# Patient Record
Sex: Female | Born: 1990
Health system: Southern US, Community
[De-identification: ages and names within clinical notes are randomized; demographics above are authoritative.]

## PROBLEM LIST (undated history)

## (undated) ENCOUNTER — Inpatient Hospital Stay (HOSPITAL_COMMUNITY): Payer: Self-pay

## (undated) DIAGNOSIS — E669 Obesity, unspecified: Secondary | ICD-10-CM

## (undated) DIAGNOSIS — K8020 Calculus of gallbladder without cholecystitis without obstruction: Secondary | ICD-10-CM

## (undated) DIAGNOSIS — A599 Trichomoniasis, unspecified: Secondary | ICD-10-CM

## (undated) HISTORY — PX: NO PAST SURGERIES: SHX2092

---
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
## 2011-04-13 ENCOUNTER — Emergency Department (INDEPENDENT_AMBULATORY_CARE_PROVIDER_SITE_OTHER)
Admission: EM | Admit: 2011-04-13 | Discharge: 2011-04-13 | Disposition: A | Discharge status: Home / Self Care | Payer: Self-pay | Source: Home / Self Care | Attending: Family Medicine | Admitting: Family Medicine

## 2011-04-13 ENCOUNTER — Encounter (HOSPITAL_COMMUNITY): Payer: Self-pay | Admitting: *Deleted

## 2011-04-13 DIAGNOSIS — A499 Bacterial infection, unspecified: Secondary | ICD-10-CM

## 2011-04-13 DIAGNOSIS — N760 Acute vaginitis: Secondary | ICD-10-CM

## 2011-04-13 DIAGNOSIS — B9689 Other specified bacterial agents as the cause of diseases classified elsewhere: Secondary | ICD-10-CM

## 2011-04-13 LAB — POCT URINALYSIS DIP (DEVICE)
Glucose, UA: NEGATIVE mg/dL
Nitrite: NEGATIVE
Urobilinogen, UA: 0.2 mg/dL (ref 0.0–1.0)

## 2011-04-13 LAB — POCT PREGNANCY, URINE: Preg Test, Ur: NEGATIVE

## 2011-04-13 LAB — WET PREP, GENITAL

## 2011-04-13 MED ORDER — FLUCONAZOLE 150 MG PO TABS: 150.0000 mg | ORAL_TABLET | Freq: Once | ORAL | Status: AC

## 2011-04-13 MED ORDER — METRONIDAZOLE 500 MG PO TABS: 500.0000 mg | ORAL_TABLET | Freq: Two times a day (BID) | ORAL | Status: AC

## 2011-04-13 NOTE — ED Provider Notes (Signed)
History     CSN: [id number]  Arrival date & time 04/13/11  0828   First MD Initiated Contact with Patient 04/13/11 [phone]      Chief Complaint  Patient presents with  . SEXUALLY TRANSMITTED DISEASE    (Consider location/radiation/quality/duration/timing/severity/associated sxs/prior treatment) HPI Comments: Alyssa Goodman presents for evaluation for a pregnancy test and for vaginal discharge. She reports unprotected sexual intercourse with a known acquaintance on January 5. She denies any dysuria, abdominal pain. No fever. She does report minor vaginal spotting. Her last menstrual period was January 20.  Patient is a 21 y.o. female presenting with vaginal discharge. The history is provided by the patient.  Vaginal Discharge This is a new problem. The current episode started more than 1 week ago. The problem has not changed since onset.The symptoms are aggravated by nothing. The symptoms are relieved by nothing.    Past Medical History  Diagnosis Date  . Asthma     History reviewed. No pertinent past surgical history.  No family history on file.  History  Substance Use Topics  . Smoking status: Never Smoker   . Smokeless tobacco: Not on file  . Alcohol Use: No    OB History    Grav Para Term Preterm Abortions TAB SAB Ect Mult Living                  Review of Systems  Constitutional: Negative.   HENT: Negative.   Eyes: Negative.   Respiratory: Negative.   Cardiovascular: Negative.   Gastrointestinal: Negative.   Genitourinary: Positive for vaginal discharge. Negative for dysuria, urgency, frequency, hematuria and vaginal pain.  Musculoskeletal: Negative.   Skin: Negative.   Neurological: Negative.     Allergies  Review of patient's allergies indicates no known allergies.  Home Medications   Current Outpatient Rx  Name Route Sig Dispense Refill  . ALBUTEROL SULFATE HFA IN Inhalation Inhale into the lungs as needed.    Marland Kitchen FLUCONAZOLE 150 MG PO TABS Oral Take 1 tablet  (150 mg total) by mouth once. Take one pill once. May repeat if symptoms persist after 3rd day. 2 tablet 0  . METRONIDAZOLE 500 MG PO TABS Oral Take 1 tablet (500 mg total) by mouth 2 (two) times daily. 14 tablet 0    BP 128/81  Pulse 75  Temp(Src) 97.8 F (36.6 C) (Oral)  Resp 16  SpO2 98%  LMP 03/27/2011  Physical Exam  Nursing note and vitals reviewed. Constitutional: She is oriented to person, place, and time. She appears well-developed and well-nourished.  HENT:  Head: Normocephalic and atraumatic.  Eyes: EOM are normal.  Neck: Normal range of motion.  Pulmonary/Chest: Effort normal.  Genitourinary: Cervix exhibits no discharge. Vaginal discharge found.  Musculoskeletal: Normal range of motion.  Neurological: She is alert and oriented to person, place, and time.  Skin: Skin is warm and dry.  Psychiatric: Her behavior is normal.    ED Course  Procedures (including critical care time)  Labs Reviewed  POCT URINALYSIS DIP (DEVICE) - Abnormal; Notable for the following:    Hgb urine dipstick SMALL (*)    Protein, ur 30 (*)    Leukocytes, UA SMALL (*) Biochemical Testing Only. Please order routine urinalysis from main lab if confirmatory testing is needed.   All other components within normal limits  POCT PREGNANCY, URINE  GC/CHLAMYDIA PROBE AMP, GENITAL  WET PREP, GENITAL   No results found.   1. Bacterial vaginosis       MDM  Empirically  treated with metronidazole; labs and cultures pending        Richardo Priest, MD 04/13/11 [phone]

## 2011-04-13 NOTE — ED Notes (Signed)
Pt states she wants to be checked for pregnancy and STD.  States she has been intermittently spotting for past week and also has a vaginal discharge with a slight odor.  Sexually active without use of condoms/birth control.  Denies dysuria or low abd pain.

## 2011-04-14 LAB — GC/CHLAMYDIA PROBE AMP, GENITAL: GC Probe Amp, Genital: NEGATIVE

## 2011-04-14 NOTE — ED Notes (Signed)
GC/Chlamydia neg., Wet prep: Few clue cells, many WBC's.  Pt. adequately treated with Flagyl.  Vassie Moselle 04/14/2011

## 2011-07-27 ENCOUNTER — Emergency Department (HOSPITAL_BASED_OUTPATIENT_CLINIC_OR_DEPARTMENT_OTHER)
Admission: EM | Admit: 2011-07-27 | Discharge: 2011-07-27 | Disposition: A | Discharge status: Home / Self Care | Payer: Medicaid Other | Attending: Emergency Medicine | Admitting: Emergency Medicine

## 2011-07-27 ENCOUNTER — Encounter (HOSPITAL_BASED_OUTPATIENT_CLINIC_OR_DEPARTMENT_OTHER): Payer: Self-pay

## 2011-07-27 DIAGNOSIS — J45909 Unspecified asthma, uncomplicated: Secondary | ICD-10-CM | POA: Insufficient documentation

## 2011-07-27 DIAGNOSIS — A599 Trichomoniasis, unspecified: Secondary | ICD-10-CM | POA: Insufficient documentation

## 2011-07-27 DIAGNOSIS — N390 Urinary tract infection, site not specified: Secondary | ICD-10-CM

## 2011-07-27 LAB — URINE MICROSCOPIC-ADD ON

## 2011-07-27 LAB — URINALYSIS, ROUTINE W REFLEX MICROSCOPIC
Glucose, UA: NEGATIVE mg/dL
Specific Gravity, Urine: 1.028 (ref 1.005–1.030)
Urobilinogen, UA: 0.2 mg/dL (ref 0.0–1.0)
pH: 6 (ref 5.0–8.0)

## 2011-07-27 LAB — PREGNANCY, URINE: Preg Test, Ur: NEGATIVE

## 2011-07-27 LAB — WET PREP, GENITAL

## 2011-07-27 MED ORDER — METRONIDAZOLE 500 MG PO TABS
2000.0000 mg | ORAL_TABLET | Freq: Once | ORAL | Status: AC
  Administered 2011-07-27: 2000 mg via ORAL
  Filled 2011-07-27: qty 4

## 2011-07-27 MED ORDER — CEFTRIAXONE SODIUM 250 MG IJ SOLR
250.0000 mg | Freq: Once | INTRAMUSCULAR | Status: AC
  Administered 2011-07-27: 250 mg via INTRAMUSCULAR
  Filled 2011-07-27: qty 250

## 2011-07-27 MED ORDER — AZITHROMYCIN 250 MG PO TABS
1000.0000 mg | ORAL_TABLET | Freq: Once | ORAL | Status: AC
  Administered 2011-07-27: 1000 mg via ORAL
  Filled 2011-07-27: qty 4

## 2011-07-27 MED ORDER — SULFAMETHOXAZOLE-TRIMETHOPRIM 800-160 MG PO TABS: 1.0000 | ORAL_TABLET | Freq: Two times a day (BID) | ORAL | Status: AC

## 2011-07-27 NOTE — ED Provider Notes (Signed)
History     CSN: [id number]  Arrival date & time 07/27/11  1335   First MD Initiated Contact with Patient 07/27/11 1344      Chief Complaint  Patient presents with  . Dysuria    (Consider location/radiation/quality/duration/timing/severity/associated sxs/prior treatment) HPI Comments: Pt states that she has had a smell and a discharge for a while Pt states that she has not been sexually active in a couple of minutes  Patient is a 21 y.o. female presenting with dysuria. The history is provided by the patient. No language interpreter was used.  Dysuria  This is a new problem. The current episode started 2 days ago. The problem occurs intermittently. The problem has been resolved. The quality of the pain is described as burning. The patient is experiencing no pain. There has been no fever. She is sexually active. Associated symptoms include discharge.    Past Medical History  Diagnosis Date  . Asthma     History reviewed. No pertinent past surgical history.  No family history on file.  History  Substance Use Topics  . Smoking status: Never Smoker   . Smokeless tobacco: Not on file  . Alcohol Use: No    OB History    Grav Para Term Preterm Abortions TAB SAB Ect Mult Living                  Review of Systems  Respiratory: Negative.   Cardiovascular: Negative.   Genitourinary: Positive for dysuria.    Allergies  Review of patient's allergies indicates no known allergies.  Home Medications   Current Outpatient Rx  Name Route Sig Dispense Refill  . ALBUTEROL SULFATE HFA IN Inhalation Inhale into the lungs as needed.      BP 128/77  Pulse 74  Temp(Src) 98.3 F (36.8 C) (Oral)  Resp 16  SpO2 100%  LMP 06/27/2011  Physical Exam  Nursing note and vitals reviewed. Constitutional: She is oriented to person, place, and time. She appears well-developed and well-nourished.  HENT:  Head: Normocephalic and atraumatic.  Eyes: Conjunctivae and EOM are normal.    Neck: Neck supple.  Cardiovascular: Normal rate and regular rhythm.   Pulmonary/Chest: Effort normal and breath sounds normal.  Abdominal: Soft. Bowel sounds are normal. There is no tenderness.  Genitourinary:       Yellow vaginal discharge:-cmt  Musculoskeletal: Normal range of motion.  Neurological: She is alert and oriented to person, place, and time.    ED Course  Procedures (including critical care time)  Labs Reviewed  URINALYSIS, ROUTINE W REFLEX MICROSCOPIC - Abnormal; Notable for the following:    APPearance TURBID (*)    Hgb urine dipstick MODERATE (*)    Protein, ur 30 (*)    Leukocytes, UA LARGE (*)    All other components within normal limits  WET PREP, GENITAL - Abnormal; Notable for the following:    Trich, Wet Prep FEW (*)    Clue Cells Wet Prep HPF POC FEW (*)    WBC, Wet Prep HPF POC TOO NUMEROUS TO COUNT (*)    All other components within normal limits  URINE MICROSCOPIC-ADD ON - Abnormal; Notable for the following:    Squamous Epithelial / LPF MANY (*)    Bacteria, UA MANY (*)    All other components within normal limits  PREGNANCY, URINE  GC/CHLAMYDIA PROBE AMP, GENITAL   No results found.   1. Trichimoniasis   2. UTI (lower urinary tract infection)  MDM  Pt treated for std here based on exam:educated on std        Teressa Lower, NP 07/27/11 1456

## 2011-07-27 NOTE — ED Notes (Signed)
Dysuria 2 days ago-denies at present

## 2011-07-27 NOTE — Discharge Instructions (Signed)
Urinary Tract Infection Infections of the urinary tract can start in several places. A bladder infection (cystitis), a kidney infection (pyelonephritis), and a prostate infection (prostatitis) are different types of urinary tract infections (UTIs). They usually get better if treated with medicines (antibiotics) that kill germs. Take all the medicine until it is gone. You or your child may feel better in a few days, but TAKE ALL MEDICINE or the infection may not respond and may become more difficult to treat. HOME CARE INSTRUCTIONS   Drink enough water and fluids to keep the urine clear or pale yellow. Cranberry juice is especially recommended, in addition to large amounts of water.   Avoid caffeine, tea, and carbonated beverages. They tend to irritate the bladder.   Alcohol may irritate the prostate.   Only take over-the-counter or prescription medicines for pain, discomfort, or fever as directed by your caregiver.  To prevent further infections:  Empty the bladder often. Avoid holding urine for long periods of time.   After a bowel movement, women should cleanse from front to back. Use each tissue only once.   Empty the bladder before and after sexual intercourse.  FINDING OUT THE RESULTS OF YOUR TEST Not all test results are available during your visit. If your or your child's test results are not back during the visit, make an appointment with your caregiver to find out the results. Do not assume everything is normal if you have not heard from your caregiver or the medical facility. It is important for you to follow up on all test results. SEEK MEDICAL CARE IF:   There is back pain.   Your baby is older than 3 months with a rectal temperature of 100.5 F (38.1 C) or higher for more than 1 day.   Your or your child's problems (symptoms) are no better in 3 days. Return sooner if you or your child is getting worse.  SEEK IMMEDIATE MEDICAL CARE IF:   There is severe back pain or lower  abdominal pain.   You or your child develops chills.   You have a fever.   Your baby is older than 3 months with a rectal temperature of 102 F (38.9 C) or higher.   Your baby is 3 months old or younger with a rectal temperature of 100.4 F (38 C) or higher.   There is nausea or vomiting.   There is continued burning or discomfort with urination.  MAKE SURE YOU:   Understand these instructions.   Will watch your condition.   Will get help right away if you are not doing well or get worse.  Document Released: 12/01/2004 Document Revised: 02/10/2011 Document Reviewed: 07/06/2006 [employer] Patient Information 2012 [employer]  Trichomoniasis Trichomoniasis is an infection, caused by the Trichomonas organism, that affects both women and men. In women, the outer female genitalia and the vagina are affected. In men, the penis is mainly affected, but the prostate and other reproductive organs can also be involved. Trichomoniasis is a sexually transmitted disease (STD) and is most often passed to another person through sexual contact. The majority of people who get trichomoniasis do so from a sexual encounter and are also at risk for other STDs. CAUSES   Sexual intercourse with an infected partner.   It can be present in swimming pools or hot tubs.  SYMPTOMS   Abnormal gray-green frothy vaginal discharge in women.   Vaginal itching and irritation in women.   Itching and irritation of the area outside the vagina   in women.   Penile discharge with or without pain in males.   Inflammation of the urethra (urethritis), causing painful urination.   Bleeding after sexual intercourse.  RELATED COMPLICATIONS  Pelvic inflammatory disease.   Infection of the uterus (endometritis).   Infertility.   Tubal (ectopic) pregnancy.   It can be associated with other STDs, including gonorrhea and chlamydia, hepatitis B, and HIV.  COMPLICATIONS DURING PREGNANCY  Early (premature)  delivery.   Premature rupture of the membranes (PROM).   Low birth weight.  DIAGNOSIS   Visualization of Trichomonas under the microscope from the vagina discharge.   Ph of the vagina greater than 4.5, tested with a test tape.   Trich Rapid Test.   Culture of the organism, but this is not usually needed.   It may be found on a Pap test.   Having a "strawberry cervix,"which means the cervix looks very red like a strawberry.  TREATMENT   You may be given medication to fight the infection. Inform your caregiver if you could be or are pregnant. Some medications used to treat the infection should not be taken during pregnancy.   Over-the-counter medications or creams to decrease itching or irritation may be recommended.   Your sexual partner will need to be treated if infected.  HOME CARE INSTRUCTIONS   Take all medication prescribed by your caregiver.   Take over-the-counter medication for itching or irritation as directed by your caregiver.   Do not have sexual intercourse while you have the infection.   Do not douche or wear tampons.   Discuss your infection with your partner, as your partner may have acquired the infection from you. Or, your partner may have been the person who transmitted the infection to you.   Have your sex partner examined and treated if necessary.   Practice safe, informed, and protected sex.   See your caregiver for other STD testing.  SEEK MEDICAL CARE IF:   You still have symptoms after you finish the medication.   You have an oral temperature above 102 F (38.9 C).   You develop belly (abdominal) pain.   You have pain when you urinate.   You have bleeding after sexual intercourse.   You develop a rash.   The medication makes you sick or makes you throw up (vomit).  Document Released: 08/17/2000 Document Revised: 02/10/2011 Document Reviewed: 09/12/2008 [employer] Patient Information 2012 [employer]. 

## 2011-07-28 LAB — GC/CHLAMYDIA PROBE AMP, GENITAL: GC Probe Amp, Genital: NEGATIVE

## 2011-07-29 NOTE — ED Provider Notes (Signed)
Medical screening examination/treatment/procedure(s) were performed by non-physician practitioner and as supervising physician I was immediately available for consultation/collaboration.  Sheriece Jefcoat, MD 07/29/11 0537 

## 2012-05-19 ENCOUNTER — Emergency Department (HOSPITAL_COMMUNITY)
Admission: EM | Admit: 2012-05-19 | Discharge: 2012-05-19 | Disposition: A | Discharge status: Home / Self Care | Payer: Medicaid Other | Attending: Emergency Medicine | Admitting: Emergency Medicine

## 2012-05-19 ENCOUNTER — Encounter (HOSPITAL_COMMUNITY): Payer: Self-pay | Admitting: Emergency Medicine

## 2012-05-19 DIAGNOSIS — J069 Acute upper respiratory infection, unspecified: Secondary | ICD-10-CM | POA: Insufficient documentation

## 2012-05-19 DIAGNOSIS — J45909 Unspecified asthma, uncomplicated: Secondary | ICD-10-CM | POA: Insufficient documentation

## 2012-05-19 DIAGNOSIS — R51 Headache: Secondary | ICD-10-CM | POA: Insufficient documentation

## 2012-05-19 LAB — RAPID STREP SCREEN (MED CTR MEBANE ONLY): Streptococcus, Group A Screen (Direct): NEGATIVE

## 2012-05-19 MED ORDER — METOCLOPRAMIDE HCL 10 MG PO TABS: 10.0000 mg | ORAL_TABLET | Freq: Four times a day (QID) | ORAL | Status: DC | PRN

## 2012-05-19 MED ORDER — OXYCODONE-ACETAMINOPHEN 5-325 MG PO TABS: 2.0000 | ORAL_TABLET | ORAL | Status: DC | PRN

## 2012-05-19 MED ORDER — ONDANSETRON 8 MG PO TBDP: ORAL_TABLET | ORAL | Status: DC

## 2012-05-19 MED ORDER — OXYCODONE-ACETAMINOPHEN 5-325 MG PO TABS
2.0000 | ORAL_TABLET | Freq: Once | ORAL | Status: AC
  Administered 2012-05-19: 2 via ORAL
  Filled 2012-05-19: qty 2

## 2012-05-19 MED ORDER — PREDNISONE 20 MG PO TABS: ORAL_TABLET | ORAL | Status: DC

## 2012-05-19 NOTE — ED Notes (Signed)
Pt c/o sore throat and headache x's 3 days.  Nausea without vomiting

## 2012-05-19 NOTE — ED Provider Notes (Signed)
History     CSN: [id number]  Arrival date & time 05/19/12  1757   First MD Initiated Contact with Patient 05/19/12 2029      Chief Complaint  Patient presents with  . Sore Throat    (Consider location/radiation/quality/duration/timing/severity/associated sxs/prior treatment) HPI This 22 year old female has a few days of a sore throat nasal congestion cough generalized body aches generalized weakness and nausea without vomiting without abdominal pain without diarrhea , she does have a mild headache but no severe headache no stiff neck no confusion no change in speech or vision no focal weakness or numbness no rashes no shortness of breath and she is able to tolerate oral fluids. There is no treatment prior to arrival. Past Medical History  Diagnosis Date  . Asthma     History reviewed. No pertinent past surgical history.  No family history on file.  History  Substance Use Topics  . Smoking status: Never Smoker   . Smokeless tobacco: Not on file  . Alcohol Use: No    OB History   Grav Para Term Preterm Abortions TAB SAB Ect Mult Living                  Review of Systems 10 Systems reviewed and are negative for acute change except as noted in the HPI. Allergies  Review of patient's allergies indicates no known allergies.  Home Medications   Current Outpatient Rx  Name  Route  Sig  Dispense  Refill  . metoCLOPramide (REGLAN) 10 MG tablet   Oral   Take 1 tablet (10 mg total) by mouth every 6 (six) hours as needed (nausea/headache).   6 tablet   0   . ondansetron (ZOFRAN ODT) 8 MG disintegrating tablet      8mg  ODT q4 hours prn nausea   4 tablet   0   . oxyCODONE-acetaminophen (PERCOCET) 5-325 MG per tablet   Oral   Take 2 tablets by mouth every 4 (four) hours as needed for pain.   20 tablet   0   . predniSONE (DELTASONE) 20 MG tablet      3 tabs po daily x 2 days   6 tablet   0     BP 131/95  Pulse 89  Temp(Src) 98.3 F (36.8 C) (Oral)  Resp 16   SpO2 96%  LMP 05/03/2012  Physical Exam  Nursing note and vitals reviewed. Constitutional:  Awake, alert, nontoxic appearance.  HENT:  Head: Atraumatic.  Mouth/Throat: No oropharyngeal exudate.  Oropharynx shows some bilateral tonsillar enlargement with erythema but no obvious fluctuance or peritonsillar abscess, uvula is midline there is no stridor no drooling no trismus no airway compromise  Eyes: Right eye exhibits no discharge. Left eye exhibits no discharge.  Neck: Neck supple.  Cardiovascular: Normal rate and regular rhythm.   No murmur heard. Pulmonary/Chest: Effort normal and breath sounds normal. No stridor. No respiratory distress. She has no wheezes. She has no rales. She exhibits no tenderness.  Abdominal: Soft. There is no tenderness. There is no rebound.  Musculoskeletal: She exhibits no tenderness.  Baseline ROM, no obvious new focal weakness.  Lymphadenopathy:    She has no cervical adenopathy.  Neurological:  Mental status and motor strength appears baseline for patient and situation.  Skin: No rash noted.  Psychiatric: She has a normal mood and affect.    ED Course  Procedures (including critical care time)  Labs Reviewed  RAPID STREP SCREEN   No results found.   1.  URI (upper respiratory infection)       MDM  I doubt any other EMC precluding discharge at this time including, but not necessarily limited to the following:SBI, PTA.        Hurman Horn, MD 05/20/12 0110

## 2012-06-27 ENCOUNTER — Ambulatory Visit: Payer: Medicaid Other

## 2012-06-27 ENCOUNTER — Encounter: Payer: Self-pay | Admitting: Internal Medicine

## 2012-06-27 ENCOUNTER — Ambulatory Visit (INDEPENDENT_AMBULATORY_CARE_PROVIDER_SITE_OTHER): Payer: Medicaid Other | Admitting: Internal Medicine

## 2012-06-27 ENCOUNTER — Other Ambulatory Visit (HOSPITAL_COMMUNITY)
Admission: RE | Admit: 2012-06-27 | Discharge: 2012-06-27 | Disposition: A | Discharge status: Home / Self Care | Payer: Medicaid Other | Source: Ambulatory Visit | Attending: Internal Medicine | Admitting: Internal Medicine

## 2012-06-27 VITALS — BP 117/83 | HR 66 | Temp 97.4°F | Ht 61.0 in | Wt 274.7 lb

## 2012-06-27 DIAGNOSIS — Z113 Encounter for screening for infections with a predominantly sexual mode of transmission: Secondary | ICD-10-CM | POA: Insufficient documentation

## 2012-06-27 DIAGNOSIS — J45909 Unspecified asthma, uncomplicated: Secondary | ICD-10-CM

## 2012-06-27 DIAGNOSIS — N898 Other specified noninflammatory disorders of vagina: Secondary | ICD-10-CM | POA: Insufficient documentation

## 2012-06-27 DIAGNOSIS — R51 Headache: Secondary | ICD-10-CM

## 2012-06-27 DIAGNOSIS — Z3202 Encounter for pregnancy test, result negative: Secondary | ICD-10-CM

## 2012-06-27 DIAGNOSIS — N760 Acute vaginitis: Secondary | ICD-10-CM | POA: Insufficient documentation

## 2012-06-27 DIAGNOSIS — Z0000 Encounter for general adult medical examination without abnormal findings: Secondary | ICD-10-CM

## 2012-06-27 DIAGNOSIS — G44209 Tension-type headache, unspecified, not intractable: Secondary | ICD-10-CM | POA: Insufficient documentation

## 2012-06-27 MED ORDER — AZITHROMYCIN 500 MG PO TABS: 1000.0000 mg | ORAL_TABLET | Freq: Once | ORAL | Status: DC

## 2012-06-27 MED ORDER — AZITHROMYCIN 500 MG PO TABS: 1000.0000 mg | ORAL_TABLET | Freq: Once | ORAL | Status: AC

## 2012-06-27 NOTE — Patient Instructions (Addendum)
**I will have the results from your vaginal swab hopefully by tomorrow, and will give you a call once I get the results. In the mean time, take Azithromycin 1000mg  by mouth, once.   **Once you have your [facility], I will perform pap smear and full pelvic exam, do blood work, and check your pulmonary function.    Asthma Attack Prevention HOW CAN ASTHMA BE PREVENTED? Currently, there is no way to prevent asthma from starting. However, you can take steps to control the disease and prevent its symptoms after you have been diagnosed. Learn about your asthma and how to control it. Take an active role to control your asthma by working with your caregiver to create and follow an asthma action plan. An asthma action plan guides you in taking your medicines properly, avoiding factors that make your asthma worse, tracking your level of asthma control, responding to worsening asthma, and seeking emergency care when needed. To track your asthma, keep records of your symptoms, check your peak flow number using a peak flow meter (handheld device that shows how well air moves out of your lungs), and get regular asthma checkups.  Other ways to prevent asthma attacks include:  Use medicines as your caregiver directs.  Identify and avoid things that make your asthma worse (as much as you can).  Keep track of your asthma symptoms and level of control.  Get regular checkups for your asthma.  With your caregiver, write a detailed plan for taking medicines and managing an asthma attack. Then be sure to follow your action plan. Asthma is an ongoing condition that needs regular monitoring and treatment.  Identify and avoid asthma triggers. A number of outdoor allergens and irritants (pollen, mold, cold air, air pollution) can trigger asthma attacks. Find out what causes or makes your asthma worse, and take steps to avoid those triggers (see below).  Monitor your breathing. Learn to recognize warning signs of an  attack, such as slight coughing, wheezing or shortness of breath. However, your lung function may already decrease before you notice any signs or symptoms, so regularly measure and record your peak airflow with a home peak flow meter.  Identify and treat attacks early. If you act quickly, you're less likely to have a severe attack. You will also need less medicine to control your symptoms. When your peak flow measurements decrease and alert you to an upcoming attack, take your medicine as instructed, and immediately stop any activity that may have triggered the attack. If your symptoms do not improve, get medical help.  Pay attention to increasing quick-relief inhaler use. If you find yourself relying on your quick-relief inhaler (such as albuterol), your asthma is not under control. See your caregiver about adjusting your treatment. IDENTIFY AND CONTROL FACTORS THAT MAKE YOUR ASTHMA WORSE A number of common things can set off or make your asthma symptoms worse (asthma triggers). Keep track of your asthma symptoms for several weeks, detailing all the environmental and emotional factors that are linked with your asthma. When you have an asthma attack, go back to your asthma diary to see which factor, or combination of factors, might have contributed to it. Once you know what these factors are, you can take steps to control many of them.  Allergies: If you have allergies and asthma, it is important to take asthma prevention steps at home. Asthma attacks (worsening of asthma symptoms) can be triggered by allergies, which can cause temporary increased inflammation of your airways. Minimizing contact with the  substance to which you are allergic will help prevent an asthma attack. Animal Dander:   Some people are allergic to the flakes of skin or dried saliva from animals with fur or feathers. Keep these pets out of your home.  If you can't keep a pet outdoors, keep the pet out of your bedroom and other  sleeping areas at all times, and keep the door closed.  Remove carpets and furniture covered with cloth from your home. If that is not possible, keep the pet away from fabric-covered furniture and carpets. Dust Mites:  Many people with asthma are allergic to dust mites. Dust mites are tiny bugs that are found in every home, in mattresses, pillows, carpets, fabric-covered furniture, bedcovers, clothes, stuffed toys, fabric, and other fabric-covered items.  Cover your mattress in a special dust-proof cover.  Cover your pillow in a special dust-proof cover, or wash the pillow each week in hot water. Water must be hotter than 130 F to kill dust mites. Cold or warm water used with detergent and bleach can also be effective.  Wash the sheets and blankets on your bed each week in hot water.  Try not to sleep or lie on cloth-covered cushions.  Call ahead when traveling and ask for a smoke-free hotel room. Bring your own bedding and pillows, in case the hotel only supplies feather pillows and down comforters, which may contain dust mites and cause asthma symptoms.  Remove carpets from your bedroom and those laid on concrete, if you can.  Keep stuffed toys out of the bed, or wash the toys weekly in hot water or cooler water with detergent and bleach. Cockroaches:  Many people with asthma are allergic to the droppings and remains of cockroaches.  Keep food and garbage in closed containers. Never leave food out.  Use poison baits, traps, powders, gels, or paste (for example, boric acid).  If a spray is used to kill cockroaches, stay out of the room until the odor goes away. Indoor Mold:  Fix leaky faucets, pipes, or other sources of water that have mold around them.  Clean moldy surfaces with a cleaner that has bleach in it. Pollen and Outdoor Mold:  When pollen or mold spore counts are high, try to keep your windows closed.  Stay indoors with windows closed from late morning to afternoon,  if you can. Pollen and some mold spore counts are highest at that time.  Ask your caregiver whether you need to take or increase anti-inflammatory medicine before your allergy season starts. Irritants:   Tobacco smoke is an irritant. If you smoke, ask your caregiver how you can quit. Ask family members to quit smoking, too. Do not allow smoking in your home or car.  If possible, do not use a wood-burning stove, kerosene heater, or fireplace. Minimize exposure to all sources of smoke, including incense, candles, fires, and fireworks.  Try to stay away from strong odors and sprays, such as perfume, talcum powder, hair spray, and paints.  Decrease humidity in your home and use an indoor air cleaning device. Reduce indoor humidity to below 60 percent. Dehumidifiers or central air conditioners can do this.  Try to have someone else vacuum for you once or twice a week, if you can. Stay out of rooms while they are being vacuumed and for a short while afterward.  If you vacuum, use a dust mask from a hardware store, a double-layered or microfilter vacuum cleaner bag, or a vacuum cleaner with a HEPA filter.  Sulfites in foods and beverages can be irritants. Do not drink beer or wine, or eat dried fruit, processed potatoes, or shrimp if they cause asthma symptoms.  Cold air can trigger an asthma attack. Cover your nose and mouth with a scarf on cold or windy days.  Several health conditions can make asthma more difficult to manage, including runny nose, sinus infections, reflux disease, psychological stress, and sleep apnea. Your caregiver will treat these conditions, as well.  Avoid close contact with people who have a cold or the flu, since your asthma symptoms may get worse if you catch the infection from them. Wash your hands thoroughly after touching items that may have been handled by people with a respiratory infection.  Get a flu shot every year to protect against the flu virus, which often  makes asthma worse for days or weeks. Also get a pneumonia shot once every five to 10 years. Drugs:  Aspirin and other painkillers can cause asthma attacks. 10% to 20% of people with asthma have sensitivity to aspirin or a group of painkillers called non-steroidal anti-inflammatory drugs (NSAIDS), such as ibuprofen and naproxen. These drugs are used to treat pain and reduce fevers. Asthma attacks caused by any of these medicines can be severe and even fatal. These drugs must be avoided in people who have known aspirin sensitive asthma. Products with acetaminophen are considered safe for people who have asthma. It is important that people with aspirin sensitivity read labels of all over-the-counter drugs used to treat pain, colds, coughs, and fever.  Beta blockers and ACE inhibitors are other drugs which you should discuss with your caregiver, in relation to your asthma. ALLERGY SKIN TESTING  Ask your asthma caregiver about allergy skin testing or blood testing (RAST test) to identify the allergens to which you are sensitive. If you are found to have allergies, allergy shots (immunotherapy) for asthma may help prevent future allergies and asthma. With allergy shots, small doses of allergens (substances to which you are allergic) are injected under your skin on a regular schedule. Over a period of time, your body may become used to the allergen and less responsive with asthma symptoms. You can also take measures to minimize your exposure to those allergens. EXERCISE  If you have exercise-induced asthma, or are planning vigorous exercise, or exercise in cold, humid, or dry environments, prevent exercise-induced asthma by following your caregiver's advice regarding asthma treatment before exercising. Document Released: 02/09/2009 Document Revised: 05/16/2011 Document Reviewed: 02/09/2009 [facility] Patient Information 2013 [place], [place].

## 2012-06-28 ENCOUNTER — Encounter: Payer: Self-pay | Admitting: Internal Medicine

## 2012-06-28 DIAGNOSIS — Z0000 Encounter for general adult medical examination without abnormal findings: Secondary | ICD-10-CM | POA: Insufficient documentation

## 2012-06-28 MED ORDER — METRONIDAZOLE 500 MG PO TABS: 500.0000 mg | ORAL_TABLET | Freq: Two times a day (BID) | ORAL | Status: AC

## 2012-06-28 NOTE — Assessment & Plan Note (Addendum)
She is applying for the orange card, and once she gets that, will perform a Pap smear so she does not have to pay out of pocket. I will also check labs on her at that time, and she will likely need PFTs, as I'm not sure that she's had any in the past.

## 2012-06-28 NOTE — Progress Notes (Signed)
Patient ID: Alyssa Goodman, female   DOB: 1990/04/09, 22 y.o.   MRN: [id number]  Subjective:   Patient ID: Alyssa Goodman female   DOB: Sep 07, 1990 22 y.o.   MRN: [id number]  HPI: Ms.Alyssa Goodman is a 22 y.o. female with past history of asthma and a history of trichomonas and BV presents to [facility] as a new patient. Next  She's history of asthma and uses only Proventil inhaler as needed, using it about 2 times a week when she feels short of breath or his wheezing. She says her symptoms are worse in the summer when it's hot outside or with exercise. She states that it she cannot members she's ever had PFTs. Next  She does have a history of headaches that began which is a sixth-grade. They're relieved with over-the-counter medication and primarily occur at her left temple. Next  She complains today of vaginal itching and burning with a white cheesy discharge is been occurring for the past few months. She states that she's not currently sexually active her last sexual partner was in December 2013, when she did use a condom. She states that when she has sex she uses condoms. Her last Pap smear was over year ago. She has a history of trichomonas 5/22/13and BV 04/13/11; which were treated with metronidazole.   Past Medical History  Diagnosis Date  . Asthma    Current Outpatient Prescriptions  Medication Sig Dispense Refill  . azithromycin (ZITHROMAX) 500 MG tablet Take 2 tablets (1,000 mg total) by mouth once. Take 1 tablet daily for 3 days.  2 tablet  0  . metoCLOPramide (REGLAN) 10 MG tablet Take 1 tablet (10 mg total) by mouth every 6 (six) hours as needed (nausea/headache).  6 tablet  0  . metroNIDAZOLE (FLAGYL) 500 MG tablet Take 1 tablet (500 mg total) by mouth 2 (two) times daily.  14 tablet  0  . ondansetron (ZOFRAN ODT) 8 MG disintegrating tablet 8mg  ODT q4 hours prn nausea  4 tablet  0  . oxyCODONE-acetaminophen (PERCOCET) 5-325 MG per tablet Take 2 tablets by mouth every 4 (four) hours as needed  for pain.  20 tablet  0  . predniSONE (DELTASONE) 20 MG tablet 3 tabs po daily x 2 days  6 tablet  0   No current facility-administered medications for this visit.   No family history on file. History   Social History  . Marital Status: Single    Spouse Name: N/A    Number of Children: N/A  . Years of Education: N/A   Social History Main Topics  . Smoking status: Former Games developer  . Smokeless tobacco: None  . Alcohol Use: No  . Drug Use: No  . Sexually Active: None   Other Topics Concern  . None   Social History Narrative  . None   Review of Systems: A 10 point ROS was performed; pertinent positives and negatives were noted in the HPI   Objective:  Physical Exam: Filed Vitals:   06/27/12 1042  BP: 117/83  Pulse: 66  Temp: 97.4 F (36.3 C)  TempSrc: Oral  Height: 5\' 1"  (1.549 m)  Weight: 274 lb 11.2 oz (124.603 kg)  SpO2: 99%   Constitutional: Vital signs reviewed.  Patient is a morbidly obese female in no acute distress and cooperative with exam.   Head: Normocephalic and atraumatic Ear: TM normal bilaterally Mouth: no erythema or exudates, MMM Eyes: PERRL, EOMI, conjunctivae normal, No scleral icterus.  Neck: Supple, Trachea midline normal  ROM, No JVD, mass, thyromegaly Cardiovascular: RRR, S1 normal, S2 normal, no MRG, pulses symmetric and intact bilaterally Pulmonary/Chest: CTAB, no wheezes, rales, or rhonchi Abdominal: Obese. Soft. Non-tender, non-distended, no masses, organomegaly, or guarding present.  GU: Foul smelling, thin, yellow discharge from vagina Musculoskeletal: No joint deformities, erythema, or stiffness, ROM full and no nontender Hematology: No cervical adenopathy.  Neurological: A&O x3, Strength is normal and symmetric bilaterally, cranial nerve II-XII are grossly intact, no focal motor deficit, sensory intact to light touch bilaterally.  Skin: Warm, dry and intact. No rash, cyanosis, or clubbing.  Psychiatric: Normal mood and affect.    Assessment & Plan:   Please refer to Problem List based Assessment and Plan

## 2012-06-28 NOTE — Assessment & Plan Note (Addendum)
Per patient, appears to be well controlled with over-the-counter medication. Continue to monitor.

## 2012-06-28 NOTE — Assessment & Plan Note (Signed)
Patient is morbidly obese. She does not perform exercise. I will refer her to [facility] for nutrition counseling once she has the orange card.

## 2012-06-28 NOTE — Progress Notes (Signed)
Patient ID: Alyssa Goodman, female   DOB: 30-Jun-1990, 22 y.o.   MRN: [id number] I called Ms. Schrecengost and left a message on her personal cell phone regarding her wet prep findings positive for BV and Trichomonas. I notified that I e-prescribed Flagly 500mg  po BID x7 days, and that she should contact her last sexual partner regarding the Trichomonas, as it is an STD.

## 2012-06-28 NOTE — Assessment & Plan Note (Signed)
Currently uses an albuterol inhaler around 2 times a week for shortness of breath or wheezing. Since her symptoms are worse in the summer with the heat or with exercise. She states she rarely has nocturnal symptoms. Because her asthma appears to be well-controlled at this time we'll continue with the albuterol inhaler as needed.

## 2012-06-28 NOTE — Assessment & Plan Note (Addendum)
Patient complains of white cheesy discharge, however on exam discharge was within and yellowish in color. Wet prep was ordered and GC and Chlamydia urinalysis was checked as well. While tests were negative for gonorrhea Chlamydia, wet prep was positive for trichomonas and Gardnerella vaginosis. She was treated prophylactically with 1 g of azithromycin x1 and told to wear cotton underwear and looser fitting clothes. Pregnancy test was negative this visit.  The results from the wet prep came back on 4/24, so I will notify the patient and call in some metronidazole for her to treat the Trichomonas and BV. - Flagyl 500 mg twice daily for seven days

## 2012-07-09 NOTE — Progress Notes (Signed)
INTERNAL MEDICINE TEACHING ATTENDING ADDENDUM - Alyssa Catalina, MD: I reviewed with the resident Dr. Sherrine Maples, Ms. Devinney'  medical history, physical examination, diagnosis and results of tests and treatment and I agree with the patient's care as documented.  Awaiting insurance approval to do further health maintenance and PFTs.

## 2012-07-12 ENCOUNTER — Other Ambulatory Visit (HOSPITAL_COMMUNITY)
Admission: RE | Admit: 2012-07-12 | Discharge: 2012-07-12 | Disposition: A | Discharge status: Home / Self Care | Payer: Medicaid Other | Source: Ambulatory Visit | Attending: Internal Medicine | Admitting: Internal Medicine

## 2012-07-12 ENCOUNTER — Ambulatory Visit (INDEPENDENT_AMBULATORY_CARE_PROVIDER_SITE_OTHER): Payer: Medicaid Other | Admitting: Internal Medicine

## 2012-07-12 ENCOUNTER — Encounter: Payer: Self-pay | Admitting: Internal Medicine

## 2012-07-12 VITALS — BP 121/79 | HR 71 | Temp 97.1°F | Ht 61.0 in | Wt 276.5 lb

## 2012-07-12 DIAGNOSIS — N898 Other specified noninflammatory disorders of vagina: Secondary | ICD-10-CM

## 2012-07-12 DIAGNOSIS — R87810 Cervical high risk human papillomavirus (HPV) DNA test positive: Secondary | ICD-10-CM | POA: Insufficient documentation

## 2012-07-12 DIAGNOSIS — Z0000 Encounter for general adult medical examination without abnormal findings: Secondary | ICD-10-CM

## 2012-07-12 DIAGNOSIS — Z113 Encounter for screening for infections with a predominantly sexual mode of transmission: Secondary | ICD-10-CM | POA: Insufficient documentation

## 2012-07-12 DIAGNOSIS — N760 Acute vaginitis: Secondary | ICD-10-CM | POA: Insufficient documentation

## 2012-07-12 DIAGNOSIS — Z1151 Encounter for screening for human papillomavirus (HPV): Secondary | ICD-10-CM | POA: Insufficient documentation

## 2012-07-12 DIAGNOSIS — Z01419 Encounter for gynecological examination (general) (routine) without abnormal findings: Secondary | ICD-10-CM | POA: Insufficient documentation

## 2012-07-12 DIAGNOSIS — J45909 Unspecified asthma, uncomplicated: Secondary | ICD-10-CM

## 2012-07-12 LAB — GLUCOSE, CAPILLARY: Glucose-Capillary: 80 mg/dL (ref 70–99)

## 2012-07-12 NOTE — Progress Notes (Signed)
Patient ID: Alyssa Goodman, female   DOB: 02/09/1991, 22 y.o.   MRN: [id number]  Subjective:   Patient ID: Alyssa Goodman female   DOB: 24-Aug-1990 22 y.o.   MRN: [id number]  HPI: Ms.Alyssa Goodman is a 22 y.o. F with PMH asthma and recently treated for BV and trichomnas presents to [facility] for a f/u appt.   Pt states that she is doing well overall. Still with some vaginal d/c which has improved.   Using albuterol inh 2x week 2/2 SOB/wheezing.  Brought paperwork for [facility] card today, but deb [place] out until Monday, so she will return then.   Past Medical History  Diagnosis Date  . Asthma    Current Outpatient Prescriptions  Medication Sig Dispense Refill  . albuterol (PROVENTIL HFA;VENTOLIN HFA) 108 (90 BASE) MCG/ACT inhaler Inhale 2 puffs into the lungs every 6 (six) hours as needed for wheezing.      Marland Kitchen oxyCODONE-acetaminophen (PERCOCET) 5-325 MG per tablet Take 2 tablets by mouth every 4 (four) hours as needed for pain.  20 tablet  0  . predniSONE (DELTASONE) 20 MG tablet 3 tabs po daily x 2 days  6 tablet  0   No current facility-administered medications for this visit.   No family history on file. History   Social History  . Marital Status: Single    Spouse Name: N/A    Number of Children: N/A  . Years of Education: N/A   Social History Main Topics  . Smoking status: Former Games developer  . Smokeless tobacco: None  . Alcohol Use: No  . Drug Use: No  . Sexually Active: None   Other Topics Concern  . None   Social History Narrative  . None   Review of Systems: A 10 point ROS was performed; pertinent positives and negatives were noted in the HPI   Objective:  Physical Exam: Filed Vitals:   07/12/12 1428  BP: 121/79  Pulse: 71  Temp: 97.1 F (36.2 C)  TempSrc: Oral  Height: 5\' 1"  (1.549 m)  Weight: 276 lb 8 oz (125.42 kg)  SpO2: 98%   Constitutional: Vital signs reviewed.  Patient is a well-developed and well-nourished female in no acute distress and cooperative with  exam.  Head: Normocephalic and atraumatic Mouth: no erythema or exudates, MMM Eyes: PERRL, EOMI, conjunctivae normal, No scleral icterus.  Cardiovascular: RRR, S1 normal, S2 normal, no MRG, pulses symmetric and intact bilaterally Pulmonary/Chest: CTAB, no wheezes, rales, or rhonchi Abdominal: Obese. Soft. Non-tender, non-distended, bowel sounds are normal, GU: Tenderness on pelvic exam, pt having difficulty relaxing, thick yellow d/c w/o odor in vaginal canal. Difficulty visualizing cervix. Musculoskeletal: No joint deformities, erythema, or stiffness, ROM full and no nontender Neurological: A&O x3, non-focal Skin: Warm, dry and intact. No rash, cyanosis, or clubbing.  Psychiatric: Normal mood and affect.    Assessment & Plan:   Please refer to Problem List based Assessment and Plan

## 2012-07-12 NOTE — Assessment & Plan Note (Addendum)
Treated with Flagyl 500mg  BID x7 days. Vaginal d/c improved, no odor but some d/c present. Some tenderness on exam, pt very tense and nervous- difficulty relaxing. Cytology, wet prep, and GC/Chlamydia performed.  Addendum: GC/Chlamydia, trichomonas, BV, and yeast all negative.  ASC-US on pap smear. Pt will repeat pap smear in 12 months. Patient notified.

## 2012-07-12 NOTE — Assessment & Plan Note (Signed)
Checked CBG which was 81 today. BP good at 121/73

## 2012-07-12 NOTE — Assessment & Plan Note (Signed)
Checked CBG which was 81 today. BP good at 121/73 

## 2012-07-12 NOTE — Patient Instructions (Signed)
**   I will call you with the results of you testing today. If you do not hear from me within 1 week, please call the clinic.

## 2012-07-18 NOTE — Progress Notes (Signed)
Case discussed with Dr. Sherrine Maples (at time of visit, soon after the resident saw the patient).  We reviewed the resident's history and exam and pertinent patient test results.  I agree with the assessment, diagnosis, and plan of care documented in the resident's note.

## 2012-10-13 ENCOUNTER — Encounter (HOSPITAL_COMMUNITY): Payer: Self-pay | Admitting: *Deleted

## 2012-10-13 ENCOUNTER — Emergency Department (HOSPITAL_COMMUNITY)
Admission: EM | Admit: 2012-10-13 | Discharge: 2012-10-13 | Disposition: A | Discharge status: Home / Self Care | Payer: Self-pay | Attending: Emergency Medicine | Admitting: Emergency Medicine

## 2012-10-13 ENCOUNTER — Emergency Department (HOSPITAL_COMMUNITY): Payer: Self-pay

## 2012-10-13 DIAGNOSIS — K8020 Calculus of gallbladder without cholecystitis without obstruction: Secondary | ICD-10-CM | POA: Insufficient documentation

## 2012-10-13 DIAGNOSIS — N390 Urinary tract infection, site not specified: Secondary | ICD-10-CM | POA: Insufficient documentation

## 2012-10-13 DIAGNOSIS — Z79899 Other long term (current) drug therapy: Secondary | ICD-10-CM | POA: Insufficient documentation

## 2012-10-13 DIAGNOSIS — Z3202 Encounter for pregnancy test, result negative: Secondary | ICD-10-CM | POA: Insufficient documentation

## 2012-10-13 DIAGNOSIS — Z87891 Personal history of nicotine dependence: Secondary | ICD-10-CM | POA: Insufficient documentation

## 2012-10-13 DIAGNOSIS — J45909 Unspecified asthma, uncomplicated: Secondary | ICD-10-CM | POA: Insufficient documentation

## 2012-10-13 LAB — CBC WITH DIFFERENTIAL/PLATELET
Eosinophils Absolute: 0.1 10*3/uL (ref 0.0–0.7)
Eosinophils Relative: 1 % (ref 0–5)
HCT: 41 % (ref 36.0–46.0)
Lymphs Abs: 2.7 10*3/uL (ref 0.7–4.0)
MCH: 26.9 pg (ref 26.0–34.0)
MCV: 84.7 fL (ref 78.0–100.0)
Monocytes Absolute: 0.5 10*3/uL (ref 0.1–1.0)
Platelets: 318 10*3/uL (ref 150–400)
RBC: 4.84 MIL/uL (ref 3.87–5.11)
RDW: 14.1 % (ref 11.5–15.5)

## 2012-10-13 LAB — URINE MICROSCOPIC-ADD ON

## 2012-10-13 LAB — COMPREHENSIVE METABOLIC PANEL
AST: 21 U/L (ref 0–37)
Albumin: 3.4 g/dL — ABNORMAL LOW (ref 3.5–5.2)
Alkaline Phosphatase: 53 U/L (ref 39–117)
Chloride: 101 mEq/L (ref 96–112)
Potassium: 3.7 mEq/L (ref 3.5–5.1)
Total Bilirubin: 0.6 mg/dL (ref 0.3–1.2)
Total Protein: 7.4 g/dL (ref 6.0–8.3)

## 2012-10-13 LAB — URINALYSIS, ROUTINE W REFLEX MICROSCOPIC
Bilirubin Urine: NEGATIVE
Ketones, ur: NEGATIVE mg/dL
Nitrite: NEGATIVE
Specific Gravity, Urine: 1.03 (ref 1.005–1.030)
pH: 6 (ref 5.0–8.0)

## 2012-10-13 LAB — POCT PREGNANCY, URINE: Preg Test, Ur: NEGATIVE

## 2012-10-13 LAB — LIPASE, BLOOD: Lipase: 24 U/L (ref 11–59)

## 2012-10-13 MED ORDER — ONDANSETRON HCL 4 MG/2ML IJ SOLN
4.0000 mg | Freq: Once | INTRAMUSCULAR | Status: AC
  Administered 2012-10-13: 4 mg via INTRAVENOUS
  Filled 2012-10-13: qty 2

## 2012-10-13 MED ORDER — HYDROCODONE-ACETAMINOPHEN 5-325 MG PO TABS: 2.0000 | ORAL_TABLET | Freq: Four times a day (QID) | ORAL | Status: DC | PRN

## 2012-10-13 MED ORDER — MORPHINE SULFATE 4 MG/ML IJ SOLN
4.0000 mg | Freq: Once | INTRAMUSCULAR | Status: AC
  Administered 2012-10-13: 4 mg via INTRAVENOUS
  Filled 2012-10-13: qty 1

## 2012-10-13 MED ORDER — SULFAMETHOXAZOLE-TRIMETHOPRIM 800-160 MG PO TABS: 1.0000 | ORAL_TABLET | Freq: Two times a day (BID) | ORAL | Status: DC

## 2012-10-13 NOTE — ED Provider Notes (Signed)
CSN: [id number]     Arrival date & time 10/13/12  1423 History     First MD Initiated Contact with Patient 10/13/12 1440     Chief Complaint  Patient presents with  . Back Pain   (Consider location/radiation/quality/duration/timing/severity/associated sxs/prior Treatment) HPI Comments: Pt states that it hurts to breathe:pt states that she has back pain radiating to the mid abdomen:some nausea:no vomiting:denies dysuria  Patient is a 22 y.o. female presenting with back pain. The history is provided by the patient. No language interpreter was used.  Back Pain Location:  Thoracic spine Quality:  Shooting Radiates to: abdomen. Pain severity:  Severe Onset quality:  Sudden Timing:  Constant Chronicity:  New Context comment:  Worse with deep breathing Relieved by:  Nothing Exacerbated by: deep breathing:palpation. Ineffective treatments:  None tried   Past Medical History  Diagnosis Date  . Asthma    History reviewed. No pertinent past surgical history. No family history on file. History  Substance Use Topics  . Smoking status: Former Smoker    Types: Cigarettes  . Smokeless tobacco: Never Used  . Alcohol Use: No   OB History   Grav Para Term Preterm Abortions TAB SAB Ect Mult Living                 Review of Systems  Constitutional: Negative.   Respiratory: Negative.   Cardiovascular: Negative.   Musculoskeletal: Positive for back pain.    Allergies  Shellfish allergy  Home Medications   Current Outpatient Rx  Name  Route  Sig  Dispense  Refill  . ibuprofen (ADVIL,MOTRIN) 200 MG tablet   Oral   Take 200 mg by mouth every 6 (six) hours as needed for pain.         Marland Kitchen albuterol (PROVENTIL HFA;VENTOLIN HFA) 108 (90 BASE) MCG/ACT inhaler   Inhalation   Inhale 2 puffs into the lungs every 6 (six) hours as needed for wheezing.          BP 121/70  Pulse 72  Temp(Src) 98.3 F (36.8 C) (Oral)  Resp 20  SpO2 98%  LMP 10/01/2012 Physical Exam  Nursing  note and vitals reviewed. Constitutional: She is oriented to person, place, and time. She appears well-developed and well-nourished.  HENT:  Head: Normocephalic and atraumatic.  Eyes: Conjunctivae and EOM are normal. Pupils are equal, round, and reactive to light.  Neck: Normal range of motion. Neck supple.  Cardiovascular: Normal rate and regular rhythm.   Pulmonary/Chest: Effort normal and breath sounds normal.  Abdominal: Soft. Bowel sounds are normal.  Epigastric tenderness  Musculoskeletal: Normal range of motion.       Thoracic back: She exhibits tenderness. She exhibits normal range of motion.  No swelling or deformity noted to legs or calf  Neurological: She is alert and oriented to person, place, and time.  Skin: Skin is warm and dry.  Psychiatric: Her mood appears anxious.    ED Course   Procedures (including critical care time)  Labs Reviewed  COMPREHENSIVE METABOLIC PANEL - Abnormal; Notable for the following:    Glucose, Bld 103 (*)    Albumin 3.4 (*)    GFR calc non Af Amer 82 (*)    All other components within normal limits  URINALYSIS, ROUTINE W REFLEX MICROSCOPIC - Abnormal; Notable for the following:    APPearance CLOUDY (*)    Hgb urine dipstick TRACE (*)    Leukocytes, UA MODERATE (*)    All other components within normal limits  URINE  MICROSCOPIC-ADD ON - Abnormal; Notable for the following:    Squamous Epithelial / LPF MANY (*)    Bacteria, UA FEW (*)    All other components within normal limits  URINE CULTURE  CBC WITH DIFFERENTIAL  LIPASE, BLOOD  D-DIMER, QUANTITATIVE  POCT PREGNANCY, URINE   Dg Chest 2 View  10/13/2012   *RADIOLOGY REPORT*  Clinical Data: Back pain, shortness of breath.  CHEST - 2 VIEW  Comparison: None.  Findings: Lungs clear.  Heart size and pulmonary vascularity normal.  No effusion.  Visualized bones unremarkable.  IMPRESSION: No acute disease   Original Report Authenticated By: D. Hassell III, MD   1. UTI (lower urinary tract  infection)   2. Gallstone     MDM  Pt is comfortable at this time:double pyelo:pt urine sent for culture:pt is okay to follow up with pcp/surgery as needed  Teressa Lower, NP 10/13/12 1943

## 2012-10-13 NOTE — ED Notes (Signed)
Pt transported to U/S via stretcher with tech

## 2012-10-13 NOTE — ED Notes (Signed)
Patient is resting comfortably. 

## 2012-10-13 NOTE — ED Notes (Signed)
Pt returned from US via stretcher with tech

## 2012-10-13 NOTE — ED Notes (Signed)
Per EMS pt comes in c/o lower, mid line back pain that started today while walking down stairs. Pt states pain wraps around to abd.  Pt has hx asthma and anxiety.

## 2012-10-13 NOTE — ED Notes (Addendum)
Pt states she has had mid back pain radiating in to mid abdomen since this morning.  Pt states pain began when she got up out of bed this morning.  Pt states inspiration increased pain.  Pt denies N/V/D and fever.  Pt also c/o right ankle and calf swelling.  Pt states ankle hurts with ambulation.  Pt states the ankle and calf swelling has been happening for several months.  She has not sought treatment.  Pt's lung sounds are clear.  She denies cough.

## 2012-10-14 LAB — URINE CULTURE

## 2012-10-16 NOTE — ED Provider Notes (Signed)
Medical screening examination/treatment/procedure(s) were performed by non-physician practitioner and as supervising physician I was immediately available for consultation/collaboration.  Derwood Kaplan, MD 10/16/12 1700

## 2012-10-18 ENCOUNTER — Encounter (HOSPITAL_COMMUNITY): Payer: Self-pay | Admitting: Emergency Medicine

## 2012-10-18 ENCOUNTER — Emergency Department (HOSPITAL_COMMUNITY)
Admission: EM | Admit: 2012-10-18 | Discharge: 2012-10-18 | Disposition: A | Discharge status: Home / Self Care | Payer: Self-pay | Attending: Emergency Medicine | Admitting: Emergency Medicine

## 2012-10-18 DIAGNOSIS — Z87891 Personal history of nicotine dependence: Secondary | ICD-10-CM | POA: Insufficient documentation

## 2012-10-18 DIAGNOSIS — Z3202 Encounter for pregnancy test, result negative: Secondary | ICD-10-CM | POA: Insufficient documentation

## 2012-10-18 DIAGNOSIS — R209 Unspecified disturbances of skin sensation: Secondary | ICD-10-CM | POA: Insufficient documentation

## 2012-10-18 DIAGNOSIS — R110 Nausea: Secondary | ICD-10-CM | POA: Insufficient documentation

## 2012-10-18 DIAGNOSIS — K8020 Calculus of gallbladder without cholecystitis without obstruction: Secondary | ICD-10-CM | POA: Insufficient documentation

## 2012-10-18 DIAGNOSIS — J45909 Unspecified asthma, uncomplicated: Secondary | ICD-10-CM | POA: Insufficient documentation

## 2012-10-18 DIAGNOSIS — Z79899 Other long term (current) drug therapy: Secondary | ICD-10-CM | POA: Insufficient documentation

## 2012-10-18 DIAGNOSIS — N12 Tubulo-interstitial nephritis, not specified as acute or chronic: Secondary | ICD-10-CM | POA: Insufficient documentation

## 2012-10-18 DIAGNOSIS — M549 Dorsalgia, unspecified: Secondary | ICD-10-CM

## 2012-10-18 DIAGNOSIS — M546 Pain in thoracic spine: Secondary | ICD-10-CM | POA: Insufficient documentation

## 2012-10-18 LAB — URINALYSIS, ROUTINE W REFLEX MICROSCOPIC
Bilirubin Urine: NEGATIVE
Nitrite: NEGATIVE
Specific Gravity, Urine: 1.031 — ABNORMAL HIGH (ref 1.005–1.030)
Urobilinogen, UA: 0.2 mg/dL (ref 0.0–1.0)
pH: 5 (ref 5.0–8.0)

## 2012-10-18 LAB — URINE MICROSCOPIC-ADD ON

## 2012-10-18 LAB — CBC WITH DIFFERENTIAL/PLATELET
Basophils Absolute: 0 10*3/uL (ref 0.0–0.1)
Eosinophils Relative: 2 % (ref 0–5)
Hemoglobin: 13 g/dL (ref 12.0–15.0)
Lymphocytes Relative: 31 % (ref 12–46)
MCHC: 32.3 g/dL (ref 30.0–36.0)
MCV: 83.8 fL (ref 78.0–100.0)
Monocytes Absolute: 0.7 10*3/uL (ref 0.1–1.0)
RBC: 4.8 MIL/uL (ref 3.87–5.11)
RDW: 13.9 % (ref 11.5–15.5)

## 2012-10-18 LAB — COMPREHENSIVE METABOLIC PANEL
AST: 22 U/L (ref 0–37)
BUN: 9 mg/dL (ref 6–23)
CO2: 24 mEq/L (ref 19–32)
Calcium: 9 mg/dL (ref 8.4–10.5)
Creatinine, Ser: 1.29 mg/dL — ABNORMAL HIGH (ref 0.50–1.10)
GFR calc Af Amer: 68 mL/min — ABNORMAL LOW (ref >90)
GFR calc non Af Amer: 59 mL/min — ABNORMAL LOW (ref >90)

## 2012-10-18 LAB — PREGNANCY, URINE: Preg Test, Ur: NEGATIVE

## 2012-10-18 LAB — LIPASE, BLOOD: Lipase: 21 U/L (ref 11–59)

## 2012-10-18 MED ORDER — IBUPROFEN 600 MG PO TABS: 600.0000 mg | ORAL_TABLET | Freq: Four times a day (QID) | ORAL | Status: DC | PRN

## 2012-10-18 MED ORDER — SODIUM CHLORIDE 0.9 % IV BOLUS (SEPSIS)
1000.0000 mL | Freq: Once | INTRAVENOUS | Status: AC
  Administered 2012-10-18: 1000 mL via INTRAVENOUS

## 2012-10-18 MED ORDER — ONDANSETRON 8 MG PO TBDP
8.0000 mg | ORAL_TABLET | Freq: Once | ORAL | Status: AC
  Administered 2012-10-18: 8 mg via ORAL
  Filled 2012-10-18: qty 1

## 2012-10-18 MED ORDER — FENTANYL CITRATE 0.05 MG/ML IJ SOLN
50.0000 ug | Freq: Once | INTRAMUSCULAR | Status: AC
  Administered 2012-10-18: 50 ug via INTRAMUSCULAR
  Filled 2012-10-18: qty 2

## 2012-10-18 MED ORDER — OXYCODONE-ACETAMINOPHEN 5-325 MG PO TABS
1.0000 | ORAL_TABLET | Freq: Four times a day (QID) | ORAL | Status: DC | PRN
Start: 2012-10-18 — End: 2012-12-20

## 2012-10-18 NOTE — Progress Notes (Signed)
[facility] provided patient with a list of primary care resources in [employer].

## 2012-10-18 NOTE — ED Provider Notes (Signed)
CSN: [id number]     Arrival date & time 10/18/12  1359 History     First MD Initiated Contact with Patient 10/18/12 1403     Chief Complaint  Patient presents with  . Back Pain   (Consider location/radiation/quality/duration/timing/severity/associated sxs/prior Treatment) HPI Comments: Patient is a 22 year old female with a history of asthma who presents for thoracic back pain x 5 days. Patient was seen in the emergency department when symptoms began and diagnosed with bilateral pyelonephritis and gallstones. Patient d/c'd with Bactrim and Vicodin for pain control. Patient states that pain was improving, but worsened again last evening. Patient states pain is in her thoracic back radiating down her back and around to her epigastric abdomen. Pain is aching and sharp intermittently. She admits to associated nausea and denies fever, CP, SOB, vomiting, diarrhea, melena, hematochezia, dysuria, hematuria, vaginal c/o, and numbness/tingling. Patient's last BM was last night which was normal in color and consistency.  Patient is a 22 y.o. female presenting with back pain. The history is provided by the patient. No language interpreter was used.  Back Pain Associated symptoms: no abdominal pain, no chest pain, no dysuria, no fever, no numbness and no weakness     Past Medical History  Diagnosis Date  . Asthma    History reviewed. No pertinent past surgical history. No family history on file. History  Substance Use Topics  . Smoking status: Former Smoker    Types: Cigarettes  . Smokeless tobacco: Never Used  . Alcohol Use: No   OB History   Grav Para Term Preterm Abortions TAB SAB Ect Mult Living                 Review of Systems  Constitutional: Negative for fever.  Respiratory: Negative for shortness of breath.   Cardiovascular: Negative for chest pain.  Gastrointestinal: Positive for nausea. Negative for vomiting, abdominal pain and diarrhea.  Genitourinary: Negative for dysuria and  hematuria.  Musculoskeletal: Positive for back pain.  Neurological: Negative for syncope, weakness and numbness.  All other systems reviewed and are negative.   Allergies  Shellfish allergy  Home Medications   Current Outpatient Rx  Name  Route  Sig  Dispense  Refill  . albuterol (PROVENTIL HFA;VENTOLIN HFA) 108 (90 BASE) MCG/ACT inhaler   Inhalation   Inhale 2 puffs into the lungs every 6 (six) hours as needed for wheezing.         Marland Kitchen ibuprofen (ADVIL,MOTRIN) 200 MG tablet   Oral   Take 200-400 mg by mouth every 6 (six) hours as needed for pain.          Marland Kitchen sulfamethoxazole-trimethoprim (SEPTRA DS) 800-160 MG per tablet   Oral   Take 1 tablet by mouth every 12 (twelve) hours.   10 tablet   0   . ibuprofen (ADVIL,MOTRIN) 600 MG tablet   Oral   Take 1 tablet (600 mg total) by mouth every 6 (six) hours as needed for pain.   30 tablet   0   . oxyCODONE-acetaminophen (PERCOCET/ROXICET) 5-325 MG per tablet   Oral   Take 1-2 tablets by mouth every 6 (six) hours as needed for pain.   15 tablet   0    BP 111/57  Pulse 84  Temp(Src) 97.8 F (36.6 C) (Oral)  Resp 20  SpO2 96%  LMP 10/01/2012  Physical Exam  Nursing note and vitals reviewed. Constitutional: She is oriented to person, place, and time. She appears well-developed and well-nourished. No distress.  Patient  morbidly obese and in no visible or audible discomfort  HENT:  Head: Normocephalic and atraumatic.  Mouth/Throat: Oropharynx is clear and moist. No oropharyngeal exudate.  Eyes: Conjunctivae and EOM are normal. No scleral icterus.  Neck: Normal range of motion.  Cardiovascular: Normal rate, regular rhythm and normal heart sounds.   Pulmonary/Chest: Effort normal and breath sounds normal. No respiratory distress. She has no wheezes. She has no rales.  Abdominal: Soft. She exhibits no mass. There is tenderness. There is no rebound and no guarding.  Tenderness to palpation in the right upper quadrant and  right mid-abdomen. No peritoneal signs or guarding. No palpable pulsatile masses. Negative Murphy sign and no tenderness to palpation at McBurney's point.  Musculoskeletal: Normal range of motion.  Neurological: She is alert and oriented to person, place, and time.  Skin: Skin is warm and dry. No rash noted. She is not diaphoretic. No erythema. No pallor.  Psychiatric: She has a normal mood and affect. Her behavior is normal.   ED Course   Procedures (including critical care time)  Labs Reviewed  URINALYSIS, ROUTINE W REFLEX MICROSCOPIC - Abnormal; Notable for the following:    APPearance CLOUDY (*)    Specific Gravity, Urine 1.031 (*)    Leukocytes, UA LARGE (*)    All other components within normal limits  COMPREHENSIVE METABOLIC PANEL - Abnormal; Notable for the following:    Sodium 134 (*)    Creatinine, Ser 1.29 (*)    GFR calc non Af Amer 59 (*)    GFR calc Af Amer 68 (*)    All other components within normal limits  URINE MICROSCOPIC-ADD ON - Abnormal; Notable for the following:    Squamous Epithelial / LPF FEW (*)    Bacteria, UA FEW (*)    All other components within normal limits  URINE CULTURE  PREGNANCY, URINE  CBC WITH DIFFERENTIAL  LIPASE, BLOOD   No results found.  1. Back pain   2. Gallstones    MDM  22 year old female with a history of asthma who presents for thoracic back pain x 5 days. Patient was seen for same 5 days ago; dx with pyelonephritis and gallstones. Patient well and nontoxic appearing in ED today; afebrile. Physical exam without evidence of acute surgical abdomen or peritoneal signs. Labs repeated and are without leukocytosis, anemia, and electrolyte imbalance. Liver and kidney function preserved. Lipase WNL. UA largely unchanged from prior and patient endorses full compliance with abx provided at last visit. Symptoms managed in ED with IVF, zofran, and fentanyl. Reexamination of the abdomen after these tx elicits improved abdominal TTP. Do not  believe further work up with imaging warranted; imaging from previous visit reviewed. Patient appropriate for d/c with general surgery follow up for further evaluation of symptoms and to discuss surgical options for gallstones. Rx for percocet given for pain control; advil advised in place of percocet for mild-moderate pain. Indications for ED return discussed and patient agreeable to plan with no unaddressed concerns.  Antony Madura, PA-C 10/24/12 1359

## 2012-10-18 NOTE — ED Notes (Signed)
Pt states that she was here recently;y and diagnosed with gallstones. States that today the pain got worse, pain is in middle of back and travel down. Denies n/v

## 2012-10-19 LAB — URINE CULTURE

## 2012-10-26 ENCOUNTER — Ambulatory Visit: Payer: Medicaid Other

## 2012-10-28 NOTE — ED Provider Notes (Signed)
Medical screening examination/treatment/procedure(s) were performed by non-physician practitioner and as supervising physician I was immediately available for consultation/collaboration.  Lailani Tool, MD 10/28/12 2058 

## 2012-10-30 ENCOUNTER — Ambulatory Visit: Payer: Medicaid Other

## 2012-11-22 ENCOUNTER — Ambulatory Visit (INDEPENDENT_AMBULATORY_CARE_PROVIDER_SITE_OTHER): Payer: Medicaid Other | Admitting: Internal Medicine

## 2012-11-22 ENCOUNTER — Encounter: Payer: Self-pay | Admitting: Internal Medicine

## 2012-11-22 VITALS — BP 118/75 | HR 71 | Temp 98.6°F | Ht 61.5 in | Wt 281.8 lb

## 2012-11-22 DIAGNOSIS — K8020 Calculus of gallbladder without cholecystitis without obstruction: Secondary | ICD-10-CM

## 2012-11-22 DIAGNOSIS — Z23 Encounter for immunization: Secondary | ICD-10-CM

## 2012-11-22 DIAGNOSIS — Z299 Encounter for prophylactic measures, unspecified: Secondary | ICD-10-CM

## 2012-11-22 DIAGNOSIS — Z0000 Encounter for general adult medical examination without abnormal findings: Secondary | ICD-10-CM

## 2012-11-22 NOTE — Progress Notes (Signed)
Patient ID: Alyssa Goodman, female   DOB: 1990/12/14, 22 y.o.   MRN: [id number]  Subjective:   Patient ID: Alyssa Goodman female   DOB: 11-29-1990 22 y.o.   MRN: [id number]  HPI: Ms.Alyssa Goodman is a 22 y.o. F with PMH asthma and recently treated for a UTI presents to teh clinic for an ED f/u.  She was seen in the ED on 8/9 c/o back pain. At that time her UA was positive and she was treated with Bactrim for a UTI. However, the pain persisted and she was experiencing nausea, so she returned to the ED on 8/14. She did have an abdominal u/s done in the ED on her 1st visit on 8/9 which showed multiple gallstones w/o gallbladder thickening, and at her 2nd ED visit, because she was still c/o pain and was TTP in her RUQ, she was referred to General Surgery for further evaluation as an outpatient. GenSurg could not see her and told her to have her PCP refer her to another surgeon.   She states that the pain is intermittent and can be sharp. She states that the pain feels like it is coming from her mid back primarily, but does have RUQ pain as well. She does have pain with some meals but not always. Of note, her back pain is generally present when she 1st wakes up in the morning.   Past Medical History  Diagnosis Date  . Asthma    Current Outpatient Prescriptions  Medication Sig Dispense Refill  . albuterol (PROVENTIL HFA;VENTOLIN HFA) 108 (90 BASE) MCG/ACT inhaler Inhale 2 puffs into the lungs every 6 (six) hours as needed for wheezing.      Marland Kitchen ibuprofen (ADVIL,MOTRIN) 200 MG tablet Take 200-400 mg by mouth every 6 (six) hours as needed for pain.       Marland Kitchen ibuprofen (ADVIL,MOTRIN) 600 MG tablet Take 1 tablet (600 mg total) by mouth every 6 (six) hours as needed for pain.  30 tablet  0  . oxyCODONE-acetaminophen (PERCOCET/ROXICET) 5-325 MG per tablet Take 1-2 tablets by mouth every 6 (six) hours as needed for pain.  15 tablet  0   No current facility-administered medications for this visit.   No family  history on file. History   Social History  . Marital Status: Single    Spouse Name: N/A    Number of Children: N/A  . Years of Education: N/A   Social History Main Topics  . Smoking status: Former Smoker    Types: Cigarettes  . Smokeless tobacco: Never Used  . Alcohol Use: No  . Drug Use: No  . Sexual Activity: None   Other Topics Concern  . None   Social History Narrative  . None   Review of Systems: Constitutional: Denies fever, chills, diaphoresis, appetite change and fatigue.  HEENT: Denies photophobia, eye pain, redness, hearing loss, ear pain, congestion, sore throat, rhinorrhea, sneezing, mouth sores, trouble swallowing, neck pain, neck stiffness and tinnitus.   Respiratory: Denies SOB, DOE, cough, chest tightness,  and wheezing.   Cardiovascular: Denies chest pain, palpitations and leg swelling.  Gastrointestinal: +nausea and abdominal pain. Denies vomiting, abdominal pain, diarrhea, constipation, blood in stool and abdominal distention.  Genitourinary: Denies dysuria, urgency, frequency, hematuria, flank pain and difficulty urinating.  Endocrine: Denies: hot or cold intolerance, sweats, changes in hair or nails, polyuria, polydipsia. Musculoskeletal: +mid back pain. Denies myalgias, joint swelling, arthralgias and gait problem.  Skin: Denies pallor, rash and wound.  Neurological: Denies dizziness, seizures,  syncope, weakness, light-headedness, numbness and headaches.  Psychiatric/Behavioral: Denies suicidal ideation, mood changes, confusion, nervousness, sleep disturbance and agitation  Objective:  Physical Exam: Filed Vitals:   11/22/12 1609  BP: 118/75  Pulse: 71  Temp: 98.6 F (37 C)  TempSrc: Oral  Height: 5' 1.5" (1.562 m)  Weight: 281 lb 12.8 oz (127.824 kg)  SpO2: 98%   Constitutional: Vital signs reviewed.  Patient is a well-developed and well-nourished female in no acute distress and cooperative with exam. Alert and oriented x3.  Head: Normocephalic  and atraumatic Mouth: no erythema or exudates, MMM, piercing in right bottom lip Eyes: PERRL, EOMI, conjunctivae normal, No scleral icterus.  Neck: Supple, Trachea midline normal ROM, No JVD, mass Cardiovascular: RRR, S1 normal, S2 normal, no MRG, pulses symmetric and intact bilaterally Pulmonary/Chest: normal respiratory effort, CTAB, no wheezes, rales, or rhonchi Abdominal: Soft, non-distended. Mild RUQ TTP. GU: No CVA tenderness Musculoskeletal: No joint deformities, erythema, or stiffness, ROM full and no nontender. Mild TTP in mid thoracic spine Neurological: A&O x3, Strength is normal and symmetric bilaterally, cranial nerve II-XII are grossly intact, no focal motor deficit, sensory intact to light touch bilaterally.  Skin: Warm, dry and intact. No rash, cyanosis, or clubbing.  Psychiatric: Normal mood and affect. speech and behavior is normal. Judgment and thought content normal. Cognition and memory are normal.   Assessment & Plan:   Please refer to Problem List based Assessment and Plan

## 2012-11-22 NOTE — Patient Instructions (Addendum)
**  If your pain worsens please call the clinic or return to the Emergency Room for further evaluation. We will contact [facility] and see if their surgery clinic is accepting new patients.   **I'd like to see you back in clinic in 2 weeks for your tetanus shot. Hopefully we will have answers for you regarding a Surgery appointment.     Cholelithiasis Cholelithiasis (also called gallstones) is a form of gallbladder disease where gallstones form in your gallbladder. The gallbladder is a non-essential organ that stores bile made in the liver, which helps digest fats. Gallstones begin as small crystals and slowly grow into stones. Gallstone pain occurs when the gallbladder spasms, and a gallstone is blocking the duct. Pain can also occur when a stone passes out of the duct.  Women are more likely to develop gallstones than men. Other factors that increase the risk of gallbladder disease are:  Having multiple pregnancies. Physicians sometimes advise removing diseased gallbladders before future pregnancies.  Obesity.  Diets heavy in fried foods and fat.  Increasing age (older than 25).  Prolonged use of medications containing female hormones.  Diabetes mellitus.  Rapid weight loss.  Family history of gallstones (heredity). SYMPTOMS  Feeling sick to your stomach (nauseous).  Abdominal pain.  Yellowing of the skin (jaundice).  Sudden pain. It may persist from several minutes to several hours.  Worsening pain with deep breathing or when jarred.  Fever.  Tenderness to the touch. In some cases, when gallstones do not move into the bile duct, people have no pain or symptoms. These are called "silent" gallstones. TREATMENT In severe cases, emergency surgery may be required. HOME CARE INSTRUCTIONS   Only take over-the-counter or prescription medicines for pain, discomfort, or fever as directed by your caregiver.  Follow a low-fat diet until seen again. Fat causes the gallbladder to  contract, which can result in pain.  Follow up as instructed. Attacks are almost always recurrent and surgery is usually required for permanent treatment. SEEK IMMEDIATE MEDICAL CARE IF:   Your pain increases and is not controlled by medications.  You have an oral temperature above 102 F (38.9 C), not controlled by medication.  You develop nausea and vomiting. MAKE SURE YOU:   Understand these instructions.  Will watch your condition.  Will get help right away if you are not doing well or get worse. Document Released: 02/17/2005 Document Revised: 05/16/2011 Document Reviewed: 04/22/2010 [facility] Patient Information 2014 [place], [place].

## 2012-11-22 NOTE — Assessment & Plan Note (Signed)
Seen on u/s on 10/13/12. Pt with intermittent pain, which seems to be consistent with cholelithiasis. She does c/o back pain and has mild TTP in mid thoracic spine. Will have her f/u in 2 weeks to reevaluate the pain. Will contact the Surgical clinics at [facility] to see about their payment options, as she only has the "family planning" Medicaid and cannot afford to pay out of pocket. If we cannot get her into these places, will refer to GenSurg affiliated with [facility], but their wait-list is through the end of this year.

## 2012-11-22 NOTE — Assessment & Plan Note (Signed)
Pt to return in 2 weeks and will give tetanus then.

## 2012-11-23 ENCOUNTER — Ambulatory Visit: Payer: No Typology Code available for payment source

## 2012-11-23 NOTE — Progress Notes (Signed)
Case discussed with Dr. Glenn at the time of the visit.  We reviewed the resident's history and exam and pertinent patient test results.  I agree with the assessment, diagnosis, and plan of care documented in the resident's note.   

## 2012-12-20 ENCOUNTER — Ambulatory Visit (INDEPENDENT_AMBULATORY_CARE_PROVIDER_SITE_OTHER): Payer: No Typology Code available for payment source | Admitting: Dietician

## 2012-12-20 ENCOUNTER — Encounter: Payer: Self-pay | Admitting: Dietician

## 2012-12-20 ENCOUNTER — Ambulatory Visit (INDEPENDENT_AMBULATORY_CARE_PROVIDER_SITE_OTHER): Payer: No Typology Code available for payment source | Admitting: Internal Medicine

## 2012-12-20 ENCOUNTER — Encounter: Payer: Self-pay | Admitting: Internal Medicine

## 2012-12-20 VITALS — BP 120/88 | HR 67 | Temp 97.6°F | Ht 61.5 in | Wt 282.2 lb

## 2012-12-20 DIAGNOSIS — K8020 Calculus of gallbladder without cholecystitis without obstruction: Secondary | ICD-10-CM

## 2012-12-20 DIAGNOSIS — Z23 Encounter for immunization: Secondary | ICD-10-CM

## 2012-12-20 DIAGNOSIS — M545 Low back pain: Secondary | ICD-10-CM | POA: Insufficient documentation

## 2012-12-20 DIAGNOSIS — J45909 Unspecified asthma, uncomplicated: Secondary | ICD-10-CM

## 2012-12-20 DIAGNOSIS — Z0000 Encounter for general adult medical examination without abnormal findings: Secondary | ICD-10-CM

## 2012-12-20 NOTE — Progress Notes (Signed)
Case discussed with Dr. Glenn soon after the resident saw the patient.  We reviewed the resident's history and exam and pertinent patient test results.  I agree with the assessment, diagnosis, and plan of care documented in the resident's note. 

## 2012-12-20 NOTE — Patient Instructions (Addendum)
Continue to work on your excerise and healthy eating. You can try ibuprofen 200mg  every 4-6 hours for your back pain. I think that as you become stronger and are able to lose some weight, your back pain will improve.   If you begin to have pain in your right abdomen, please call the clinic, as your gallstones are likely acting up resulting in the pain. If you change your mind about seeing the Surgeons at [facility]), you can call the clinic at [phone]. They can give you information about payment/fees. If you do decided to visit them, please call us so we can send your records to them.   Back Exercises Back exercises help treat and prevent back injuries. The goal of back exercises is to increase the strength of your abdominal and back muscles and the flexibility of your back. These exercises should be started when you no longer have back pain. Back exercises include:  Pelvic Tilt. Lie on your back with your knees bent. Tilt your pelvis until the lower part of your back is against the floor. Hold this position 5 to 10 sec and repeat 5 to 10 times.  Knee to Chest. Pull first 1 knee up against your chest and hold for 20 to 30 seconds, repeat this with the other knee, and then both knees. This may be done with the other leg straight or bent, whichever feels better.  Sit-Ups or Curl-Ups. Bend your knees 90 degrees. Start with tilting your pelvis, and do a partial, slow sit-up, lifting your trunk only 30 to 45 degrees off the floor. Take at least 2 to 3 seconds for each sit-up. Do not do sit-ups with your knees out straight. If partial sit-ups are difficult, simply do the above but with only tightening your abdominal muscles and holding it as directed.  Hip-Lift. Lie on your back with your knees flexed 90 degrees. Push down with your feet and shoulders as you raise your hips a couple inches off the floor; hold for 10 seconds, repeat 5 to 10 times.  Back arches. Lie on your stomach, propping  yourself up on bent elbows. Slowly press on your hands, causing an arch in your low back. Repeat 3 to 5 times. Any initial stiffness and discomfort should lessen with repetition over time.  Shoulder-Lifts. Lie face down with arms beside your body. Keep hips and torso pressed to floor as you slowly lift your head and shoulders off the floor. Do not overdo your exercises, especially in the beginning. Exercises may cause you some mild back discomfort which lasts for a few minutes; however, if the pain is more severe, or lasts for more than 15 minutes, do not continue exercises until you see your caregiver. Improvement with exercise therapy for back problems is slow.  See your caregivers for assistance with developing a proper back exercise program. Document Released: 03/31/2004 Document Revised: 05/16/2011 Document Reviewed: 12/23/2010 [facility] Patient Information 2014 [place], [place].

## 2012-12-20 NOTE — Assessment & Plan Note (Addendum)
Per patient, she denies any rectal quadrant pain, or abdominal pain after meals, esp fatty greasy meals. Initially a fall that her back pain was related to gallstones as she endorsed back pain that radiated toward her abdomen in the right upper quadrant. But now I don't think this is the case. Currently her gallstones do not seem to be causing her any discomfort. She does not have insurance, so I have had a hard time getting her seen here in [place]. There is a surgical clinic at [facility] for uninsured patients. She states that she would like to hold off on visiting them at this time, as she is trying to save money. She states she will call if her symptoms restart/worsen. Discussed with her that if her symptoms do begin again, that she will possibly become very sick and will require surgery at that time. She endorses understanding.

## 2012-12-20 NOTE — Assessment & Plan Note (Addendum)
Patient complains of pain in her upper lumbar spine that is present in the mornings when she awakens, but improved/resolved with mobility. Originally was thought that this could be from her gallstones; but currently she is not having any associated symptoms with her gallstones, yet she continues to have this back pain in the mornings. I suspect this pain is secondary to her body habitus and lack of exercise. She states that since starting classes back a few weeks ago she's not had time to exercise. I have referred her to [facility] for nutrition counseling, and provided her with some back exercises, and given her a form for a scholarship at the [facility] so she can resume exercise. We also discussed the need  to set aside time during the day for exercise, similar as to what she does for her schoolwork. For now she's told for her pain that she can take ibuprofen 200 mg every 4-6 hours when necessary pain.

## 2012-12-20 NOTE — Assessment & Plan Note (Addendum)
Her weight remains stable, but with her continued complaints of lumbar spinal pain is worse in the mornings, I am concerned that this is more likely related to her obesity and lack of physical activity than to her gallstones. I referred her to [facility] our nutritional educator, and she is meeting with her after our clinic visit today. I encouraged patient to have frequent visits with Lupita Leash; stating that she shouldn't occur at least 3 or 4 times a day next few months if not more. Patient knowledge understanding and is willing to meet with Lupita Leash.   Also encouraged exercise. Patient states she's interested in trying the [facility] We will see if we can get her information regarding a scholarship to the [facility]

## 2012-12-20 NOTE — Assessment & Plan Note (Signed)
Her asthma is well controlled at this time. She's not having to use her rescue inhaler at all at this time.

## 2012-12-20 NOTE — Progress Notes (Signed)
Medical Nutrition Therapy:  Appt start time: 1130 end time:  1230.  Assessment:  Primary concerns today: Weight management. Wants to decrease her weight Patient lives with parents while attending school. To graduate next June and intends to continue her education.   Sleeps 5-8  Hours a night. Usual physical activity includes sedentary activities- school, computer. Reports weight ~ 150# in middle school when she began eating as a result of her depression and stress. Rates depression as a 2-3 /10 today and tearful during visit. She denies purging, past traumatic events, desire for anti-depression medicine, but has considered counseling in the past. This was discussed with her physician. Current weight 282# with BMI of 52.  Rates readiness to change a 9-10/10 and self confidence that she can change a 5/10.   Progress Towards Goal(s):  In progress.   Nutritional Diagnosis:  [place]-3.3 Overweight/obesity As related to emotional eating.  As evidenced by her report of same. .    Intervention:  Nutrition education regarding elements needed for successful weight loss. Nutrition counseling about cycle of emotional eating and importance of addressing (being aware and accepting of)  underlying feelings.  Coordintion of Care- discussed care with her primary care physician  Monitoring/Evaluation:  Dietary intake, exercise, and body weight in 1 week(s).

## 2012-12-20 NOTE — Progress Notes (Signed)
Patient ID: Alyssa Goodman, female   DOB: 07/04/1990, 22 y.o.   MRN: [id number]  Subjective:   Patient ID: Alyssa Goodman female   DOB: October 03, 1990 22 y.o.   MRN: [id number]  HPI: [name] is a 22 y.o. F with PMH PMH asthma and gallstones presents for a routine f/u.  She still endorses pain in her back in the mornings that improves once she gets up and becomes mobile. She denies any RUQ pain her pain after meals especially fatty meals. She states that she does not feel her gallstones are causing any problems at this time, and she's feeling much better than she did previously. She states at this time she does not want to pursue seeing a surgeon, as she is trying to save money and is asymptomatic.   Past Medical History  Diagnosis Date  . Asthma    Current Outpatient Prescriptions  Medication Sig Dispense Refill  . albuterol (PROVENTIL HFA;VENTOLIN HFA) 108 (90 BASE) MCG/ACT inhaler Inhale 2 puffs into the lungs every 6 (six) hours as needed for wheezing.      Marland Kitchen ibuprofen (ADVIL,MOTRIN) 200 MG tablet Take 200-400 mg by mouth every 6 (six) hours as needed for pain.       Marland Kitchen ibuprofen (ADVIL,MOTRIN) 600 MG tablet Take 1 tablet (600 mg total) by mouth every 6 (six) hours as needed for pain.  30 tablet  0  . oxyCODONE-acetaminophen (PERCOCET/ROXICET) 5-325 MG per tablet Take 1-2 tablets by mouth every 6 (six) hours as needed for pain.  15 tablet  0   No current facility-administered medications for this visit.   No family history on file. History   Social History  . Marital Status: Single    Spouse Name: N/A    Number of Children: N/A  . Years of Education: N/A   Social History Main Topics  . Smoking status: Former Smoker    Types: Cigarettes  . Smokeless tobacco: Never Used  . Alcohol Use: No  . Drug Use: No  . Sexual Activity: None   Other Topics Concern  . None   Social History Narrative  . None   Review of Systems: Constitutional: Denies fever, chills, diaphoresis,  appetite change and fatigue.  HEENT: Denies photophobia, eye pain, redness, hearing loss, ear pain, congestion, sore throat, rhinorrhea, sneezing, mouth sores, trouble swallowing, neck pain, neck stiffness and tinnitus.   Respiratory: Denies SOB, DOE, cough, chest tightness,  and wheezing.   Cardiovascular: Denies chest pain, palpitations and leg swelling.  Gastrointestinal: Denies nausea, vomiting, abdominal pain, diarrhea, constipation, blood in stool and abdominal distention.  Genitourinary: Denies dysuria, urgency, frequency, hematuria, flank pain and difficulty urinating.  Endocrine: Denies: hot or cold intolerance, sweats, changes in hair or nails, polyuria, polydipsia. Musculoskeletal: +back pain in the mornings around her upper lumbar spine, improved with movement. Denies joint swelling, arthralgias and gait problem.  Skin: Denies pallor, rash and wound.  Neurological: Denies dizziness, seizures, syncope, weakness, light-headedness, numbness and headaches.  Psychiatric/Behavioral: Denies suicidal ideation, mood changes, confusion, nervousness, sleep disturbance and agitation  Objective:  Physical Exam: Filed Vitals:   12/20/12 1052  BP: 120/88  Pulse: 67  Temp: 97.6 F (36.4 C)  TempSrc: Oral  Height: 5' 1.5" (1.562 m)  Weight: 282 lb 3.2 oz (128.005 kg)  SpO2: 98%   Constitutional: Vital signs reviewed.  Patient is a morbidly obese female in no acute distress and cooperative with exam.   Head: Normocephalic and atraumatic Eyes: PERRL, EOMI, conjunctivae normal, No  scleral icterus.  Neck: Supple, Trachea midline normal ROM, No JVD, mass, thyromegaly, or carotid bruit present.  Cardiovascular: RRR, S1 normal, S2 normal, no MRG, pulses symmetric and intact bilaterally Pulmonary/Chest: normal respiratory effort, CTAB, no wheezes, rales, or rhonchi Abdominal: Soft. Non-tender, non-distended, bowel sounds are normal, no masses, organomegaly, or guarding present.  GU: no CVA  tenderness Musculoskeletal: No joint deformities, erythema, or stiffness, ROM full and no nontender. No pain with palpation of spine or soft tissues of the back. Neurological: A&O x3, Strength is normal and symmetric bilaterally, cranial nerve II-XII are grossly intact, no focal motor deficit, sensory intact to light touch bilaterally.  Skin: Warm, dry and intact. No rash, cyanosis, or clubbing.  Psychiatric: Normal mood and affect. speech and behavior is normal. Judgment and thought content normal. Cognition and memory are normal.   Assessment & Plan:   Please refer to Problem List based Assessment and Plan

## 2012-12-24 ENCOUNTER — Ambulatory Visit: Payer: No Typology Code available for payment source | Admitting: Dietician

## 2012-12-24 ENCOUNTER — Encounter: Payer: Self-pay | Admitting: Internal Medicine

## 2013-01-02 ENCOUNTER — Telehealth: Payer: Self-pay | Admitting: Dietician

## 2013-01-03 NOTE — Telephone Encounter (Signed)
Followed up on missed appointment. Offered to refer her to different dietitan or to reschedule her appointment. Patient says she wants to concentrate on school right now and cannot afford time to come to appointments. Offered to be a resource for her whenever she is ready to discuss weight loss in the future.

## 2013-03-27 ENCOUNTER — Ambulatory Visit: Payer: Medicaid Other

## 2013-04-01 ENCOUNTER — Ambulatory Visit: Payer: Medicaid Other

## 2013-04-12 ENCOUNTER — Ambulatory Visit: Payer: Medicaid Other

## 2013-04-15 ENCOUNTER — Ambulatory Visit: Payer: Medicaid Other | Admitting: Internal Medicine

## 2013-04-16 ENCOUNTER — Ambulatory Visit (INDEPENDENT_AMBULATORY_CARE_PROVIDER_SITE_OTHER): Payer: No Typology Code available for payment source | Admitting: Internal Medicine

## 2013-04-16 ENCOUNTER — Encounter: Payer: Self-pay | Admitting: Internal Medicine

## 2013-04-16 ENCOUNTER — Other Ambulatory Visit (HOSPITAL_COMMUNITY)
Admission: RE | Admit: 2013-04-16 | Discharge: 2013-04-16 | Disposition: A | Discharge status: Home / Self Care | Payer: No Typology Code available for payment source | Source: Ambulatory Visit | Attending: Internal Medicine | Admitting: Internal Medicine

## 2013-04-16 VITALS — BP 124/79 | HR 79 | Temp 97.3°F | Ht 61.0 in | Wt 290.9 lb

## 2013-04-16 DIAGNOSIS — Z113 Encounter for screening for infections with a predominantly sexual mode of transmission: Secondary | ICD-10-CM | POA: Insufficient documentation

## 2013-04-16 DIAGNOSIS — N760 Acute vaginitis: Secondary | ICD-10-CM | POA: Insufficient documentation

## 2013-04-16 DIAGNOSIS — N898 Other specified noninflammatory disorders of vagina: Secondary | ICD-10-CM

## 2013-04-16 NOTE — Patient Instructions (Addendum)
Please follow-up with Dr. Sherrine MaplesGlenn in 3 months.  You will be due for a repeat Pap smear at that time.   We sent several tests today to see what the cause of your vaginal discharge will be (BV, yeast, and 3 STDs).  If any of these are positive (abnormal), I will call you then send in the appropriate medications.  There is some information on bacterial vaginosis below since you were asking about it.  We do not know yet that this is the cause of your discharge, but I wanted you to have the information if you wish to read about it.   Remember to always use a barrier contraceptive like condoms to help prevents STDs, which it sounds like you are already doing!   Bacterial Vaginosis Bacterial vaginosis is a vaginal infection that occurs when the normal balance of bacteria in the vagina is disrupted. It results from an overgrowth of certain bacteria. This is the most common vaginal infection in women of childbearing age. Treatment is important to prevent complications, especially in pregnant women, as it can cause a premature delivery. CAUSES  Bacterial vaginosis is caused by an increase in harmful bacteria that are normally present in smaller amounts in the vagina. Several different kinds of bacteria can cause bacterial vaginosis. However, the reason that the condition develops is not fully understood. RISK FACTORS Certain activities or behaviors can put you at an increased risk of developing bacterial vaginosis, including:  Having a new sex partner or multiple sex partners.  Douching.  Using an intrauterine device (IUD) for contraception. Women do not get bacterial vaginosis from toilet seats, bedding, swimming pools, or contact with objects around them. SIGNS AND SYMPTOMS  Some women with bacterial vaginosis have no signs or symptoms. Common symptoms include:  Grey vaginal discharge.  A fishlike odor with discharge, especially after sexual intercourse.  Itching or burning of the vagina and  vulva.  Burning or pain with urination. DIAGNOSIS  Your health care provider will take a medical history and examine the vagina for signs of bacterial vaginosis. A sample of vaginal fluid may be taken. Your health care provider will look at this sample under a microscope to check for bacteria and abnormal cells. A vaginal pH test may also be done.  TREATMENT  Bacterial vaginosis may be treated with antibiotic medicines. These may be given in the form of a pill or a vaginal cream. A second round of antibiotics may be prescribed if the condition comes back after treatment.  HOME CARE INSTRUCTIONS   Only take over-the-counter or prescription medicines as directed by your health care provider.  If antibiotic medicine was prescribed, take it as directed. Make sure you finish it even if you start to feel better.  Do not have sex until treatment is completed.  Tell all sexual partners that you have a vaginal infection. They should see their health care provider and be treated if they have problems, such as a mild rash or itching.  Practice safe sex by using condoms and only having one sex partner. SEEK MEDICAL CARE IF:   Your symptoms are not improving after 3 days of treatment.  You have increased discharge or pain.  You have a fever. MAKE SURE YOU:   Understand these instructions.  Will watch your condition.  Will get help right away if you are not doing well or get worse. FOR MORE INFORMATION  Centers for Disease Control and Prevention, Division of STD Prevention: SolutionApps.co.zawww.cdc.gov/std American Sexual Health Association (ASHA):  www.ashastd.org  Document Released: 02/21/2005 Document Revised: 12/12/2012 Document Reviewed: 10/03/2012 [facility] Patient Information 2014 [place], [place].   Calorie Counting Diet A calorie counting diet requires you to eat the number of calories that are right for you in a day. Calories are the measurement of how much energy you get from the food you eat.  Eating the right amount of calories is important for staying at a healthy weight. If you eat too many calories, your body will store them as fat and you may gain weight. If you eat too few calories, you may lose weight. Counting the number of calories you eat during a day will help you know if you are eating the right amount. A Registered Dietitian can determine how many calories you need in a day. The amount of calories needed varies from person to person. If your goal is to lose weight, you will need to eat fewer calories. Losing weight can benefit you if you are overweight or have health problems such as heart disease, high blood pressure, or diabetes. If your goal is to gain weight, you will need to eat more calories. Gaining weight may be necessary if you have a certain health problem that causes your body to need more energy. TIPS Whether you are increasing or decreasing the number of calories you eat during a day, it may be hard to get used to changes in what you eat and drink. The following are tips to help you keep track of the number of calories you eat.  Measure foods at home with measuring cups. This helps you know the amount of food and number of calories you are eating.  Restaurants often serve food in amounts that are larger than 1 serving. While eating out, estimate how many servings of a food you are given. For example, a serving of cooked rice is  cup or about the size of half of a fist. Knowing serving sizes will help you be aware of how much food you are eating at restaurants.  Ask for smaller portion sizes or child-size portions at restaurants.  Plan to eat half of a meal at a restaurant. Take the rest home or share the other half with a friend.  Read the Nutrition Facts panel on food labels for calorie content and serving size. You can find out how many servings are in a package, the size of a serving, and the number of calories each serving has.  For example, a package might  contain 3 cookies. The Nutrition Facts panel on that package says that 1 serving is 1 cookie. Below that, it will say there are 3 servings in the container. The calories section of the Nutrition Facts label says there are 90 calories. This means there are 90 calories in 1 cookie (1 serving). If you eat 1 cookie you have eaten 90 calories. If you eat all 3 cookies, you have eaten 270 calories (3 servings x 90 calories = 270 calories). The list below tells you how big or small some common portion sizes are.  1 oz.........4 stacked dice.  3 oz........Marland KitchenDeck of cards.  1 tsp.......Marland KitchenTip of little finger.  1 tbs......Marland KitchenMarland KitchenThumb.  2 tbs.......Marland KitchenGolf ball.   cup......Marland KitchenHalf of a fist.  1 cup.......Marland KitchenA fist. KEEP A FOOD LOG Write down every food item you eat, the amount you eat, and the number of calories in each food you eat during the day. At the end of the day, you can add up the total number of calories you have  eaten. It may help to keep a list like the one below. Find out the calorie information by reading the Nutrition Facts panel on food labels. Breakfast  Bran cereal (1 cup, 110 calories).  Fat-free milk ( cup, 45 calories). Snack  Apple (1 medium, 80 calories). Lunch  Spinach (1 cup, 20 calories).  Tomato ( medium, 20 calories).  Chicken breast strips (3 oz, 165 calories).  Shredded cheddar cheese ( cup, 110 calories).  Light [place] dressing (2 tbs, 60 calories).  Whole-wheat bread (1 slice, 80 calories).  Tub margarine (1 tsp, 35 calories).  Vegetable soup (1 cup, 160 calories). Dinner  Pork chop (3 oz, 190 calories).  Brown rice (1 cup, 215 calories).  Steamed broccoli ( cup, 20 calories).  Strawberries (1  cup, 65 calories).  Whipped cream (1 tbs, 50 calories). Daily Calorie Total: 1425 Document Released: 02/21/2005 Document Revised: 05/16/2011 Document Reviewed: 08/18/2006 [facility] Patient Information 2014 [place], [place].

## 2013-04-16 NOTE — Assessment & Plan Note (Addendum)
Patient presents with white vaginal discharge x 2 weeks with fishy odor; no itching, burning, bleeding, dyspareunia.  LMP 03/28/13.  No new sexual partners, and she reports using condoms 100% of the time.  She is frustrated that this is the third time she has had issues with vaginal discharge.  On exam, relatively copious amount of thin white discharge throughout vaginal vault.      Sent wet prep for BV, candida, trichomonas as well vaginal probes for GC/chlamydia.  Patient would like us to call [phone] with results.   ADDENDUM: Trichomonas positive, called in Flagyl 2g once.  (Candida -, gardenerella -) GC/Chalmydia pending Called patient to inform her of these results, she will pick up medication today.  She will also inform her sexual partner(s) so they can get treatment as well.

## 2013-04-16 NOTE — Progress Notes (Signed)
Patient ID: Alyssa Goodman, female   DOB: 09/21/1990, 23 y.o.   MRN: [id number]   Subjective:   Patient ID: Alyssa Goodman female   DOB: 12/20/1990 22 y.o.   MRN: [id number]  HPI: Ms.Alyssa Goodman is a 23 y.o. woman with history of asthma, gallstones, and trichomonas/BV x 2 episodes who presents for acute visit for vaginal discharge.   Patient states she has been having vaginal discharge x 2 weeks.  Discharge is white to grey-brown in color and has fishy odor.  Patient thinks it is similar to her previous episode of bacterial vaginosis.  No new sexual partners.  She reliably uses condoms, stating "no glove no love."  No burning, itching, or bleeding; no dyspareunia or fever.   Patient states she is doing pretty well in terms of her other medical problems.  Only rarely having to use Albuterol inhaler (her asthma seems to be better in winter months) and having only occasional transient pain in her back that she attributes to either gallstones or heavy lifting at work.  She is no longer even taking Advil for pain.   Past Medical History  Diagnosis Date  . Asthma    Current Outpatient Prescriptions  Medication Sig Dispense Refill  . albuterol (PROVENTIL HFA;VENTOLIN HFA) 108 (90 BASE) MCG/ACT inhaler Inhale 2 puffs into the lungs every 6 (six) hours as needed for wheezing.      Marland Kitchen. ibuprofen (ADVIL,MOTRIN) 600 MG tablet Take 1 tablet (600 mg total) by mouth every 6 (six) hours as needed for pain.  30 tablet  0   No current facility-administered medications for this visit.   No family history on file. History   Social History  . Marital Status: Single    Spouse Name: N/A    Number of Children: N/A  . Years of Education: 14   Occupational History  . UNEMPLOYED    Social History Main Topics  . Smoking status: Former Smoker    Types: Cigarettes  . Smokeless tobacco: Never Used  . Alcohol Use: No  . Drug Use: No  . Sexual Activity: None   Other Topics Concern  . None   Social History  Narrative  . None   Review of Systems: Review of Systems  Constitutional: Negative for fever, chills and weight loss.  Eyes: Negative for blurred vision.  Respiratory: Negative for cough, shortness of breath and wheezing.   Cardiovascular: Negative for chest pain.  Gastrointestinal: Negative for nausea, vomiting, abdominal pain, diarrhea and constipation.  Genitourinary: Negative for dysuria.  Musculoskeletal: Positive for back pain. Negative for falls.  Neurological: Negative for dizziness, loss of consciousness, weakness and headaches.    Objective:  Physical Exam: Filed Vitals:   04/16/13 1428  BP: 124/79  Pulse: 79  Temp: 97.3 F (36.3 C)  TempSrc: Oral  Height: 5\' 1"  (1.549 m)  Weight: 290 lb 14.4 oz (131.951 kg)  SpO2: 98%   General: alert, cooperative, and in no apparent distress HEENT: NCAT, vision grossly intact, oropharynx clear and non-erythematous  Neck: supple, no lymphadenopathy Lungs: clear to ascultation bilaterally, normal work of respiration, no wheezes, rales, ronchi Heart: regular rate and rhythm, no murmurs, gallops, or rubs Abdomen: soft, non-tender, non-distended, normal bowel sounds Extremities: 2+ DP/PT pulses bilaterally, no cyanosis, clubbing, or edema Neurologic: alert & oriented X3, cranial nerves II-XII intact, strength grossly intact, sensation intact to light touch  Assessment & Plan:  Patient discussed with Dr. Dalphine HandingBhardwaj. Please see problem-based assessment and plan.

## 2013-04-17 MED ORDER — METRONIDAZOLE 500 MG PO TABS: 500.0000 mg | ORAL_TABLET | Freq: Once | ORAL | Status: AC

## 2013-04-17 NOTE — Progress Notes (Signed)
Case discussed with Dr. Rogers at the time of the visit.  We reviewed the resident's history and exam and pertinent patient test results.  I agree with the assessment, diagnosis, and plan of care documented in the resident's note. 

## 2013-04-17 NOTE — Addendum Note (Signed)
Addended by: Leanora CoverOGERS, Whalen Trompeter E on: 04/17/2013 03:24 PM   Modules accepted: Orders

## 2013-05-07 ENCOUNTER — Emergency Department (HOSPITAL_COMMUNITY)
Admission: EM | Admit: 2013-05-07 | Discharge: 2013-05-07 | Disposition: A | Discharge status: Home / Self Care | Payer: No Typology Code available for payment source | Attending: Emergency Medicine | Admitting: Emergency Medicine

## 2013-05-07 ENCOUNTER — Encounter (HOSPITAL_COMMUNITY): Payer: Self-pay | Admitting: Emergency Medicine

## 2013-05-07 DIAGNOSIS — R5383 Other fatigue: Secondary | ICD-10-CM

## 2013-05-07 DIAGNOSIS — J029 Acute pharyngitis, unspecified: Secondary | ICD-10-CM | POA: Insufficient documentation

## 2013-05-07 DIAGNOSIS — R5381 Other malaise: Secondary | ICD-10-CM | POA: Insufficient documentation

## 2013-05-07 DIAGNOSIS — Z79899 Other long term (current) drug therapy: Secondary | ICD-10-CM | POA: Insufficient documentation

## 2013-05-07 DIAGNOSIS — Z87891 Personal history of nicotine dependence: Secondary | ICD-10-CM | POA: Insufficient documentation

## 2013-05-07 DIAGNOSIS — J45909 Unspecified asthma, uncomplicated: Secondary | ICD-10-CM | POA: Insufficient documentation

## 2013-05-07 LAB — RAPID STREP SCREEN (MED CTR MEBANE ONLY): Streptococcus, Group A Screen (Direct): NEGATIVE

## 2013-05-07 NOTE — Discharge Instructions (Signed)
Take 400-600mg  Ibuprofen (Motrin) every 6-8 hours for fever and pain  Alternate with Tylenol  Give 500 mg Tylenol every 4-6 hours as needed for fever and pain  You may also try over the counter chloraseptic spray before and after meals to help with pain. Follow-up with your primary care provider next week for recheck of symptoms if not improving.  Be sure to drink plenty of fluids and rest, at least 8hrs of sleep a night, preferably more while you are sick. Return to the ED if you cannot keep down fluids/signs of dehydration, fever not reducing with Tylenol, difficulty breathing/wheezing, stiff neck, worsening condition, or other concerns (see below)

## 2013-05-07 NOTE — ED Provider Notes (Signed)
CSN: [id number]     Arrival date & time 05/07/13  1821 History   First MD Initiated Contact with Patient 05/07/13 1903     Chief Complaint  Patient presents with  . Sore Throat     (Consider location/radiation/quality/duration/timing/severity/associated sxs/prior Treatment) Patient is a 23 y.o. female presenting with pharyngitis. The history is provided by the patient.  Sore Throat This is a new problem. The current episode started in the past 7 days. The problem occurs constantly. The problem has been unchanged. Associated symptoms include congestion, coughing, a sore throat and weakness. Pertinent negatives include no abdominal pain, chest pain, chills, fatigue, headaches, myalgias, nausea, visual change or vomiting. The symptoms are aggravated by eating, swallowing and coughing. She has tried nothing for the symptoms.   Pt is a 23yo female c/o sore throat x2 days associated with generalized weakness. Pain is sore, constant, 8/10, worse with swallowing. Denies taking pain medication at home stating "tylenol and motrin don't work for me."  Denies fever, n/v/d. Denies known sick contacts. Denies trouble breathing or swallowing.  Past Medical History  Diagnosis Date  . Asthma    No past surgical history on file. No family history on file. History  Substance Use Topics  . Smoking status: Former Smoker    Types: Cigarettes  . Smokeless tobacco: Never Used  . Alcohol Use: No   OB History   Grav Para Term Preterm Abortions TAB SAB Ect Mult Living                 Review of Systems  Constitutional: Negative for chills and fatigue.  HENT: Positive for congestion and sore throat. Negative for trouble swallowing and voice change.   Respiratory: Positive for cough. Negative for shortness of breath, wheezing and stridor.   Cardiovascular: Negative for chest pain.  Gastrointestinal: Negative for nausea, vomiting, abdominal pain and diarrhea.  Musculoskeletal: Negative for myalgias.   Neurological: Positive for weakness. Negative for headaches.  All other systems reviewed and are negative.      Allergies  Shellfish allergy  Home Medications   Current Outpatient Rx  Name  Route  Sig  Dispense  Refill  . albuterol (PROVENTIL HFA;VENTOLIN HFA) 108 (90 BASE) MCG/ACT inhaler   Inhalation   Inhale 2 puffs into the lungs every 6 (six) hours as needed for wheezing.         Marland Kitchen ibuprofen (ADVIL,MOTRIN) 200 MG tablet   Oral   Take 400-600 mg by mouth every 8 (eight) hours as needed for moderate pain.           BP 125/79  Pulse 91  Temp(Src) 98.9 F (37.2 C) (Oral)  Resp 18  SpO2 98%  LMP 03/28/2013 Physical Exam  Nursing note and vitals reviewed. Constitutional: She appears well-developed and well-nourished. No distress.  Pt appears well, non-toxic. NAD  HENT:  Head: Normocephalic and atraumatic.  Right Ear: Hearing, tympanic membrane, external ear and ear canal normal.  Left Ear: Hearing, tympanic membrane, external ear and ear canal normal.  Nose: Nose normal.  Mouth/Throat: Uvula is midline and mucous membranes are normal. Posterior oropharyngeal edema and posterior oropharyngeal erythema present. No oropharyngeal exudate or tonsillar abscesses.  Tonsillar erythema and edema w/o exudates. Uvula midline. No evidence of tonsillar abscess   Eyes: Conjunctivae are normal. No scleral icterus.  Neck: Normal range of motion. Neck supple. No JVD present. No tracheal deviation present. No thyromegaly present.  Cardiovascular: Normal rate, regular rhythm and normal heart sounds.   Pulmonary/Chest: Effort  normal and breath sounds normal. No stridor. No respiratory distress. She has no wheezes. She has no rales. She exhibits no tenderness.  No respiratory distress, able to speak in full sentences w/o difficulty. Lungs: CTAB  Abdominal: Soft. Bowel sounds are normal. She exhibits no distension and no mass. There is no tenderness. There is no rebound and no guarding.   Musculoskeletal: Normal range of motion.  Lymphadenopathy:    She has no cervical adenopathy.  Neurological: She is alert.  Skin: Skin is warm and dry. She is not diaphoretic.    ED Course  Procedures (including critical care time) Labs Review Labs Reviewed  RAPID STREP SCREEN  CULTURE, GROUP A STREP   Imaging Review No results found.   EKG Interpretation None      MDM   Final diagnoses:  Viral pharyngitis    Pt presenting with sore throat and generalized weakness. Pt appears well. Afebrile. No respiratory distress. Pt does have tonsillar erythema and edema w/o exudates. No hoarseness. No tonsillar abscess. Uvula midline. Rapid strep: negative. Will tx symptomatically for viral pharyngitis. Advised to take acetaminophen, ibuprofen and use chloraseptic throat spray as needed for pain. Advised to f/u with PCP in 2-3 days if symptoms not improving. Return precautions provided. Pt verbalized understanding and agreement with tx plan.     Junius FinnerErin O'Malley, PA-C 05/08/13 0107

## 2013-05-07 NOTE — ED Notes (Signed)
Pt c/o sore throat x 2 days w/ weakness.

## 2013-05-09 ENCOUNTER — Ambulatory Visit (INDEPENDENT_AMBULATORY_CARE_PROVIDER_SITE_OTHER): Payer: No Typology Code available for payment source | Admitting: Internal Medicine

## 2013-05-09 ENCOUNTER — Encounter: Payer: Self-pay | Admitting: Internal Medicine

## 2013-05-09 VITALS — BP 130/93 | HR 80 | Temp 98.1°F | Ht 61.0 in | Wt 292.0 lb

## 2013-05-09 DIAGNOSIS — J029 Acute pharyngitis, unspecified: Secondary | ICD-10-CM

## 2013-05-09 DIAGNOSIS — J028 Acute pharyngitis due to other specified organisms: Principal | ICD-10-CM

## 2013-05-09 DIAGNOSIS — B9789 Other viral agents as the cause of diseases classified elsewhere: Principal | ICD-10-CM

## 2013-05-09 LAB — CULTURE, GROUP A STREP

## 2013-05-09 MED ORDER — GUAIFENESIN ER 600 MG PO TB12: 600.0000 mg | ORAL_TABLET | Freq: Two times a day (BID) | ORAL | Status: DC

## 2013-05-09 NOTE — Assessment & Plan Note (Addendum)
Per pt,t with improvement of her pharyngitis. Rapid strep test negative in the ED on 3/3. No fever or lymphadenopathy on exam today, and no exudates; dry cough present. +tonsilar and oropharyngeal erythema. Symptoms are likely 2/2 viral pharyngitis complicated by post nasal drainage.  - Continue Ibuprofen 400mg  q6h PRN pain - Mucinex for the post nasal drainage   - She is to call the clinic if her symptoms persist or worsen over the weekend, and she will need further evaluation which includes to r/o STIs as the cause of her pharyngitis

## 2013-05-09 NOTE — Patient Instructions (Signed)
Take the Mucinex 2 times a day for the drainage down the back of your throat. This drainage is likely contributing to your throat pain. If you do not feel better after this weekend, please call the clinic.

## 2013-05-09 NOTE — Progress Notes (Signed)
Patient ID: Alyssa ArbourBianca L Goodman, female   DOB: 05/09/1990, 23 y.o.   MRN: [id number]  Subjective:   Patient ID: Alyssa ArbourBianca L Goodman female   DOB: 01/22/1991 22 y.o.   MRN: [id number]  HPI: Ms.Alyssa Goodman is a 23 y.o. F w/ PMH presents for ED f/u after being seen with a sore throat. She was seen on 3/3 in the ED with sore throat where a strep test was negative. She was told that her sx were likely viral in nature and she was d/c'd home without abx. She states that she continues to have intermittent pain in her throat and presents today for further evaluation.   She denies fevers or chills, headaches, cervical lymphadenopathy, difficulty swallowing, N/V, changes in bowel habits or urination. She does endorse pain around her neck, mild pain with swallowing, dry cough, and post nasal drainage.  Of note, she ate a chicken biscuit before her appointment with me today.     Past Medical History  Diagnosis Date  . Asthma    Current Outpatient Prescriptions  Medication Sig Dispense Refill  . albuterol (PROVENTIL HFA;VENTOLIN HFA) 108 (90 BASE) MCG/ACT inhaler Inhale 2 puffs into the lungs every 6 (six) hours as needed for wheezing.      Marland Kitchen. ibuprofen (ADVIL,MOTRIN) 200 MG tablet Take 400-600 mg by mouth every 8 (eight) hours as needed for moderate pain.        No current facility-administered medications for this visit.   No family history on file. History   Social History  . Marital Status: Single    Spouse Name: N/A    Number of Children: N/A  . Years of Education: 14   Occupational History  . UNEMPLOYED    Social History Main Topics  . Smoking status: Former Smoker    Types: Cigarettes  . Smokeless tobacco: Never Used  . Alcohol Use: No  . Drug Use: No  . Sexual Activity: None   Other Topics Concern  . None   Social History Narrative  . None   Review of Systems: A 12 point ROS was performed; pertinent positives and negatives were noted in the HPI   Objective:  Physical  Exam: Filed Vitals:   05/09/13 1442 05/09/13 1444  BP:  130/93  Pulse:  80  Temp:  98.1 F (36.7 C)  TempSrc:  Oral  Height: 5' 1" (1.549 m) 5' 1" (1.549 m)  Weight: 292 lb (132.45 kg) 292 lb (132.45 kg)  SpO2:  100%   Constitutional: Vital signs reviewed.  Patient is a young obese female in no acute distress and cooperative with exam.  Head: Normocephalic and atraumatic Ear: TM normal bilaterally Nose: + erythema with clear mucus. Turbinates normal Mouth: +erythema of oropharynx with edema of the tonsils. No exudate. MMM Eyes: PERRL, EOMI, conjunctivae normal, No scleral icterus.  Neck: Supple, Trachea midline normal ROM Cardiovascular: RRR, no MRG, pulses symmetric and intact bilaterally Pulmonary/Chest: Normal respiratory effort, CTAB, no wheezes, rales, or rhonchi. Occasional dry cough present Abdominal: Soft. Non-tender, obese Musculoskeletal: No joint deformities, erythema Hematology: No cervical adenopathy.  Neurological: A&O x3, nonfocal Skin: Warm, dry and intact.  Psychiatric: Normal mood and affect. speech and behavior is normal.  Assessment & Plan:  Her BP was slightly elevated this visit, but she is not feeling well today, which I feel is contributing to her increased BP. Will reevaluate her BP at her f/u appt in May.  Please refer to Problem List based Assessment and Plan being seen with a sore throat.   She was seen on 3/3 in the ED with sore throat where a strep test was negative. She was told that her sx were likely viral in nature and she was d/c'd home without abx. She states that she continues to have intermittent pain in her throat and presents today for further evaluation.   She denies fevers or chills, headaches, cervical lymphadenopathy, difficulty swallowing, N/V, changes in bowel habits or urination. She does endorse pain around her neck, mild pain with swallowing, dry cough, and post nasal drainage.  Of note, she ate a chicken biscuit before her appointment with me today.     Past Medical History  Diagnosis Date  . Asthma    Current Outpatient Prescriptions  Medication Sig Dispense Refill  . albuterol (PROVENTIL HFA;VENTOLIN HFA) 108 (90 BASE) MCG/ACT inhaler Inhale 2 puffs into the lungs every 6 (six) hours as needed for wheezing.      Marland Kitchen. ibuprofen (ADVIL,MOTRIN) 200 MG tablet Take 400-600 mg by mouth every 8 (eight) hours as needed for moderate pain.        No current facility-administered medications for this visit.   No family history on file. History   Social History  . Marital Status: Single    Spouse Name: N/A    Number of Children: N/A  . Years of Education: 14   Occupational History  . UNEMPLOYED    Social History Main Topics  . Smoking status: Former Smoker    Types: Cigarettes  . Smokeless tobacco: Never Used  . Alcohol Use: No  . Drug Use: No  . Sexual Activity: None   Other Topics Concern  . None   Social History Narrative  . None   Review of Systems: A 12 point ROS was performed; pertinent positives and negatives were noted in the HPI   Objective:  Physical  Exam: Filed Vitals:   05/09/13 1442 05/09/13 1444  BP:  130/93  Pulse:  80  Temp:  98.1 F (36.7 C)  TempSrc:  Oral  Height: 5\' 1"  (1.549 m) 5\' 1"  (1.549 m)  Weight: 292 lb (132.45 kg) 292 lb (132.45 kg)  SpO2:  100%   Constitutional: Vital signs reviewed.  Patient is a young obese female in no acute distress and cooperative with exam.  Head: Normocephalic and atraumatic Ear: TM normal bilaterally Nose: + erythema with clear mucus. Turbinates normal Mouth: +erythema of oropharynx with edema of the tonsils. No exudate. MMM Eyes: PERRL, EOMI, conjunctivae normal, No scleral icterus.  Neck: Supple, Trachea midline normal ROM Cardiovascular: RRR, no MRG, pulses symmetric and intact bilaterally Pulmonary/Chest: Normal respiratory effort, CTAB, no wheezes, rales, or rhonchi. Occasional dry cough present Abdominal: Soft. Non-tender, obese Musculoskeletal: No joint deformities, erythema Hematology: No cervical adenopathy.  Neurological: A&O x3, nonfocal Skin: Warm, dry and intact.  Psychiatric: Normal mood and affect. speech and behavior is normal.  Assessment & Plan:  Her BP was slightly elevated this visit, but she is not feeling well today, which I feel is contributing to her increased BP. Will reevaluate her BP at her f/u appt in May.  Please refer to Problem List based Assessment and Plan

## 2013-05-10 NOTE — Progress Notes (Signed)
Case discussed with Dr. Glenn soon after the resident saw the patient.  We reviewed the resident's history and exam and pertinent patient test results.  I agree with the assessment, diagnosis and plan of care documented in the resident's note. 

## 2013-05-10 NOTE — Progress Notes (Signed)
ED Antimicrobial Stewardship Positive Culture Follow Up   Alyssa ArbourBianca L Goodman is an 23 y.o. female who [facility] with a chief complaint of  Chief Complaint  Patient presents with  . Sore Throat    Recent Results (from the past 720 hour(s))  RAPID STREP SCREEN     Status: None   Collection Time    05/07/13  7:44 PM      Result Value Ref Range Status   Streptococcus, Group A Screen (Direct) NEGATIVE  NEGATIVE Final   Comment: (NOTE)     A Rapid Antigen test may result negative if the antigen level in the     sample is below the detection level of this test. The FDA has not     cleared this test as a stand-alone test therefore the rapid antigen     negative result has reflexed to a Group A Strep culture.  CULTURE, GROUP A STREP     Status: None   Collection Time    05/07/13  7:44 PM      Result Value Ref Range Status   Specimen Description THROAT   Final   Special Requests NONE   Final   Culture     Final   Value: STREPTOCOCCUS,BETA HEMOLYTIC NOT GROUP A     Performed at [facility]   Report Status 05/09/2013 FINAL   Final    []  Treated with , organism resistant to prescribed antimicrobial [x]  Patient discharged originally without antimicrobial agent and treatment is now indicated  Recommendation: Perform symptom check. If symptoms improving, no treatment indicated. If symptoms persist/worsening, treat with Amoxicillin 1000mg  PO daily x 10 days.  ED Provider: Roxy Horsemanobert Browning, PA-C   Cleon DewDulaney, [place] 05/10/2013, 9:58 AM Infectious Diseases Pharmacist Phone# [phone]

## 2013-05-11 ENCOUNTER — Telehealth (HOSPITAL_BASED_OUTPATIENT_CLINIC_OR_DEPARTMENT_OTHER): Payer: Self-pay | Admitting: Emergency Medicine

## 2013-05-11 NOTE — Telephone Encounter (Signed)
Post ED Visit - Positive Culture Follow-up: Successful Patient Follow-Up  Culture assessed and recommendations reviewed by: [x]  Wes Dulaney, Pharm.D., BCPS []  Celedonio MiyamotoJeremy Frens, [address] BCPS []  Georgina PillionElizabeth Martin, [address] BCPS []  TulareMinh Pham, [address] BCPS, AAHIVP []  Estella HuskMichelle Turner, Pharm.D., BCPS, AAHIVP  Positive strep culture  [x]  Patient discharged without antimicrobial prescription and treatment is now indicated []  Organism is resistant to prescribed ED discharge antimicrobial []  Patient with positive blood cultures  Changes discussed with ED provider: Ivar Drapeob Browning PA-C New antibiotic prescription: If symptomatic, Amoxicillin 1000 mg PO daily x 10 days    Iyanna Drummer 05/11/2013, 12:03 PM

## 2013-05-14 ENCOUNTER — Ambulatory Visit (INDEPENDENT_AMBULATORY_CARE_PROVIDER_SITE_OTHER): Payer: No Typology Code available for payment source | Admitting: Internal Medicine

## 2013-05-14 VITALS — BP 116/75 | HR 91 | Temp 97.0°F | Wt 290.7 lb

## 2013-05-14 DIAGNOSIS — A5901 Trichomonal vulvovaginitis: Secondary | ICD-10-CM

## 2013-05-14 MED ORDER — TINIDAZOLE 500 MG PO TABS: 2.0000 g | ORAL_TABLET | Freq: Once | ORAL | Status: DC

## 2013-05-14 NOTE — Patient Instructions (Addendum)
We will treat you with a medicine similar to Flagyl. Take 4 pills all at once. Contact the clinic if your symptoms do not resolve in 3 days. It is important for your partner to be treated as well.

## 2013-05-14 NOTE — Progress Notes (Signed)
   Subjective:    Patient ID: Alyssa ArbourBianca L Goodman, female    DOB: 11/26/1990, 23 y.o.   MRN: [id number]  HPI  States that over the past week developed fishy odor now with white cheesy discharge x 2 days.  Hx significant for recent Trichomonas vaginitis tx with Flagyl 2 g x1.  Denies abdominal pain, vaginal bleeding, dysuria or fever.  Review of Systems  Constitutional: Negative for fever.  HENT: Negative.   Respiratory: Negative.   Cardiovascular: Negative.   Genitourinary: Positive for vaginal bleeding and vaginal discharge. Negative for dysuria, hematuria, genital sores and pelvic pain.  Allergic/Immunologic: Negative.   Neurological: Negative.   Hematological: Negative.   Psychiatric/Behavioral: Negative.        Objective:   Physical Exam  Constitutional: She is oriented to person, place, and time. She appears well-developed and well-nourished. No distress.  HENT:  Head: Normocephalic and atraumatic.  Eyes: Conjunctivae and EOM are normal. Pupils are equal, round, and reactive to light.  Neck: Normal range of motion.  Cardiovascular: Normal rate, regular rhythm, normal heart sounds and intact distal pulses.   No murmur heard. Pulmonary/Chest: Effort normal and breath sounds normal.  Abdominal: Soft. Bowel sounds are normal. She exhibits no mass. There is no tenderness.  obese  Genitourinary: Uterus normal. There is no lesion on the right labia. There is no lesion on the left labia. Cervix exhibits discharge. Cervix exhibits no friability. Right adnexum displays no tenderness. Left adnexum displays no tenderness. No bleeding around the vagina. Vaginal discharge found.  Neurological: She is alert and oriented to person, place, and time.  Skin: Skin is warm and dry.          Assessment & Plan:  See separate problem-list charting:

## 2013-05-14 NOTE — Assessment & Plan Note (Addendum)
Pelvic exam with fishy odor and thin whitish discharge. Check GC/CHlam, Trich, BV  Will retreat with tinidazole 2 g x1 Will get HIV test today. Counsel on safe sex.

## 2013-05-15 LAB — CERVICOVAGINAL ANCILLARY ONLY
CHLAMYDIA, DNA PROBE: NEGATIVE
Neisseria Gonorrhea: NEGATIVE
WET PREP (BD AFFIRM): NEGATIVE
Wet Prep (BD Affirm): NEGATIVE
Wet Prep (BD Affirm): POSITIVE — AB

## 2013-05-15 LAB — HIV ANTIBODY (ROUTINE TESTING W REFLEX): HIV: NONREACTIVE

## 2013-05-15 NOTE — Progress Notes (Signed)
Case discussed with Dr. Schooler soon after the resident saw the patient.  We reviewed the resident's history and exam and pertinent patient test results.  I agree with the assessment, diagnosis and plan of care documented in the resident's note. 

## 2013-05-15 NOTE — ED Provider Notes (Signed)
Medical screening examination/treatment/procedure(s) were performed by non-physician practitioner and as supervising physician I was immediately available for consultation/collaboration.   EKG Interpretation None        Rolland PorterMark Diamon Reddinger, MD 05/15/13 [phone]

## 2013-06-20 ENCOUNTER — Ambulatory Visit (INDEPENDENT_AMBULATORY_CARE_PROVIDER_SITE_OTHER): Payer: No Typology Code available for payment source | Admitting: Internal Medicine

## 2013-06-20 ENCOUNTER — Encounter: Payer: Self-pay | Admitting: Internal Medicine

## 2013-06-20 VITALS — BP 125/81 | HR 78 | Temp 97.6°F | Wt 289.8 lb

## 2013-06-20 DIAGNOSIS — N760 Acute vaginitis: Secondary | ICD-10-CM

## 2013-06-20 DIAGNOSIS — R350 Frequency of micturition: Secondary | ICD-10-CM

## 2013-06-20 DIAGNOSIS — B9689 Other specified bacterial agents as the cause of diseases classified elsewhere: Secondary | ICD-10-CM

## 2013-06-20 DIAGNOSIS — Z0000 Encounter for general adult medical examination without abnormal findings: Secondary | ICD-10-CM

## 2013-06-20 DIAGNOSIS — N898 Other specified noninflammatory disorders of vagina: Secondary | ICD-10-CM

## 2013-06-20 DIAGNOSIS — A499 Bacterial infection, unspecified: Secondary | ICD-10-CM

## 2013-06-20 LAB — POCT URINE PREGNANCY: Preg Test, Ur: NEGATIVE

## 2013-06-20 MED ORDER — METRONIDAZOLE 500 MG PO TABS: 500.0000 mg | ORAL_TABLET | Freq: Two times a day (BID) | ORAL | Status: DC

## 2013-06-20 NOTE — Patient Instructions (Signed)
-  Start taking metronidazole 500mg  twice per day, take it for 7 days. Avoid drinking alcohol while taking this medicine.  -Start using gentle mild soaps instead of scented soaps.     Bacterial Vaginosis Bacterial vaginosis is a vaginal infection that occurs when the normal balance of bacteria in the vagina is disrupted. It results from an overgrowth of certain bacteria. This is the most common vaginal infection in women of childbearing age. Treatment is important to prevent complications, especially in pregnant women, as it can cause a premature delivery. CAUSES  Bacterial vaginosis is caused by an increase in harmful bacteria that are normally present in smaller amounts in the vagina. Several different kinds of bacteria can cause bacterial vaginosis. However, the reason that the condition develops is not fully understood. RISK FACTORS Certain activities or behaviors can put you at an increased risk of developing bacterial vaginosis, including:  Having a new sex partner or multiple sex partners.  Douching.  Using an intrauterine device (IUD) for contraception. Women do not get bacterial vaginosis from toilet seats, bedding, swimming pools, or contact with objects around them. SIGNS AND SYMPTOMS  Some women with bacterial vaginosis have no signs or symptoms. Common symptoms include:  Grey vaginal discharge.  A fishlike odor with discharge, especially after sexual intercourse.  Itching or burning of the vagina and vulva.  Burning or pain with urination. DIAGNOSIS  Your health care provider will take a medical history and examine the vagina for signs of bacterial vaginosis. A sample of vaginal fluid may be taken. Your health care provider will look at this sample under a microscope to check for bacteria and abnormal cells. A vaginal pH test may also be done.  TREATMENT  Bacterial vaginosis may be treated with antibiotic medicines. These may be given in the form of a pill or a vaginal  cream. A second round of antibiotics may be prescribed if the condition comes back after treatment.  HOME CARE INSTRUCTIONS   Only take over-the-counter or prescription medicines as directed by your health care provider.  If antibiotic medicine was prescribed, take it as directed. Make sure you finish it even if you start to feel better.  Do not have sex until treatment is completed.  Tell all sexual partners that you have a vaginal infection. They should see their health care provider and be treated if they have problems, such as a mild rash or itching.  Practice safe sex by using condoms and only having one sex partner. SEEK MEDICAL CARE IF:   Your symptoms are not improving after 3 days of treatment.  You have increased discharge or pain.  You have a fever. MAKE SURE YOU:   Understand these instructions.  Will watch your condition.  Will get help right away if you are not doing well or get worse. FOR MORE INFORMATION  Centers for Disease Control and Prevention, Division of STD Prevention: SolutionApps.co.zawww.cdc.gov/std American Sexual Health Association (ASHA): www.ashastd.org  Document Released: 02/21/2005 Document Revised: 12/12/2012 Document Reviewed: 10/03/2012 [facility] Patient Information 2014 [employer] [place].

## 2013-06-21 DIAGNOSIS — R350 Frequency of micturition: Secondary | ICD-10-CM | POA: Insufficient documentation

## 2013-06-21 DIAGNOSIS — N760 Acute vaginitis: Secondary | ICD-10-CM

## 2013-06-21 DIAGNOSIS — B9689 Other specified bacterial agents as the cause of diseases classified elsewhere: Secondary | ICD-10-CM | POA: Insufficient documentation

## 2013-06-21 LAB — URINALYSIS, MICROSCOPIC ONLY
Bacteria, UA: NONE SEEN
Casts: NONE SEEN
Crystals: NONE SEEN
Squamous Epithelial / LPF: NONE SEEN
WBC, UA: >50 WBC/hpf — AB (ref <3)

## 2013-06-21 LAB — URINALYSIS, ROUTINE W REFLEX MICROSCOPIC
Bilirubin Urine: NEGATIVE
Glucose, UA: NEGATIVE mg/dL
Hgb urine dipstick: NEGATIVE
Ketones, ur: NEGATIVE mg/dL
NITRITE: NEGATIVE
Protein, ur: 30 mg/dL — AB
SPECIFIC GRAVITY, URINE: 1.024 (ref 1.005–1.030)
Urobilinogen, UA: 0.2 mg/dL (ref 0.0–1.0)
pH: 6 (ref 5.0–8.0)

## 2013-06-21 LAB — CERVICOVAGINAL ANCILLARY ONLY
Chlamydia: NEGATIVE
NEISSERIA GONORRHEA: NEGATIVE
WET PREP (BD AFFIRM): NEGATIVE
WET PREP (BD AFFIRM): NEGATIVE
WET PREP (BD AFFIRM): POSITIVE — AB

## 2013-06-21 NOTE — Assessment & Plan Note (Signed)
POC pregnancy negative. UA with large leucocytes, >50 WBC.  Sent for urine culture

## 2013-06-21 NOTE — Assessment & Plan Note (Addendum)
Patient given information in regards to Gardasil vaccine which is recommended to women up to 22 years of age.  Pt advised to call the [facility] Department for vaccination appointment.  -pt needs lipid panel -pt needs diabetes screening due to morbid obesity

## 2013-06-21 NOTE — Assessment & Plan Note (Addendum)
She had pelvic examination last month with GC/Chlamydia and Candida/Tric all negative. She did test positive for Gardnerella with BV the likely explanation for her symptoms. She was treated with tindamax 2g once which has lower efficacy  then flagyl 500mg  BID x 7 days.  Her current symptoms are very similar to what she had last month with persistent BV likely.  Repeat GC/Chlamydia, Candida and trich test all negative.  Gardnerella positive again.  Rx flagyl 500mg  BID x 7days, pt advised to abstain from alcohol during this time.  UA with large leucocytes, large WBC-->will send for urine culture

## 2013-06-21 NOTE — Progress Notes (Signed)
   Subjective:    Patient ID: Alyssa ArbourBianca L Goodman, female    DOB: 12/22/1990, 23 y.o.   MRN: [id number]  Vaginal Discharge The patient's primary symptoms include a vaginal discharge. Associated symptoms include frequency. Pertinent negatives include no abdominal pain, back pain, chills, constipation, diarrhea, dysuria, fever, headaches, nausea or rash.   Alyssa Goodman is a pleasant 23 yr old woman with PMH of asthma and morbid obesity who presents for evaluation fo vaginal discharge. She has had scant white/clear, malodorous vaginal discharge for one week. Her LMP was on 4/5. She is sexually active nut uses protection. She is concerned she may be pregnancy due to increased fatigue and urinary frequency, she had unprotected sexual intercourse 6 weeks ago. Her LMP was of normal flow and duration but she would like a repeat pregnancy test during this visit.  Of note, [facility] for the same complaint of vaginal discharge and was treated with tinidazole once. She reports that he discharge went away but returned last week.    Review of Systems  Constitutional: Positive for fatigue. Negative for fever, chills, diaphoresis, activity change, appetite change and unexpected weight change.  Respiratory: Negative for cough and shortness of breath.   Cardiovascular: Negative for chest pain and leg swelling.  Gastrointestinal: Negative for nausea, abdominal pain, diarrhea and constipation.  Genitourinary: Positive for frequency and vaginal discharge. Negative for dysuria and vaginal pain.  Musculoskeletal: Negative for back pain.  Skin: Negative for color change, pallor, rash and wound.  Neurological: Negative for dizziness, weakness and headaches.  Psychiatric/Behavioral: Negative for agitation.       Objective:   Physical Exam  Nursing note and vitals reviewed. Constitutional: She is oriented to person, place, and time. No distress.  Morbidly obese  Cardiovascular: Normal rate.     Pulmonary/Chest: Effort normal. No respiratory distress.  Abdominal: There is no tenderness. There is no rebound and no guarding.  Genitourinary: Vaginal discharge found.  Pelvic exam performed, yellow/gray discharge with fishy odor present, no bleeding, no cervical motion tenderness, no vulvar or vaginal lesions noted.   Neurological: She is alert and oriented to person, place, and time.  Skin: Skin is warm and dry. No rash noted. She is not diaphoretic.  Psychiatric: She has a normal mood and affect. Her behavior is normal.          Assessment & Plan:

## 2013-06-24 NOTE — Progress Notes (Signed)
INTERNAL MEDICINE TEACHING ATTENDING ADDENDUM - Camary Sosa, MD: I reviewed and discussed at the time of visit with the resident Dr. Kennerly, the patient's medical history, physical examination, diagnosis and results of tests and treatment and I agree with the patient's care as documented. 

## 2013-06-26 ENCOUNTER — Telehealth: Payer: Self-pay | Admitting: *Deleted

## 2013-06-26 NOTE — Telephone Encounter (Signed)
Pt called on generic Flagyl - hives and itching all over body - has 2 days left of meds. Suggest to stop med and take Benadryl as directed. Pt wants to be checked for vaginal infection Appt made by  M. Rcom. Stanton KidneyDebra Laquenta Whitsell RN 06/26/13 4:30PM

## 2013-06-27 ENCOUNTER — Ambulatory Visit: Payer: No Typology Code available for payment source | Admitting: Internal Medicine

## 2013-06-27 ENCOUNTER — Emergency Department (HOSPITAL_COMMUNITY)
Admission: EM | Admit: 2013-06-27 | Discharge: 2013-06-27 | Disposition: A | Discharge status: Home / Self Care | Payer: No Typology Code available for payment source | Attending: Emergency Medicine | Admitting: Emergency Medicine

## 2013-06-27 ENCOUNTER — Encounter (HOSPITAL_COMMUNITY): Payer: Self-pay | Admitting: Emergency Medicine

## 2013-06-27 DIAGNOSIS — T7840XA Allergy, unspecified, initial encounter: Secondary | ICD-10-CM

## 2013-06-27 DIAGNOSIS — T373X5A Adverse effect of other antiprotozoal drugs, initial encounter: Secondary | ICD-10-CM | POA: Insufficient documentation

## 2013-06-27 DIAGNOSIS — J45909 Unspecified asthma, uncomplicated: Secondary | ICD-10-CM | POA: Insufficient documentation

## 2013-06-27 DIAGNOSIS — Z79899 Other long term (current) drug therapy: Secondary | ICD-10-CM | POA: Insufficient documentation

## 2013-06-27 DIAGNOSIS — L509 Urticaria, unspecified: Secondary | ICD-10-CM | POA: Insufficient documentation

## 2013-06-27 DIAGNOSIS — Z87891 Personal history of nicotine dependence: Secondary | ICD-10-CM | POA: Insufficient documentation

## 2013-06-27 MED ORDER — DIPHENHYDRAMINE HCL 25 MG PO TABS: 50.0000 mg | ORAL_TABLET | ORAL | Status: DC | PRN

## 2013-06-27 MED ORDER — PREDNISONE 20 MG PO TABS: ORAL_TABLET | ORAL | Status: DC

## 2013-06-27 MED ORDER — FAMOTIDINE 20 MG PO TABS: 20.0000 mg | ORAL_TABLET | Freq: Two times a day (BID) | ORAL | Status: DC

## 2013-06-27 NOTE — Discharge Instructions (Signed)
Allergies  Allergies may happen from anything your body is sensitive to. This may be food, medicines, pollens, chemicals, and many other things. Food allergies can be severe and deadly.  HOME CARE  If you do not know what causes a reaction, keep a diary. Write down the foods you ate and the symptoms that followed. Avoid foods that cause reactions.  If you have red raised spots (hives) or a rash:  Take medicine as told by your doctor.  Use medicines for red raised spots and itching as needed.  Apply cold cloths (compresses) to the skin. Take a cool bath. Avoid hot baths or showers.  If you are severely allergic:  It is often necessary to go to the hospital after you have treated your reaction.  Wear your medical alert jewelry.  You and your family must learn how to give a allergy shot or use an allergy kit (anaphylaxis kit).  Always carry your allergy kit or shot with you. Use this medicine as told by your doctor if a severe reaction is occurring. GET HELP RIGHT AWAY IF:  You have trouble breathing or are making high-pitched whistling sounds (wheezing).  You have a tight feeling in your chest or throat.  You have a puffy (swollen) mouth.  You have red raised spots, puffiness (swelling), or itching all over your body.  You have had a severe reaction that was helped by your allergy kit or shot. The reaction can return once the medicine has worn off.  You think you are having a food allergy. Symptoms most often happen within 30 minutes of eating a food.  Your symptoms have not gone away within 2 days or are getting worse.  You have new symptoms.  You want to retest yourself with a food or drink you think causes an allergic reaction. Only do this under the care of a doctor. MAKE SURE YOU:   Understand these instructions.  Will watch your condition.  Will get help right away if you are not doing well or get worse. Document Released: 06/18/2012 Document Reviewed:  06/18/2012 [facility] Patient Information 2014 [place], [place].  Drug Allergy Allergic reactions to medicines are common. Some allergic reactions are mild. A delayed type of drug allergy that occurs 1 week or more after exposure to a medicine or vaccine is called serum sickness. A life-threatening, sudden (acute) allergic reaction that involves the whole body is called anaphylaxis. CAUSES  "True" drug allergies occur when there is an allergic reaction to a medicine. This is caused by overactivity of the immune system. First, the body becomes sensitized. The immune system is triggered by your first exposure to the medicine. Following this first exposure, future exposure to the same medicine may be life-threatening. Almost any medicine can cause an allergic reaction. Common ones are:  Penicillin.  Sulfonamides (sulfa drugs).  Local anesthetics.  X-ray dyes that contain iodine. SYMPTOMS  Common symptoms of a minor allergic reaction are:  Swelling around the mouth.  An itchy red rash or hives.  Vomiting or diarrhea. Anaphylaxis can cause swelling of the mouth and throat. This makes it difficult to breathe and swallow. Severe reactions can be fatal within seconds, even after exposure to only a trace amount of the drug that causes the reaction. HOME CARE INSTRUCTIONS   If you are unsure of what caused your reaction, keep a diary of foods and medicines used. Include the symptoms that followed. Avoid anything that causes reactions.  You may want to follow up with an allergy  specialist after the reaction has cleared in order to be tested to confirm the allergy. It is important to confirm that your reaction is an allergy, not just a side effect to the medicine. If you have a true allergy to a medicine, this may prevent that medicine and related medicines from being given to you when you are very ill.  If you have hives or a rash:  Take medicines as directed by your caregiver.  You may use an  over-the-counter antihistamine (diphenhydramine) as needed.  Apply cold compresses to the skin or take baths in cool water. Avoid hot baths or showers.  If you are severely allergic:  Continuous observation after a severe reaction may be needed. Hospitalization is often required.  Wear a medical alert bracelet or necklace stating your allergy.  You and your family must learn how to use an anaphylaxis kit or give an epinephrine injection to temporarily treat an emergency allergic reaction. If you have had a severe reaction, always carry your epinephrine injection or anaphylaxis kit with you. This can be lifesaving if you have a severe reaction.  Do not drive or perform tasks after treatment until the medicines used to treat your reaction have worn off, or until your caregiver says it is okay. SEEK MEDICAL CARE IF:   You think you had an allergic reaction. Symptoms usually start within 30 minutes after exposure.  Symptoms are getting worse rather than better.  You develop new symptoms.  The symptoms that brought you to your caregiver return. SEEK IMMEDIATE MEDICAL CARE IF:   You have swelling of the mouth, difficulty breathing, or wheezing.  You have a tight feeling in your chest or throat.  You develop hives, swelling, or itching all over your body.  You develop severe vomiting or diarrhea.  You feel faint or pass out. This is an emergency. Use your epinephrine injection or anaphylaxis kit as you have been instructed. Call for emergency medical help. Even if you improve after the injection, you need to be examined at a hospital emergency department. MAKE SURE YOU:   Understand these instructions.  Will watch your condition.  Will get help right away if you are not doing well or get worse. Document Released: 02/21/2005 Document Revised: 05/16/2011 Document Reviewed: 07/28/2010 [facility] Patient Information 2014 [place], [place].

## 2013-06-27 NOTE — ED Provider Notes (Signed)
CSN: [id number]     Arrival date & time 06/27/13  0905 History  This chart was scribed for non-physician practitioner working with [name], by Tana ConchStephen Methvin ED Scribe. This patient was seen in TR09C/TR09C and the patient's care was started at 10:30 AM.    Chief Complaint  Patient presents with  . Rash      The history is provided by the patient. No language interpreter was used.     HPI Comments: Alyssa Goodman is a 23 y.o. female who presents to the Emergency Department complaining of a worsening rash that began when she started taking a prescribed Flagyl on Saturday . She states that the rash is "everywhere" and is "itchy". She stopped taking the Flagyl yesterday morning per the recommendation of her physician. The Flagyl is her only new medication. No new foods, soaps, detergents. She was initially on the Flagyl for BV. She has an appointment tomorrow with her PCP to repeat the pelvic exam to determine if treatment is still necessary. She reports taking Benadryl yesterday.  She denies sensation that her throat is closing, shortness of breath. She has had a mild cough.    Past Medical History  Diagnosis Date  . Asthma    No past surgical history on file. No family history on file. History  Substance Use Topics  . Smoking status: Former Smoker    Types: Cigarettes  . Smokeless tobacco: Never Used  . Alcohol Use: No   OB History   Grav Para Term Preterm Abortions TAB SAB Ect Mult Living                 Review of Systems  Respiratory: Positive for cough.   Skin: Positive for rash.  All other systems reviewed and are negative.     Allergies  Shellfish allergy  Home Medications   Prior to Admission medications   Medication Sig Start Date End Date Taking? Authorizing Provider  albuterol (PROVENTIL HFA;VENTOLIN HFA) 108 (90 BASE) MCG/ACT inhaler Inhale 2 puffs into the lungs every 6 (six) hours as needed for wheezing.    Historical Provider, MD  guaiFENesin  (MUCINEX) 600 MG 12 hr tablet Take 1 tablet (600 mg total) by mouth 2 (two) times daily. 05/09/13 05/09/14  [name], MD  ibuprofen (ADVIL,MOTRIN) 200 MG tablet Take 400-600 mg by mouth every 8 (eight) hours as needed for moderate pain.     Historical Provider, MD  metroNIDAZOLE (FLAGYL) 500 MG tablet Take 1 tablet (500 mg total) by mouth 2 (two) times daily. 06/20/13   [name], MD   BP 132/83  Pulse 88  Temp(Src) 97.6 F (36.4 C) (Oral)  Resp 15  Ht 5\' 1"  (1.549 m)  Wt 288 lb (130.636 kg)  BMI 54.45 kg/m2  SpO2 100%  LMP 06/09/2013 Physical Exam  Nursing note and vitals reviewed. Constitutional: She is oriented to person, place, and time. She appears well-developed and well-nourished. No distress.  NAD  HENT:  Head: Normocephalic and atraumatic.  Right Ear: External ear normal.  Left Ear: External ear normal.  Nose: Nose normal.  Mouth/Throat: Oropharynx is clear and moist.  Maintaining own secretions  Eyes: Conjunctivae are normal.  Neck: Normal range of motion. No rigidity.  Cardiovascular: Normal rate, regular rhythm and normal heart sounds.   Pulmonary/Chest: Effort normal and breath sounds normal. No stridor. No respiratory distress. She has no wheezes. She has no rales.  Speaking in full sentences  Abdominal: Soft. She exhibits no distension. There  is no tenderness.  Musculoskeletal: Normal range of motion.  Neurological: She is alert and oriented to person, place, and time. She has normal strength.  Skin: Skin is warm and dry. Rash noted. Rash is urticarial. She is not diaphoretic. No erythema.  Diffuse urticarial rash. Spares face.   Psychiatric: She has a normal mood and affect. Her behavior is normal.    ED Course  Procedures (including critical care time)  DIAGNOSTIC STUDIES: Oxygen Saturation is 100% on RA, normal by my interpretation.    COORDINATION OF CARE:    10:32 AM-Discussed treatment plan which includes  with pt at bedside and pt  agreed to plan.   Labs Review Labs Reviewed - No data to display  Imaging Review No results found.   EKG Interpretation None      MDM   Final diagnoses:  Allergic reaction   Patient evaluated prior to dc, is hemodynamically stable, in no respiratory distress, and denies the feeling of throat closing. Pt has been advised to take OTC benadryl & return to the ED if they have a mod-severe allergic rxn (s/s including throat closing, difficulty breathing, swelling of lips face or tongue). Pt was given prednisone burst, pepcid, and benadryl to take for the next 5 days. Advised to not take any more Flagyl. Pt is to follow up with their PCP. Pt is agreeable with plan & verbalizes understanding.   I personally performed the services described in this documentation, which was scribed in my presence. The recorded information has been reviewed and is accurate.     [name], PA-C 06/27/13 1302

## 2013-06-27 NOTE — ED Notes (Addendum)
Pt reports rash started 2 days ago.Rash started after taking metronidazole ,last dose Wed AM.

## 2013-06-27 NOTE — Discharge Planning (Signed)
[facility] Alyssa Goodman, Community Liaison  Patient is [facility]. Patient states she was recently seen by her PCP and has an upcoming appointment. I explained the importance of following up with her PCP. My contact information was provided for any future questions or concerns.

## 2013-06-28 ENCOUNTER — Ambulatory Visit (INDEPENDENT_AMBULATORY_CARE_PROVIDER_SITE_OTHER): Payer: No Typology Code available for payment source | Admitting: Internal Medicine

## 2013-06-28 ENCOUNTER — Encounter: Payer: Self-pay | Admitting: Internal Medicine

## 2013-06-28 VITALS — BP 125/86 | HR 83 | Temp 98.7°F | Wt 290.8 lb

## 2013-06-28 DIAGNOSIS — L509 Urticaria, unspecified: Secondary | ICD-10-CM

## 2013-06-28 DIAGNOSIS — B9689 Other specified bacterial agents as the cause of diseases classified elsewhere: Secondary | ICD-10-CM

## 2013-06-28 DIAGNOSIS — Z131 Encounter for screening for diabetes mellitus: Secondary | ICD-10-CM

## 2013-06-28 DIAGNOSIS — Z13220 Encounter for screening for lipoid disorders: Secondary | ICD-10-CM

## 2013-06-28 DIAGNOSIS — A499 Bacterial infection, unspecified: Secondary | ICD-10-CM

## 2013-06-28 DIAGNOSIS — R350 Frequency of micturition: Secondary | ICD-10-CM

## 2013-06-28 DIAGNOSIS — Z0000 Encounter for general adult medical examination without abnormal findings: Secondary | ICD-10-CM

## 2013-06-28 DIAGNOSIS — N760 Acute vaginitis: Secondary | ICD-10-CM

## 2013-06-28 LAB — GLUCOSE, CAPILLARY: GLUCOSE-CAPILLARY: 82 mg/dL (ref 70–99)

## 2013-06-28 LAB — POCT GLYCOSYLATED HEMOGLOBIN (HGB A1C): HEMOGLOBIN A1C: 5.5

## 2013-06-28 NOTE — Patient Instructions (Signed)
-  Continue taking the Benadryl, Pepcid, and prednisone as prescribed.  -Call us next week if the rash is not improving.  -Follow up with Dr. Sherrine MaplesGlenn on May 5/5 for vaccinations and follow up visit for your rash.   Please bring your medicines with you each time you come.   Medicines may be  Eye drops  Herbal   Vitamins  Pills  Seeing these help us take care of you.

## 2013-06-29 LAB — LIPID PANEL
CHOLESTEROL: 132 mg/dL (ref 0–200)
HDL: 33 mg/dL — ABNORMAL LOW (ref >39)
LDL Cholesterol: 67 mg/dL (ref 0–99)
TRIGLYCERIDES: 158 mg/dL — AB (ref <150)
Total CHOL/HDL Ratio: 4 Ratio
VLDL: 32 mg/dL (ref 0–40)

## 2013-06-29 NOTE — ED Provider Notes (Signed)
Medical screening examination/treatment/procedure(s) were performed by non-physician practitioner and as supervising physician I was immediately available for consultation/collaboration.    Brayten Komar L Merrilyn Legler, MD 06/29/13 1002 

## 2013-06-30 DIAGNOSIS — Z131 Encounter for screening for diabetes mellitus: Secondary | ICD-10-CM | POA: Insufficient documentation

## 2013-06-30 DIAGNOSIS — L509 Urticaria, unspecified: Secondary | ICD-10-CM | POA: Insufficient documentation

## 2013-06-30 DIAGNOSIS — Z13220 Encounter for screening for lipoid disorders: Secondary | ICD-10-CM | POA: Insufficient documentation

## 2013-06-30 NOTE — Assessment & Plan Note (Addendum)
Screened for diabetes with Hg A1C of 5.5%, she does not have diabetes.

## 2013-06-30 NOTE — Assessment & Plan Note (Signed)
Screened for hyperlipidemia with t chol of 132, LDL of 67, HDL of 33, she has 0-1 risk factors for CAD (low HDL) with no indication for initiation of statin therapy at this time.

## 2013-06-30 NOTE — Assessment & Plan Note (Signed)
Resolved. She denies symptoms. Of note, metronidazole added to her list of medication allergies. She may use clindamycin vaginal cream in the future if she has recurrence of BV.

## 2013-06-30 NOTE — Assessment & Plan Note (Signed)
She has a urticarial rash that developed after initiation of treatment with metronidazole for BV. Her rash is improving with prednisone (taper of 30mg  on day 1, 20mg  on day 2,  then 10mg  x4days). She is taking Benadryl 50mg  q4 as needed and famotidine.  She notes that the rash is improving.  Pt will call next week if rash is not improving.  She was advised to go to the ED if she has throat closing, dyspnea, or worsening of her rash.

## 2013-06-30 NOTE — Progress Notes (Signed)
   Subjective:    Patient ID: Alyssa Goodman, female    DOB: 06/18/1990, 23 y.o.   MRN: [id number]  HPI Ms. Logan Boresvans is a 23 yr old woman with PMH of morbid obesity, chronic asthma, who presents for evaluation of urticarial rash that started 5 day after taking metronidazole. She was seen be me last week and was started on treatment for bacterial vaginosis with metronidazole 500mg  twice per day for a 7-day course. At that time she stated she had used this medication before and had not had a reaction with it. She states that after 5 days of taking this medication she broke up in hives covering her chest, abdomen, legs, and arms. [facility], was advised to discontinue metronidazole and to go to the ED for further evaluation which she did. In the Ed she was found to have no airway compromise, was treated with prednisone, famotidine, and Benadryl, and given prescription for these medications for continue treatment at home.  During this visit, she states that her rash has much improved but is still present and itchy. She just started taking the prednisone taper. She again denies airway compromise including throat itching or dyspnea.   Her increased urinary frequency has resolved. She no longer has a vaginal discharge or itching.    Review of Systems  Constitutional: Negative for fever, chills, diaphoresis, activity change, appetite change and fatigue.  HENT: Negative for facial swelling, sore throat, trouble swallowing and voice change.   Eyes: Negative for itching.  Respiratory: Negative for cough, choking, shortness of breath and stridor.   Cardiovascular: Negative for chest pain, palpitations and leg swelling.  Gastrointestinal: Negative for abdominal pain and diarrhea.  Genitourinary: Negative for dysuria and vaginal discharge.  Skin: Positive for rash.  Allergic/Immunologic:       Drug allergic reaction   Neurological: Negative for dizziness, light-headedness and headaches.    Psychiatric/Behavioral: Negative for agitation.       Objective:   Physical Exam  Nursing note and vitals reviewed. Constitutional: She is oriented to person, place, and time. She appears well-developed and well-nourished. No distress.  Scratching her forearms at times.   Eyes: Conjunctivae are normal. No scleral icterus.  Cardiovascular: Normal rate and regular rhythm.   Pulmonary/Chest: Effort normal and breath sounds normal. No stridor. No respiratory distress. She has no wheezes. She has no rales.  Musculoskeletal: She exhibits no edema.  Neurological: She is alert and oriented to person, place, and time.  Skin: Skin is warm and dry. Rash noted. She is not diaphoretic. No erythema. No pallor.  Maculopapular rash that is scattered over her bilateral forearms, chest, abdomen, and bilateral legs.     Psychiatric: She has a normal mood and affect. Her behavior is normal.          Assessment & Plan:

## 2013-06-30 NOTE — Assessment & Plan Note (Signed)
-  She needs pneumovax but will defer until after her rash is cleared.

## 2013-06-30 NOTE — Assessment & Plan Note (Signed)
Resolved, she denies symptoms.

## 2013-07-01 NOTE — Progress Notes (Signed)
INTERNAL MEDICINE TEACHING ATTENDING ADDENDUM - Chanti Golubski, MD: I reviewed and discussed at the time of visit with the resident Dr. Kennerly, the patient's medical history, physical examination, diagnosis and results of tests and treatment and I agree with the patient's care as documented. 

## 2013-07-09 ENCOUNTER — Encounter: Payer: Self-pay | Admitting: Internal Medicine

## 2013-07-09 ENCOUNTER — Ambulatory Visit (INDEPENDENT_AMBULATORY_CARE_PROVIDER_SITE_OTHER): Payer: No Typology Code available for payment source | Admitting: Internal Medicine

## 2013-07-09 VITALS — BP 122/79 | HR 77 | Temp 97.2°F | Resp 20 | Ht 61.0 in | Wt 287.2 lb

## 2013-07-09 DIAGNOSIS — G44209 Tension-type headache, unspecified, not intractable: Secondary | ICD-10-CM

## 2013-07-09 DIAGNOSIS — L509 Urticaria, unspecified: Secondary | ICD-10-CM

## 2013-07-09 DIAGNOSIS — R519 Headache, unspecified: Secondary | ICD-10-CM

## 2013-07-09 DIAGNOSIS — R51 Headache: Secondary | ICD-10-CM

## 2013-07-09 LAB — BASIC METABOLIC PANEL WITH GFR
BUN: 10 mg/dL (ref 6–23)
CALCIUM: 9.1 mg/dL (ref 8.4–10.5)
CO2: 26 mEq/L (ref 19–32)
CREATININE: 1.08 mg/dL (ref 0.50–1.10)
Chloride: 105 mEq/L (ref 96–112)
GFR, Est African American: 84 mL/min
GFR, Est Non African American: 73 mL/min
Glucose, Bld: 91 mg/dL (ref 70–99)
Potassium: 4.3 mEq/L (ref 3.5–5.3)
Sodium: 138 mEq/L (ref 135–145)

## 2013-07-09 NOTE — Patient Instructions (Signed)
Keep working on the exercise and try to avoid the [employer]. Try to take a healthy snack to work instead of eating the food there. Try to stick with the grilled items if you are going to eat at work.   We''l check labs today to see how your kidney function is doing. If it looks ok, you can resume the ibuprofen.   I'll see you back in 6 months or sooner if you need me.

## 2013-07-09 NOTE — Progress Notes (Signed)
Patient ID: Alyssa Goodman, female   DOB: 12/17/1990, 23 y.o.   MRN: [id number]  Subjective:   Patient ID: Alyssa Goodman female   DOB: 09/07/1990 23 y.o.   MRN: [id number]  HPI: Ms.Alyssa Goodman is a 23 y.o. F w/ PMH morbid obesity and chronic asthma presents for a routine f/u after being treated for a drug reaction late last month.  She was given prednisone taper and benadryl PRN. She completed the course of therapy and has not had symptoms since.    Overall the patient states that she is doing well and has no complaints. She lost 3lbs since her last visit, but she is up 13 lbs from last year. She works at [employer] and eats her meals there. She did just join a gym and has been working out.    Past Medical History  Diagnosis Date  . Asthma    Current Outpatient Prescriptions  Medication Sig Dispense Refill  . albuterol (PROVENTIL HFA;VENTOLIN HFA) 108 (90 BASE) MCG/ACT inhaler Inhale 2 puffs into the lungs every 6 (six) hours as needed for wheezing.      . diphenhydrAMINE (BENADRYL) 25 MG tablet Take 2 tablets (50 mg total) by mouth every 4 (four) hours as needed for itching.  20 tablet  0  . famotidine (PEPCID) 20 MG tablet Take 1 tablet (20 mg total) by mouth 2 (two) times daily.  10 tablet  0  . HYDROcodone-acetaminophen (NORCO/VICODIN) 5-325 MG per tablet Take 1 tablet by mouth every 6 (six) hours as needed for moderate pain.      Marland Kitchen. ibuprofen (ADVIL,MOTRIN) 200 MG tablet Take 400-800 mg by mouth every 6 (six) hours as needed for headache or moderate pain.       . predniSONE (DELTASONE) 20 MG tablet 3 tabs po day one, then 2 tabs daily x 4 days  11 tablet  0   No current facility-administered medications for this visit.   No family history on file. History   Social History  . Marital Status: Single    Spouse Name: N/A    Number of Children: N/A  . Years of Education: 14   Occupational History  . UNEMPLOYED    Social History Main Topics  . Smoking status: Former Smoker   Types: Cigarettes    Quit date: 05/09/2013  . Smokeless tobacco: Never Used  . Alcohol Use: No  . Drug Use: No  . Sexual Activity: Not on file   Other Topics Concern  . Not on file   Social History Narrative  . No narrative on file   Review of Systems: Constitutional: Denies fever, chills, appetite change, or fatigue.  HEENT: Denies photophobia, eye pain. She does endorse headaches associated with stress/irritations Respiratory: Denies SOB, DOE, or wheezing.   Cardiovascular: Denies chest pain or leg swelling.  Gastrointestinal: Denies nausea, vomiting, abdominal pain, diarrhea, constipation Genitourinary: Denies dysuria, urgency, frequency Musculoskeletal: Denies myalgias, back pain  Neurological: Denies dizziness, weakness, light-headedness, numbness.  Psychiatric/Behavioral: Denies suicidal ideation, mood changes, confusion  Objective:  Physical Exam: Filed Vitals:   07/09/13 1007  BP: 122/79  Pulse: 77  Temp: 97.2 F (36.2 C)  TempSrc: Oral  Resp: 20  Height: 5\' 1"  (1.549 m)  Weight: 287 lb 3.2 oz (130.273 kg)  SpO2: 98%   Constitutional: Vital signs reviewed.  Patient is a morbidly obese female in no acute distress and cooperative with exam.  Head: Normocephalic and atraumatic Eyes: PERRL, EOMI, conjunctivae normal, No scleral icterus.  Neck:  Supple, Trachea midline Cardiovascular: RRR, no MRG, pulses symmetric and intact bilaterally Pulmonary/Chest: Normal respiratory effort, CTAB, no wheezes, rales, or rhonchi Abdominal: Soft. Non-tender, non-distended, bowel sounds are normal Musculoskeletal: No joint deformities, erythema, or stiffness, ROM full and no nontender Neurological: A&O x3, cranial nerve II-XII are grossly intact, no focal motor deficit Psychiatric: Normal mood and affect. speech and behavior is normal.   Assessment & Plan:   Please refer to Problem List based Assessment and Plan

## 2013-07-09 NOTE — Assessment & Plan Note (Signed)
Pt complete course of treatment. Urticaria/rash has resolved. No further symptoms.

## 2013-07-09 NOTE — Assessment & Plan Note (Signed)
Pt with headaches over left eye that occurs primarily at work and is related to stress and frustration. She states that Ibuprofen does help with her pain. Checking BMP to assess renal fx, last BMP in Aug with increased Cr above baseline.  - BMP today - Ibuprofen 400mg  po q6h pRN if renal function normal.

## 2013-07-09 NOTE — Assessment & Plan Note (Addendum)
Pt with BMI 54.27. Weight is up 13lbs from last year. She is working at [employer] and eats a fair amount of her meals there. She has recently joined a gym and is working out. We discussed dietary changes and trying to bring her meals to work, and if she does eat at [employer] restaurant, trying to eat the grilled chicken and salad.

## 2013-07-11 ENCOUNTER — Encounter: Payer: No Typology Code available for payment source | Admitting: Internal Medicine

## 2013-07-11 NOTE — Progress Notes (Signed)
Case discussed with Dr. Glenn soon after the resident saw the patient.  We reviewed the resident's history and exam and pertinent patient test results.  I agree with the assessment, diagnosis, and plan of care documented in the resident's note. 

## 2013-07-16 ENCOUNTER — Encounter: Payer: Self-pay | Admitting: Internal Medicine

## 2013-07-16 ENCOUNTER — Ambulatory Visit (INDEPENDENT_AMBULATORY_CARE_PROVIDER_SITE_OTHER): Payer: No Typology Code available for payment source | Admitting: Internal Medicine

## 2013-07-16 ENCOUNTER — Other Ambulatory Visit (HOSPITAL_COMMUNITY)
Admission: RE | Admit: 2013-07-16 | Discharge: 2013-07-16 | Disposition: A | Discharge status: Home / Self Care | Payer: No Typology Code available for payment source | Source: Ambulatory Visit | Attending: Internal Medicine | Admitting: Internal Medicine

## 2013-07-16 VITALS — BP 129/86 | HR 91 | Temp 98.3°F | Ht 61.0 in | Wt 290.6 lb

## 2013-07-16 DIAGNOSIS — A6000 Herpesviral infection of urogenital system, unspecified: Secondary | ICD-10-CM | POA: Insufficient documentation

## 2013-07-16 DIAGNOSIS — Z113 Encounter for screening for infections with a predominantly sexual mode of transmission: Secondary | ICD-10-CM | POA: Insufficient documentation

## 2013-07-16 DIAGNOSIS — N760 Acute vaginitis: Secondary | ICD-10-CM | POA: Insufficient documentation

## 2013-07-16 MED ORDER — HYDROCODONE-ACETAMINOPHEN 5-325 MG PO TABS: 1.0000 | ORAL_TABLET | Freq: Four times a day (QID) | ORAL | Status: DC | PRN

## 2013-07-16 NOTE — Progress Notes (Signed)
HPI The patient is a 23 y.o. female with a history of bacterial vaginosis, obesity, asthma, presenting for an acute visit for vaginal discharge.  The patient notes a 1-week history of moderate-large amounts of whitish-yellowish vaginal discharge, and vaginal itching.  She also notes a 1-week history of associated painful "bumps" on her external labia.  The patient has applied vaseline to the area.  The patient has had an allergic reaction to flagyl in the past, with symptoms of rash, with no airway swelling.  The patient is currently sexually active, using condoms every time, last active 1 week ago with a new partner.  The patient has no history of STD's.   ROS: General: no fevers, chills, changes in weight, changes in appetite Skin: no rash HEENT: no blurry vision, hearing changes, sore throat Pulm: no dyspnea, coughing, wheezing CV: no chest pain, palpitations, shortness of breath Abd: no abdominal pain, nausea/vomiting, diarrhea/constipation GU: see HPI Ext: no arthralgias, myalgias Neuro: no weakness, numbness, or tingling  Filed Vitals:   07/16/13 1611  BP: 129/86  Pulse: 91  Temp: 98.3 F (36.8 C)    PEX General: alert, cooperative, and in no apparent distress HEENT: pupils equal round and reactive to light, vision grossly intact, oropharynx clear and non-erythematous  Neck: supple Lungs: clear to ascultation bilaterally, normal work of respiration, no wheezes, rales, ronchi Heart: regular rate and rhythm, no murmurs, gallops, or rubs Abdomen: soft, non-tender, non-distended, normal bowel sounds Pelvic: External labia with few small papules, with no vesicles or discharge.  Vaginal vault with thick yellow-white discharge. Extremities: no cyanosis, clubbing, or edema Neurologic: alert & oriented X3, cranial nerves II-XII intact, strength grossly intact, sensation intact to light touch  Current Outpatient Prescriptions on File Prior to Visit  Medication Sig Dispense Refill  .  albuterol (PROVENTIL HFA;VENTOLIN HFA) 108 (90 BASE) MCG/ACT inhaler Inhale 2 puffs into the lungs every 6 (six) hours as needed for wheezing.      . diphenhydrAMINE (BENADRYL) 25 MG tablet Take 2 tablets (50 mg total) by mouth every 4 (four) hours as needed for itching.  20 tablet  0  . famotidine (PEPCID) 20 MG tablet Take 1 tablet (20 mg total) by mouth 2 (two) times daily.  10 tablet  0  . HYDROcodone-acetaminophen (NORCO/VICODIN) 5-325 MG per tablet Take 1 tablet by mouth every 6 (six) hours as needed for moderate pain.      Marland Kitchen. ibuprofen (ADVIL,MOTRIN) 200 MG tablet Take 400-800 mg by mouth every 6 (six) hours as needed for headache or moderate pain.       . predniSONE (DELTASONE) 20 MG tablet 3 tabs po day one, then 2 tabs daily x 4 days  11 tablet  0   No current facility-administered medications on file prior to visit.    Assessment/Plan

## 2013-07-16 NOTE — Assessment & Plan Note (Addendum)
The patient notes a 1-week history of vaginal itching and discharge, consistent with vaginitis. The patient has a history of recurrent BV.  The painful papules on the patient's external labia may represent a folliculitis, less likely HSV. -check wet prep -check urine GC/chlamydia -check HSV Ab -discussed vaginal hygiene, including avoiding washing the area with harsh soaps (which the patient had been doing) -pt has allergy to flagyl, can try topical clinda for BV if positive  Addendum 5/14: Wet prep, GC, and chlamydia returned negative.  HSV I&II IgM returned positive.  I've called the patient, and informed her to come back to clinic tomorrow to perform a viral PCR swab of one of these lesions, to confirm the diagnosis of genital herpes.  We will likely start empiric treatment with acyclovir at that time.

## 2013-07-16 NOTE — Patient Instructions (Signed)
General Instructions: For your vaginal discharge, we have sent labs to look for Bacterial Vaginosis and other conditions.  We will call you and prescribe treatment based on lab results.  The painful bumps may represent a folliculitis, due to irritation around the area.  We are also checking for genital herpes, though this is significantly less likely.  Please return for a follow-up visit in 2 months, or sooner if symptoms do not improve.   Treatment Goals:  Goals (1 Years of Data) as of 07/16/13   None      Progress Toward Treatment Goals:  No flowsheet data found.  Self Care Goals & Plans:  No flowsheet data found.  No flowsheet data found.   Care Management & Community Referrals:  Referral 07/09/2013  Referrals made to community resources weight management

## 2013-07-17 LAB — URINE CYTOLOGY ANCILLARY ONLY
CHLAMYDIA, DNA PROBE: NEGATIVE
Neisseria Gonorrhea: NEGATIVE

## 2013-07-17 LAB — CERVICOVAGINAL ANCILLARY ONLY
WET PREP (BD AFFIRM): NEGATIVE
Wet Prep (BD Affirm): NEGATIVE
Wet Prep (BD Affirm): NEGATIVE

## 2013-07-17 LAB — HSV(HERPES SIMPLEX VRS) I + II AB-IGM: Herpes Simplex Vrs I&II-IgM Ab (EIA): 6.6 INDEX — ABNORMAL HIGH

## 2013-07-19 ENCOUNTER — Encounter: Payer: Self-pay | Admitting: Internal Medicine

## 2013-07-19 ENCOUNTER — Ambulatory Visit (INDEPENDENT_AMBULATORY_CARE_PROVIDER_SITE_OTHER): Payer: No Typology Code available for payment source | Admitting: Internal Medicine

## 2013-07-19 VITALS — BP 123/79 | HR 86 | Temp 98.4°F | Ht 61.0 in | Wt 291.2 lb

## 2013-07-19 DIAGNOSIS — A6000 Herpesviral infection of urogenital system, unspecified: Secondary | ICD-10-CM

## 2013-07-19 DIAGNOSIS — N760 Acute vaginitis: Secondary | ICD-10-CM

## 2013-07-19 MED ORDER — ACYCLOVIR 400 MG PO TABS: 400.0000 mg | ORAL_TABLET | Freq: Three times a day (TID) | ORAL | Status: AC

## 2013-07-19 NOTE — Progress Notes (Signed)
Case discussed with Dr. Brown at the time of the visit.  We reviewed the resident's history and exam and pertinent patient test results.  I agree with the assessment, diagnosis, and plan of care documented in the resident's note. 

## 2013-07-19 NOTE — Patient Instructions (Addendum)
General Instructions: While we are waiting for the test results, we are starting you on a medication called Acyclovir.  Take 400 mg three times per day, for 10 days.  Once we know the results of your test, if the results are positive, we will need to discuss treatment strategies for the future.  Please review the materials I have provided for you, and we can have a discussion about this at your next visit, if your results are positive.  Please return for a follow-up visit in 2-3 weeks.   Progress Toward Treatment Goals:  No flowsheet data found.  Self Care Goals & Plans:  No flowsheet data found.  No flowsheet data found.   Care Management & Community Referrals:  Referral 07/09/2013  Referrals made to community resources weight management

## 2013-07-19 NOTE — Assessment & Plan Note (Addendum)
The patient returns for confirmatory testing for genital herpes, after HSV I&II IgM results were positive.  Pt notes no recent cold sores or other mouth lesions.  The vulvar lesions do not appear to have the typical appearance of HSV, but they are quite painful.  Differential includes folliculitis. -sending HSV PCR swab -starting acyclovir 400 mg TID x10 days for presumptive treatment of acute flare -will discuss episodic treatment vs daily prophylaxis at next visit -pt given information to review on genital herpes.  Questions were answered, and the disease course of this disease was discussed.  Pt was cautioned on the side effects of acyclovir, including, nausea, vomiting, diarrhea, malaise, and in very rare cases hepatitis, renal failure, and DIC.  Addendum 5/19: HSV pcr swab returned positive for HSV1, confirming the diagnosis of genital herpes.  The patient was called and informed of this result.  The patient noted that the painful lesions were becoming slightly less painful, but notes that she has not yet filled acyclovir due to low finances (though she admits she didn't go to the pharmacy to see how much the medication would cost).  The patient has a follow-up appointment scheduled for 5/29, at which time we can discuss long-term treatment strategies.  Given her history of medication non-compliance, episodic treatment may be more feasible than daily suppressive therapy.

## 2013-07-19 NOTE — Progress Notes (Signed)
HPI The patient is a 23 y.o. female with a history of asthma, obesity, recurrent BV, presenting for a follow-up visit for vaginal lesions.  The patient was seen in clinic 3 days ago with vaginal itching and discharge, thought to be due to BV, though wet prep was negative.  At that time, she also noted a first-time occurrence of painful labial bumps, and HSV I&II IgM Ab were found to be positive.  The patient presents today for repeat testing with HSV PCR swab of one of these lesions.  Since that time, the patient notes the areas are getting somewhat less painful, and she has been treating the area with vaseline.    ROS: General: no fevers, chills, changes in weight, changes in appetite Skin: see HPI HEENT: no blurry vision, hearing changes, sore throat Pulm: no dyspnea, coughing, wheezing CV: no chest pain, palpitations, shortness of breath Abd: no abdominal pain, nausea/vomiting, diarrhea/constipation GU: see HPI Ext: no arthralgias, myalgias Neuro: no weakness, numbness, or tingling  Filed Vitals:   07/19/13 1001  BP: 123/79  Pulse: 86  Temp: 98.4 F (36.9 C)    PEX General: alert, cooperative, appears stressed HEENT: pupils equal round and reactive to light, vision grossly intact, oropharynx clear and non-erythematous  Neck: supple Lungs: clear to ascultation bilaterally, normal work of respiration, no wheezes, rales, ronchi Heart: regular rate and rhythm, no murmurs, gallops, or rubs Abdomen: soft, non-tender, non-distended, normal bowel sounds Pelvic:  Extremities: no cyanosis, clubbing, or edema Neurologic: alert & oriented X3, cranial nerves II-XII intact, strength grossly intact, sensation intact to light touch  Current Outpatient Prescriptions on File Prior to Visit  Medication Sig Dispense Refill  . albuterol (PROVENTIL HFA;VENTOLIN HFA) 108 (90 BASE) MCG/ACT inhaler Inhale 2 puffs into the lungs every 6 (six) hours as needed for wheezing.      . diphenhydrAMINE  (BENADRYL) 25 MG tablet Take 2 tablets (50 mg total) by mouth every 4 (four) hours as needed for itching.  20 tablet  0  . famotidine (PEPCID) 20 MG tablet Take 1 tablet (20 mg total) by mouth 2 (two) times daily.  10 tablet  0  . HYDROcodone-acetaminophen (NORCO/VICODIN) 5-325 MG per tablet Take 1 tablet by mouth every 6 (six) hours as needed for moderate pain.  20 tablet  0  . ibuprofen (ADVIL,MOTRIN) 200 MG tablet Take 400-800 mg by mouth every 6 (six) hours as needed for headache or moderate pain.        No current facility-administered medications on file prior to visit.    Assessment/Plan

## 2013-07-20 NOTE — Progress Notes (Signed)
Case discussed with Dr. Brown at the time of the visit.  We reviewed the resident's history and exam and pertinent patient test results.  I agree with the assessment, diagnosis, and plan of care documented in the resident's note. 

## 2013-07-22 LAB — HSV PCR
HSV 2 BY PCR: NOT DETECTED
HSV BY PCR: DETECTED — AB

## 2013-08-02 ENCOUNTER — Ambulatory Visit (INDEPENDENT_AMBULATORY_CARE_PROVIDER_SITE_OTHER): Payer: No Typology Code available for payment source | Admitting: Internal Medicine

## 2013-08-02 ENCOUNTER — Encounter: Payer: Self-pay | Admitting: Internal Medicine

## 2013-08-02 VITALS — BP 120/83 | HR 87 | Temp 96.5°F | Ht 61.0 in | Wt 293.4 lb

## 2013-08-02 DIAGNOSIS — A6000 Herpesviral infection of urogenital system, unspecified: Secondary | ICD-10-CM

## 2013-08-02 LAB — HEPATIC FUNCTION PANEL
ALBUMIN: 3.9 g/dL (ref 3.5–5.2)
ALT: 17 U/L (ref 0–35)
AST: 19 U/L (ref 0–37)
Alkaline Phosphatase: 48 U/L (ref 39–117)
BILIRUBIN DIRECT: 0.1 mg/dL (ref 0.0–0.3)
BILIRUBIN TOTAL: 0.4 mg/dL (ref 0.2–1.2)
Indirect Bilirubin: 0.3 mg/dL (ref 0.2–1.2)
Total Protein: 6.8 g/dL (ref 6.0–8.3)

## 2013-08-02 LAB — CBC
HCT: 36.5 % (ref 36.0–46.0)
HEMOGLOBIN: 11.8 g/dL — AB (ref 12.0–15.0)
MCH: 26.3 pg (ref 26.0–34.0)
MCHC: 32.3 g/dL (ref 30.0–36.0)
MCV: 81.5 fL (ref 78.0–100.0)
PLATELETS: 331 10*3/uL (ref 150–400)
RBC: 4.48 MIL/uL (ref 3.87–5.11)
RDW: 15.2 % (ref 11.5–15.5)
WBC: 11.1 10*3/uL — ABNORMAL HIGH (ref 4.0–10.5)

## 2013-08-02 MED ORDER — ACYCLOVIR 400 MG PO TABS: 400.0000 mg | ORAL_TABLET | Freq: Two times a day (BID) | ORAL | Status: AC

## 2013-08-02 NOTE — Assessment & Plan Note (Signed)
The majority of today's visit was spent discussing options for treatment of genital herpes. We discussed episodic treatment versus daily suppressive treatment. We reviewed behavioral strategies for reducing transmission, including consistent condom use, and avoidance of sex during herpes flares. The patient reports that her greatest concern is spreading infection to a partner, so she prefers to try daily suppressive therapy. However, the patient was noncompliant with acyclovir during this first flare. We discussed the side effects of acyclovir, including nausea, vomiting, diarrhea, malaise, and less likely but serious risk of condition such as hepatitis, renal failure and DIC. -Start acyclovir 400 mg twice per day for daily suppressive therapy -Check CBC and LFTs (patient already had recent BMET).  Repeat every 6-12 months -Followup in 2-3 months to assess compliance

## 2013-08-02 NOTE — Progress Notes (Signed)
HPI The patient is a 23 y.o. female with a history of asthma, presenting for a follow-up visit for genital herpes  At the patient's last visit, swab of one of her genital lesions confirmed the diagnosis of genital herpes, with HSV 1. She presents today to discuss treatment options. The patient notes that her lesions completely resolved one week ago, though she never did fill her prescription for acyclovir. She states that she has reviewed the materials we gave her at her previous visit regarding genital herpes. Her biggest fear is spreading infection to a partner, although she notes that she has not been sexually active since our last visit and has no immediate plans.  ROS: General: no fevers, chills, changes in weight, changes in appetite Skin: no rash HEENT: no blurry vision, hearing changes, sore throat Pulm: no dyspnea, coughing, wheezing CV: no chest pain, palpitations, shortness of breath Abd: no abdominal pain, nausea/vomiting, diarrhea/constipation GU: see HPI Ext: no arthralgias, myalgias Neuro: no weakness, numbness, or tingling  Filed Vitals:   08/02/13 1516  BP: 120/83  Pulse: 87  Temp: 96.5 F (35.8 C)    PEX General: alert, cooperative, and in no apparent distress HEENT: pupils equal round and reactive to light, vision grossly intact, oropharynx clear and non-erythematous  Neck: supple Lungs: clear to ascultation bilaterally, normal work of respiration, no wheezes, rales, ronchi Heart: regular rate and rhythm, no murmurs, gallops, or rubs Abdomen: soft, non-tender, non-distended, normal bowel sounds Extremities: no cyanosis, clubbing, or edema Neurologic: alert & oriented X3, cranial nerves II-XII intact, strength grossly intact, sensation intact to light touch  Current Outpatient Prescriptions on File Prior to Visit  Medication Sig Dispense Refill  . albuterol (PROVENTIL HFA;VENTOLIN HFA) 108 (90 BASE) MCG/ACT inhaler Inhale 2 puffs into the lungs every 6 (six)  hours as needed for wheezing.      . diphenhydrAMINE (BENADRYL) 25 MG tablet Take 2 tablets (50 mg total) by mouth every 4 (four) hours as needed for itching.  20 tablet  0  . famotidine (PEPCID) 20 MG tablet Take 1 tablet (20 mg total) by mouth 2 (two) times daily.  10 tablet  0  . HYDROcodone-acetaminophen (NORCO/VICODIN) 5-325 MG per tablet Take 1 tablet by mouth every 6 (six) hours as needed for moderate pain.  20 tablet  0  . ibuprofen (ADVIL,MOTRIN) 200 MG tablet Take 400-800 mg by mouth every 6 (six) hours as needed for headache or moderate pain.        No current facility-administered medications on file prior to visit.    Assessment/Plan

## 2013-08-02 NOTE — Patient Instructions (Signed)
General Instructions: For your Genital Herpes, we have decided to start daily Acyclovir, to reduce your risk of transmission. -take Acyclovir, 400 mg, 1 tablet twice per day -we are checking some labs today, and will recheck them in 6-12 months  Please return for a follow-up visit in 2-3 months   Progress Toward Treatment Goals:  No flowsheet data found.  Self Care Goals & Plans:  No flowsheet data found.  No flowsheet data found.   Care Management & Community Referrals:  Referral 07/09/2013  Referrals made to community resources weight management

## 2013-08-02 NOTE — Progress Notes (Signed)
HPI The patient is a 23 y.o. female with a history of   ROS: General: no fevers, chills, changes in weight, changes in appetite Skin: no rash HEENT: no blurry vision, hearing changes, sore throat Pulm: no dyspnea, coughing, wheezing CV: no chest pain, palpitations, shortness of breath Abd: no abdominal pain, nausea/vomiting, diarrhea/constipation GU: no dysuria, hematuria, polyuria Ext: no arthralgias, myalgias Neuro: no weakness, numbness, or tingling  Filed Vitals:   08/02/13 1516  BP: 120/83  Pulse: 87  Temp: 96.5 F (35.8 C)    PEX General: alert, cooperative, and in no apparent distress HEENT: pupils equal round and reactive to light, vision grossly intact, oropharynx clear and non-erythematous  Neck: supple, no lymphadenopathy, JVD, or carotid bruits Lungs: clear to ascultation bilaterally, normal work of respiration, no wheezes, rales, ronchi Heart: regular rate and rhythm, no murmurs, gallops, or rubs Abdomen: soft, non-tender, non-distended, normal bowel sounds Extremities: 2+ DP/PT pulses bilaterally, no cyanosis, clubbing, or edema Neurologic: alert & oriented X3, cranial nerves II-XII intact, strength grossly intact, sensation intact to light touch  Current Outpatient Prescriptions on File Prior to Visit  Medication Sig Dispense Refill  . albuterol (PROVENTIL HFA;VENTOLIN HFA) 108 (90 BASE) MCG/ACT inhaler Inhale 2 puffs into the lungs every 6 (six) hours as needed for wheezing.      . diphenhydrAMINE (BENADRYL) 25 MG tablet Take 2 tablets (50 mg total) by mouth every 4 (four) hours as needed for itching.  20 tablet  0  . famotidine (PEPCID) 20 MG tablet Take 1 tablet (20 mg total) by mouth 2 (two) times daily.  10 tablet  0  . HYDROcodone-acetaminophen (NORCO/VICODIN) 5-325 MG per tablet Take 1 tablet by mouth every 6 (six) hours as needed for moderate pain.  20 tablet  0  . ibuprofen (ADVIL,MOTRIN) 200 MG tablet Take 400-800 mg by mouth every 6 (six) hours as  needed for headache or moderate pain.        No current facility-administered medications on file prior to visit.    Assessment/Plan

## 2013-08-06 NOTE — Progress Notes (Signed)
Case discussed with Dr. Brown soon after the resident saw the patient.  We reviewed the resident's history and exam and pertinent patient test results.  I agree with the assessment, diagnosis, and plan of care documented in the resident's note. 

## 2013-09-02 ENCOUNTER — Telehealth: Payer: Self-pay | Admitting: *Deleted

## 2013-09-02 NOTE — Telephone Encounter (Signed)
Can you call it into the pharmacy

## 2013-09-02 NOTE — Telephone Encounter (Signed)
Dr Manson PasseyBrown wrote a Rx on 5/29 for Acyclovir 400 take twice a day # 60 with 5 refills. This was printed out and pt called today stating she lost the Rx.  She would like this same Rx called into [facility]. [facility]. Will you refill for her?

## 2013-09-03 NOTE — Telephone Encounter (Signed)
I called the Rx Dr Manson PasseyBrown wrote on 5/29 into [facility].  Just wanted to be sure this was ok with you.

## 2013-09-03 NOTE — Telephone Encounter (Signed)
Absolutely. Thanks 

## 2013-10-10 ENCOUNTER — Encounter: Payer: No Typology Code available for payment source | Admitting: Internal Medicine

## 2013-10-16 ENCOUNTER — Ambulatory Visit: Payer: No Typology Code available for payment source

## 2013-10-17 ENCOUNTER — Ambulatory Visit (INDEPENDENT_AMBULATORY_CARE_PROVIDER_SITE_OTHER): Payer: Self-pay | Admitting: Internal Medicine

## 2013-10-17 ENCOUNTER — Encounter: Payer: Self-pay | Admitting: Internal Medicine

## 2013-10-17 VITALS — BP 112/72 | HR 76 | Temp 98.2°F | Wt 292.3 lb

## 2013-10-17 DIAGNOSIS — A6000 Herpesviral infection of urogenital system, unspecified: Secondary | ICD-10-CM

## 2013-10-17 NOTE — Patient Instructions (Signed)
Return to see me in 6 months or sooner if needed. Be sure to practice safe sex and use protection. If you are having an outbreak, you are contagious.   General Instructions:   Please bring your medicines with you each time you come to clinic.  Medicines may include prescription medications, over-the-counter medications, herbal remedies, eye drops, vitamins, or other pills.   Progress Toward Treatment Goals:  No flowsheet data found.  Self Care Goals & Plans:  No flowsheet data found.  No flowsheet data found.   Care Management & Community Referrals:  Referral 07/09/2013  Referrals made to community resources weight management

## 2013-10-17 NOTE — Assessment & Plan Note (Addendum)
Pt taking the acyclovir 400mg  BID for suppressive therapy since June, and since starting the medication has not had further outbreaks. She was advised that during an outbreak, she will be contagious, and that at this time, she should always use protection with intercourse.  - Continue acyclovir 400mg  BID.  - F/u in 67mo

## 2013-10-17 NOTE — Progress Notes (Signed)
Patient ID: Alyssa Goodman, female   DOB: [birth year], 23 y.o.   MRN: [id number]  Subjective:   Patient ID: Alyssa Goodman female   DOB: 08/31/1990 23 y.o.   MRN: [id number]  HPI: Ms.Alyssa Goodman is a 23 y.o. F w/ PMH morbid obesity, asthma, and genital herpes presents for a routine f/u.  She was seen by Dr. Manson Passey 08/02/13 and was prescribed acyclovir. However the pt lost the rx and was re-prescribed the medication in June. Since that time, she has been doing well and has had no further outbreaks. She denies any unprotected sex since before the outbreaks began in May. She states that a full pelvic exam and pap was performed at that time and was normal. She denies any vaginal discharge or foul odor.   Her weight has been stable since her last clinic visit. She states that she has not been exercising but does have a plant fitness membership and is planning to start using it soon. She was recently fired from her job at [employer], and since then her eating habits have improved somewhat.   She denies fevers, chills, chest pain, SOB, N/V, abd pain, diarrhea, constipation, urinary urgency/frequency, or pain with ambulation. +migraine headaches.   Past Medical History  Diagnosis Date  . Asthma    Current Outpatient Prescriptions  Medication Sig Dispense Refill  . albuterol (PROVENTIL HFA;VENTOLIN HFA) 108 (90 BASE) MCG/ACT inhaler Inhale 2 puffs into the lungs every 6 (six) hours as needed for wheezing.      . diphenhydrAMINE (BENADRYL) 25 MG tablet Take 2 tablets (50 mg total) by mouth every 4 (four) hours as needed for itching.  20 tablet  0  . famotidine (PEPCID) 20 MG tablet Take 1 tablet (20 mg total) by mouth 2 (two) times daily.  10 tablet  0  . ibuprofen (ADVIL,MOTRIN) 200 MG tablet Take 400-800 mg by mouth every 6 (six) hours as needed for headache or moderate pain.        No current facility-administered medications for this visit.   No family history on file. History   Social History  .  Marital Status: Single    Spouse Name: N/A    Number of Children: N/A  . Years of Education: 14   Occupational History  . UNEMPLOYED    Social History Main Topics  . Smoking status: Former Smoker    Types: Cigarettes    Quit date: 05/09/2013  . Smokeless tobacco: Never Used  . Alcohol Use: No  . Drug Use: No  . Sexual Activity: None   Other Topics Concern  . None   Social History Narrative  . None   Review of Systems: A 12 point ROS was performed; pertinent positives and negatives were noted in the HPI   Objective:  Physical Exam: Filed Vitals:   10/17/13 1509  BP: 112/72  Pulse: 76  Temp: 98.2 F (36.8 C)  TempSrc: Oral  Weight: 292 lb 4.8 oz (132.586 kg)  SpO2: 96%   Constitutional: Vital signs reviewed.  Patient is a very pleasant morbidly obese female in no acute distress and cooperative with exam. Alert and oriented x3.  Head: Normocephalic and atraumatic Eyes: PERRL, EOMI. No scleral icterus.  Neck: Supple, trachea midline, normal ROM, No JVD, mass, thyromegaly. Cardiovascular: RRR, no MRG, pulses symmetric and intact bilaterally Pulmonary/Chest: normal respiratory effort, CTAB, no wheezes, rales, or rhonchi Abdominal: Soft. Non-tender, non-distended, bowel sounds are normal, no masses, organomegaly, or guarding present.  Musculoskeletal: No joint deformities,  erythema, or stiffness, ROM full and no nontender Hematology: no cervical adenopathy.  Neurological: A&O x3, Strength is normal and symmetric bilaterally, cranial nerve II-XII are grossly intact, no focal motor deficit.  Skin: Warm, dry and intact.  Psychiatric: Normal mood and affect. speech and behavior is normal.   Assessment & Plan:   Please refer to Problem List based Assessment and Plan

## 2013-10-17 NOTE — Assessment & Plan Note (Signed)
Weight stable. Pt to start exercising at the gym, and now that she is not working at the [employer], I am hopeful that she will begin to eat a better diet. She has seen our nutritionist in the past but declines a referral to see her again at this time.  - Encouraged healthy eating and exercise - F/u in 6 mo to assess weight

## 2013-10-21 NOTE — Progress Notes (Signed)
Case discussed with Dr. Glenn soon after the resident saw the patient.  We reviewed the resident's history and exam and pertinent patient test results.  I agree with the assessment, diagnosis, and plan of care documented in the resident's note. 

## 2014-02-14 ENCOUNTER — Ambulatory Visit: Payer: Medicaid Other | Admitting: Internal Medicine

## 2014-02-25 ENCOUNTER — Ambulatory Visit: Payer: Medicaid Other

## 2014-02-26 ENCOUNTER — Ambulatory Visit: Payer: Medicaid Other

## 2014-03-12 ENCOUNTER — Ambulatory Visit: Payer: Medicaid Other

## 2014-03-19 ENCOUNTER — Emergency Department (HOSPITAL_COMMUNITY)
Admission: EM | Admit: 2014-03-19 | Discharge: 2014-03-19 | Disposition: A | Discharge status: Home / Self Care | Payer: Medicaid Other | Attending: Emergency Medicine | Admitting: Emergency Medicine

## 2014-03-19 ENCOUNTER — Encounter (HOSPITAL_COMMUNITY): Payer: Self-pay

## 2014-03-19 ENCOUNTER — Emergency Department (HOSPITAL_COMMUNITY): Payer: Medicaid Other

## 2014-03-19 DIAGNOSIS — Y9389 Activity, other specified: Secondary | ICD-10-CM | POA: Insufficient documentation

## 2014-03-19 DIAGNOSIS — X58XXXA Exposure to other specified factors, initial encounter: Secondary | ICD-10-CM | POA: Insufficient documentation

## 2014-03-19 DIAGNOSIS — W010XXA Fall on same level from slipping, tripping and stumbling without subsequent striking against object, initial encounter: Secondary | ICD-10-CM | POA: Insufficient documentation

## 2014-03-19 DIAGNOSIS — S93402A Sprain of unspecified ligament of left ankle, initial encounter: Secondary | ICD-10-CM | POA: Insufficient documentation

## 2014-03-19 DIAGNOSIS — Z87891 Personal history of nicotine dependence: Secondary | ICD-10-CM | POA: Insufficient documentation

## 2014-03-19 DIAGNOSIS — Z79899 Other long term (current) drug therapy: Secondary | ICD-10-CM | POA: Insufficient documentation

## 2014-03-19 DIAGNOSIS — Y990 Civilian activity done for income or pay: Secondary | ICD-10-CM | POA: Insufficient documentation

## 2014-03-19 DIAGNOSIS — Y9289 Other specified places as the place of occurrence of the external cause: Secondary | ICD-10-CM | POA: Insufficient documentation

## 2014-03-19 DIAGNOSIS — J45909 Unspecified asthma, uncomplicated: Secondary | ICD-10-CM | POA: Insufficient documentation

## 2014-03-19 MED ORDER — IBUPROFEN 800 MG PO TABS: 800.0000 mg | ORAL_TABLET | Freq: Three times a day (TID) | ORAL | Status: DC

## 2014-03-19 MED ORDER — IBUPROFEN 800 MG PO TABS
800.0000 mg | ORAL_TABLET | Freq: Once | ORAL | Status: AC
  Administered 2014-03-19: 800 mg via ORAL
  Filled 2014-03-19: qty 1

## 2014-03-19 NOTE — ED Notes (Signed)
Pt presents with c/o left ankle injury that occurred at work last night. Pt reports she tripped and fell and twisted her ankle, ambulatory to triage.

## 2014-03-19 NOTE — Discharge Instructions (Signed)

## 2014-03-19 NOTE — ED Provider Notes (Signed)
CSN: [id number]     Arrival date & time 03/19/14  1250 History  This chart was scribed for non-physician practitioner Jinny SandersJoseph Kestrel Mis, PA-C, working with Samuel JesterKathleen McManus, DO by Littie Deedsichard Sun, ED Scribe. This patient was seen in room WTR6/WTR6 and the patient's care was started at 2:03 PM.      Chief Complaint  Patient presents with  . Ankle Injury   The history is provided by the patient. No language interpreter was used.   HPI Comments: Alyssa Goodman is a 24 y.o. female who presents to the Emergency Department complaining of a left ankle injury that occurred right after leaving work last night, outside of her work site. Patient states that she tripped, fell and twisted her ankle. She reports having throbbing pain in her left ankle worsened when sitting down, standing up and ambulating.  [facility]  Past Medical History  Diagnosis Date  . Asthma    History reviewed. No pertinent past surgical history. No family history on file. History  Substance Use Topics  . Smoking status: Former Smoker    Types: Cigarettes    Quit date: 05/09/2013  . Smokeless tobacco: Never Used  . Alcohol Use: No   OB History    No data available     Review of Systems  Musculoskeletal: Positive for arthralgias.      Allergies  Shellfish allergy and Metronidazole  Home Medications   Prior to Admission medications   Medication Sig Start Date End Date Taking? Authorizing Provider  acyclovir (ZOVIRAX) 400 MG tablet Take 400 mg by mouth 2 (two) times daily.    Historical Provider, MD  albuterol (PROVENTIL HFA;VENTOLIN HFA) 108 (90 BASE) MCG/ACT inhaler Inhale 2 puffs into the lungs every 6 (six) hours as needed for wheezing.    Historical Provider, MD  diphenhydrAMINE (BENADRYL) 25 MG tablet Take 2 tablets (50 mg total) by mouth every 4 (four) hours as needed for itching. 06/27/13   Mora BellmanHannah S Merrell, PA-C  famotidine (PEPCID) 20 MG tablet Take 1 tablet (20 mg total) by mouth 2  (two) times daily. 06/27/13   Mora BellmanHannah S Merrell, PA-C  ibuprofen (ADVIL,MOTRIN) 800 MG tablet Take 1 tablet (800 mg total) by mouth 3 (three) times daily. 03/19/14   Monte FantasiaJoseph W Trevonte Ashkar, PA-C   BP 117/68 mmHg  Pulse 61  Temp(Src) 97.7 F (36.5 C) (Oral)  Resp 18  SpO2 97%  LMP 03/12/2013 (Exact Date) Physical Exam  Constitutional: She is oriented to person, place, and time. She appears well-developed and well-nourished. No distress.  HENT:  Head: Normocephalic and atraumatic.  Mouth/Throat: Oropharynx is clear and moist. No oropharyngeal exudate.  Eyes: Pupils are equal, round, and reactive to light.  Neck: Neck supple.  Cardiovascular: Normal rate.   Pulses:      Dorsalis pedis pulses are 2+ on the left side.  Capillary refill < 2 seconds.  Pulmonary/Chest: Effort normal.  Musculoskeletal: She exhibits tenderness. She exhibits no edema.  Mild tenderness on the lateral portion of the proximal metatarsals. Passive and active ROM of ankle is normal. Patient has 5 out of 5 motor strength at knee, ankle. DP pulse 2+. Distal sensation intact. Capillary refill less than 2 seconds.  Neurological: She is alert and oriented to person, place, and time. No cranial nerve deficit.  Distal sensation intact.  Skin: Skin is warm and dry. No rash noted.  Psychiatric: She has a normal mood and affect. Her behavior is normal.  Nursing note and vitals reviewed.  ED Course  Procedures  DIAGNOSTIC STUDIES: Oxygen Saturation is 97% on room air, normal by my interpretation.    COORDINATION OF CARE: 2:08 PM-Discussed treatment plan which includes RICE protocol, referral to orthopedics and ankle brace with pt at bedside and pt agreed to plan.    Labs Review Labs Reviewed - No data to display  Imaging Review Dg Ankle Complete Left  03/19/2014   CLINICAL DATA:  Left ankle injury at work last night, twisted ankle, generalized ankle pain  EXAM: LEFT ANKLE COMPLETE - 3+ VIEW  COMPARISON:  None.  FINDINGS:  Three views of the left ankle submitted. No acute fracture or subluxation. There is small plantar spur of calcaneus. Ankle mortise is preserved.  IMPRESSION: Negative.   Electronically Signed   By: Natasha Mead M.D.   On: 03/19/2014 13:59     EKG Interpretation None      MDM   Final diagnoses:  Ankle sprain, left, initial encounter    Patient here with left ankle pain status post fall last night. Patient states she is able to bear weight on her ankle, however it is painful. Patient neurovascularly intact. No obvious injury noted on examination, radiographs unremarkable for acute pathology. We'll place patient in ASO brace, given crutches. I encouraged RICE therapy and use of NSAIDs for pain. I discussed return precautions with patient, and encourage her to follow-up with orthopedics and her primary care physician. Patient verbalizes understanding and agreement of this plan. I encouraged patient to call or return to the ER should she have any questions or concerns.  I personally performed the services described in this documentation, which was scribed in my presence. The recorded information has been reviewed and is accurate.  BP 117/68 mmHg  Pulse 61  Temp(Src) 97.7 F (36.5 C) (Oral)  Resp 18  SpO2 97%  LMP 03/12/2013 (Exact Date)  Signed,  Ladona Mow, PA-C 9:21 PM   Monte Fantasia, PA-C 03/19/14 2122  Samuel Jester, DO 03/22/14 [phone]

## 2014-03-20 ENCOUNTER — Encounter: Payer: Self-pay | Admitting: Internal Medicine

## 2014-03-20 ENCOUNTER — Ambulatory Visit (INDEPENDENT_AMBULATORY_CARE_PROVIDER_SITE_OTHER): Payer: Self-pay | Admitting: Internal Medicine

## 2014-03-20 VITALS — BP 135/73 | HR 78 | Temp 98.4°F | Wt 290.5 lb

## 2014-03-20 DIAGNOSIS — Z23 Encounter for immunization: Secondary | ICD-10-CM

## 2014-03-20 DIAGNOSIS — Z0000 Encounter for general adult medical examination without abnormal findings: Secondary | ICD-10-CM

## 2014-03-20 DIAGNOSIS — W19XXXD Unspecified fall, subsequent encounter: Secondary | ICD-10-CM

## 2014-03-20 DIAGNOSIS — S93402D Sprain of unspecified ligament of left ankle, subsequent encounter: Secondary | ICD-10-CM

## 2014-03-20 DIAGNOSIS — S93409A Sprain of unspecified ligament of unspecified ankle, initial encounter: Secondary | ICD-10-CM | POA: Insufficient documentation

## 2014-03-20 DIAGNOSIS — A6000 Herpesviral infection of urogenital system, unspecified: Secondary | ICD-10-CM

## 2014-03-20 MED ORDER — HYDROCODONE-ACETAMINOPHEN 5-325 MG PO TABS: 1.0000 | ORAL_TABLET | Freq: Four times a day (QID) | ORAL | Status: DC | PRN

## 2014-03-20 MED ORDER — ACYCLOVIR 400 MG PO TABS: 400.0000 mg | ORAL_TABLET | Freq: Two times a day (BID) | ORAL | Status: DC

## 2014-03-20 NOTE — Assessment & Plan Note (Signed)
Pt given ibuprofen, but states that the ibuprofen is not helping her pain. She is able to ambulate on her foot but is not using her crutches.  - Recommended icing the foot, keeping it elevated, adn using the crutches for the next week or so as needed. - Will give her a prescription for Vicodin 5-325mg  1 tablet q6h PRN pain, disp #15, 0 refills.

## 2014-03-20 NOTE — Assessment & Plan Note (Signed)
Her weight is stable at 290. She si not exercising but does have a gym membership. She just graduated from college, so she now has more time to exercise. She is no longer working at a [employer], which should help with her diet. We have tried referring her to [facility] in the past, but she no showed the last few appointments - F/u in 1 month

## 2014-03-20 NOTE — Assessment & Plan Note (Signed)
Pt off acyclovir for 2 months and states she is having mild genital herpes outbreaks.  - Restarting acyclovir 400mg  BID

## 2014-03-20 NOTE — Assessment & Plan Note (Signed)
Flu vaccine today 

## 2014-03-20 NOTE — Patient Instructions (Signed)
Work on exercising 5 days a week, and try to cut back on your calorie consumption. There are a number of apps that you can try to help with calorie counting and recording your exercise.   Return to the clinic in 1 month for a check up.  General Instructions:   Please bring your medicines with you each time you come to clinic.  Medicines may include prescription medications, over-the-counter medications, herbal remedies, eye drops, vitamins, or other pills.   Progress Toward Treatment Goals:  No flowsheet data found.  Self Care Goals & Plans:  No flowsheet data found.  No flowsheet data found.   Care Management & Community Referrals:  Referral 07/09/2013  Referrals made to community resources weight management

## 2014-03-20 NOTE — Progress Notes (Signed)
Patient ID: Alyssa Goodman, female   DOB: 03/14/1990, 24 y.o.   MRN: [id number]  Subjective:   Patient ID: Alyssa Goodman female   DOB: [birth year] 23 y.o.   MRN: [id number]  HPI: Alyssa Goodman is a 24 y.o. F w/ PMH morbid obesity, asthma, and genital herpes presents for a routine f/u.  She is not taking the acyclovir daily and needs a refill; she has been out for 2 months. She endorses "mild" genital herpes outbreaks since being off the Acyclovir.    She was seen in the ED 03/19/13 for ankle injury, after twisting her ankle leaving work on the evening of 1/12. Xray without acute fracture, and she was asked to f/u with Ortho as an outpatient. She is having pain in her ankle but states that the swelling has improved and she denies any ecchymoses.   She is due for her flu vaccine.    Past Medical History  Diagnosis Date  . Asthma    Current Outpatient Prescriptions  Medication Sig Dispense Refill  . ibuprofen (ADVIL,MOTRIN) 800 MG tablet Take 1 tablet (800 mg total) by mouth 3 (three) times daily. 21 tablet 0  . acyclovir (ZOVIRAX) 400 MG tablet Take 1 tablet (400 mg total) by mouth 2 (two) times daily. 60 tablet 6  . albuterol (PROVENTIL HFA;VENTOLIN HFA) 108 (90 BASE) MCG/ACT inhaler Inhale 2 puffs into the lungs every 6 (six) hours as needed for wheezing.    . diphenhydrAMINE (BENADRYL) 25 MG tablet Take 2 tablets (50 mg total) by mouth every 4 (four) hours as needed for itching. (Patient not taking: Reported on 03/20/2014) 20 tablet 0  . famotidine (PEPCID) 20 MG tablet Take 1 tablet (20 mg total) by mouth 2 (two) times daily. (Patient not taking: Reported on 03/20/2014) 10 tablet 0  . HYDROcodone-acetaminophen (NORCO/VICODIN) 5-325 MG per tablet Take 1 tablet by mouth every 6 (six) hours as needed for moderate pain. 15 tablet 0   No current facility-administered medications for this visit.   No family history on file. History   Social History  . Marital Status: Single    Spouse  Name: N/A    Number of Children: N/A  . Years of Education: 14   Occupational History  . UNEMPLOYED    Social History Main Topics  . Smoking status: Former Smoker    Types: Cigarettes    Quit date: 05/09/2013  . Smokeless tobacco: Never Used  . Alcohol Use: No  . Drug Use: No  . Sexual Activity: None   Other Topics Concern  . None   Social History Narrative   Review of Systems: A 12 point ROS was performed; pertinent positives and negatives are noted below.  +occasional SOB+back pain, and left ankle pain. + occasional headaches.    Objective:  Physical Exam: Filed Vitals:   03/20/14 1440 03/20/14 1512  BP: 143/73 135/73  Pulse: 73 78  Temp: 98.4 F (36.9 C)   TempSrc: Oral   Weight: 290 lb 8 oz (131.77 kg)   SpO2: 100%    Constitutional: Vital signs reviewed.  Patient is a morbidly obese female in no acute distress and cooperative with exam. Alert and oriented x3.  Head: Normocephalic and atraumatic Eyes: PERRL, EOMI. No scleral icterus.  Cardiovascular: RRR, no MRG, pulses symmetric and intact bilaterally Pulmonary/Chest: Normal respiratory effort, CTAB, no wheezes, rales, or rhonchi Abdominal: Obese. Soft. Non-tender, bowel sounds are normal, no masses, organomegaly, or guarding appreciated.  Musculoskeletal: No joint deformities Neurological: A&O x3,  Strength is normal and symmetric bilaterally, cranial nerve II-XII are grossly intact, no focal motor deficits.  Skin: Warm, dry and intact.   Psychiatric: Normal mood and affect. Speech and behavior is normal.   Assessment & Plan:   Please refer to Problem List based Assessment and Plan

## 2014-03-21 NOTE — Progress Notes (Signed)
INTERNAL MEDICINE TEACHING ATTENDING ADDENDUM - Mikela Senn, MD: I reviewed and discussed at the time of visit with the resident Dr. Glenn, the patient's medical history, physical examination, diagnosis and results of pertinent tests and treatment and I agree with the patient's care as documented.  

## 2014-04-03 ENCOUNTER — Encounter: Payer: Medicaid Other | Admitting: Internal Medicine

## 2014-05-06 ENCOUNTER — Other Ambulatory Visit (HOSPITAL_COMMUNITY)
Admission: RE | Admit: 2014-05-06 | Discharge: 2014-05-06 | Disposition: A | Discharge status: Home / Self Care | Payer: Medicaid Other | Source: Ambulatory Visit | Attending: Internal Medicine | Admitting: Internal Medicine

## 2014-05-06 ENCOUNTER — Ambulatory Visit (INDEPENDENT_AMBULATORY_CARE_PROVIDER_SITE_OTHER): Payer: Self-pay | Admitting: Internal Medicine

## 2014-05-06 ENCOUNTER — Encounter: Payer: Self-pay | Admitting: Internal Medicine

## 2014-05-06 ENCOUNTER — Ambulatory Visit: Payer: Medicaid Other | Admitting: Internal Medicine

## 2014-05-06 VITALS — BP 126/76 | HR 75 | Temp 97.7°F | Ht 61.0 in | Wt 295.0 lb

## 2014-05-06 DIAGNOSIS — Z113 Encounter for screening for infections with a predominantly sexual mode of transmission: Secondary | ICD-10-CM | POA: Insufficient documentation

## 2014-05-06 DIAGNOSIS — N9489 Other specified conditions associated with female genital organs and menstrual cycle: Secondary | ICD-10-CM

## 2014-05-06 DIAGNOSIS — N760 Acute vaginitis: Secondary | ICD-10-CM | POA: Diagnosis present

## 2014-05-06 DIAGNOSIS — Z01419 Encounter for gynecological examination (general) (routine) without abnormal findings: Secondary | ICD-10-CM | POA: Insufficient documentation

## 2014-05-06 DIAGNOSIS — N898 Other specified noninflammatory disorders of vagina: Secondary | ICD-10-CM

## 2014-05-06 LAB — POCT URINE PREGNANCY: Preg Test, Ur: NEGATIVE

## 2014-05-06 NOTE — Assessment & Plan Note (Addendum)
Pt with vaginal odor for the past few weeks that initially presented with discharge that has resolved. She endorses using condoms and denies any unprotected intercourse. She states that her partner is not c/o penile discharge or pain. She states that she is taking her Valacyclovir for her genital herpes and denies a current outbreak. She has a h/o BV and trichomonas and feels that this is similar to her previous infections. Last pap smear was May 2014 with ASCUS. She has had repeat pelvic exams since that time but no pap was performed. HIV negative in March 2015. On exam, mildly friable cervical tissue with stringy yellow mucusy discharge.  - Checking pap smear - Checking GC, chlamydia, candida, trichomonas, and BV. - Checking urine pregnancy - Checking HIV  Addendum: 05/08/14 HIV negative Pap negative for intraepithelial lesions of malignancy GC, chlamydia, and trichomonas negative Candida negative Bacterial vaginosis POSITIVE --> pt allergic to flagyl, so treating with Clindamycin 300mg  BID x7 days

## 2014-05-06 NOTE — Progress Notes (Signed)
Internal Medicine Clinic Attending  Case discussed with Dr. Glenn at the time of the visit.  We reviewed the resident's history and exam and pertinent patient test results.  I agree with the assessment, diagnosis, and plan of care documented in the resident's note.  

## 2014-05-06 NOTE — Patient Instructions (Addendum)
We will check labs today, and I will call you with the results in a few days. If you don't hear from me by Friday, please call the clinic.   Be sure to use protection when you have intercourse.    General Instructions:   Please bring your medicines with you each time you come to clinic.  Medicines may include prescription medications, over-the-counter medications, herbal remedies, eye drops, vitamins, or other pills.   Progress Toward Treatment Goals:  No flowsheet data found.  Self Care Goals & Plans:  No flowsheet data found.  No flowsheet data found.   Care Management & Community Referrals:  Referral 07/09/2013  Referrals made to community resources weight management

## 2014-05-06 NOTE — Progress Notes (Signed)
Patient ID: Alyssa Goodman, female   DOB: [birth year], 24 y.o.   MRN: [id number]  Subjective:   Patient ID: Alyssa Goodman female   DOB: [birth year] 23 y.o.   MRN: [id number]  HPI: Ms.Alyssa Goodman is a 24 y.o. F w/ PMH morbid obesity, asthma, and genital herpes presents for an acute visit.  Today she c/o vaginal odor after having unprotected intercourse that has been occuring for a few weeks. She denies any current discharge or pain or burning. She did have discharge when the odor began. She is using condoms with intercourse everytime. She is requesting a pregnancy test. Her LMP was 05/01/14 but was lighter than normal. She endorses nausea as well. Last sexual intercourse was 2 weeks ago.   She denies fevers, chills, chest pain, SOB, abd pain, N/V, pain or difficulty with urination or intercourse.    Past Medical History  Diagnosis Date  . Asthma    Current Outpatient Prescriptions  Medication Sig Dispense Refill  . acyclovir (ZOVIRAX) 400 MG tablet Take 1 tablet (400 mg total) by mouth 2 (two) times daily. 60 tablet 6  . albuterol (PROVENTIL HFA;VENTOLIN HFA) 108 (90 BASE) MCG/ACT inhaler Inhale 2 puffs into the lungs every 6 (six) hours as needed for wheezing.    . diphenhydrAMINE (BENADRYL) 25 MG tablet Take 2 tablets (50 mg total) by mouth every 4 (four) hours as needed for itching. (Patient not taking: Reported on 03/20/2014) 20 tablet 0  . famotidine (PEPCID) 20 MG tablet Take 1 tablet (20 mg total) by mouth 2 (two) times daily. (Patient not taking: Reported on 03/20/2014) 10 tablet 0  . HYDROcodone-acetaminophen (NORCO/VICODIN) 5-325 MG per tablet Take 1 tablet by mouth every 6 (six) hours as needed for moderate pain. 15 tablet 0  . ibuprofen (ADVIL,MOTRIN) 800 MG tablet Take 1 tablet (800 mg total) by mouth 3 (three) times daily. 21 tablet 0   No current facility-administered medications for this visit.   No family history on file. History   Social History  . Marital Status: Single    Spouse Name: N/A  . Number of Children: N/A  . Years of Education: 14   Occupational History  . UNEMPLOYED    Social History Main Topics  . Smoking status: Former Smoker    Types: Cigarettes    Quit date: 05/09/2013  . Smokeless tobacco: Never Used  . Alcohol Use: No  . Drug Use: No  . Sexual Activity: Not on file   Other Topics Concern  . None   Social History Narrative   Review of Systems: A 12 point ROS was performed; pertinent positives and negatives were noted in the HPI   Objective:  Physical Exam: Filed Vitals:   05/06/14 1535  BP: 126/76  Pulse: 75  Temp: 97.7 F (36.5 C)  TempSrc: Oral  Height:  (1.549 m)  Weight: 295 lb (133.811 kg)  SpO2: 99%   Constitutional: Vital signs reviewed.  Patient is a well-developed and well-nourished female in no acute distress and cooperative with exam. Alert and oriented x3.  Head: Normocephalic and atraumatic Eyes: PERRL, EOMI Neck: Trachea midline, normal ROM Cardiovascular: RRR, no MRG Pulmonary/Chest: Normal respiratory effort, CTAB, no wheezes, rales, or rhonchi Abdominal: Soft. Non-tender, non-distended, bowel sounds are normal, no masses, organomegaly, or guarding present.  GU: No CVA tenderness. No cervical or adnexal tenderness. Mildly friable cervical tissue after speculum insertion. Yellow mucus present, unable to clear completely from cervix. Cervix visualized but not well due to mucus.  Musculoskeletal: No joint deformities or stiffness, ROM full Neurological: A&O x3, cranial nerve II-XII are grossly intact, no focal motor deficit.  Skin: Warm, dry and intact. No rash.  Psychiatric: Normal mood and affect. Speech and behavior is normal.   Assessment & Plan:   Please refer to Problem List based Assessment and Plan

## 2014-05-07 LAB — HIV ANTIBODY (ROUTINE TESTING W REFLEX): HIV 1&2 Ab, 4th Generation: NONREACTIVE

## 2014-05-07 LAB — CERVICOVAGINAL ANCILLARY ONLY
Chlamydia: NEGATIVE
Neisseria Gonorrhea: NEGATIVE

## 2014-05-07 LAB — CYTOLOGY - PAP

## 2014-05-08 LAB — CERVICOVAGINAL ANCILLARY ONLY
WET PREP (BD AFFIRM): NEGATIVE
Wet Prep (BD Affirm): NEGATIVE
Wet Prep (BD Affirm): POSITIVE — AB

## 2014-05-08 MED ORDER — CLINDAMYCIN HCL 300 MG PO CAPS: 300.0000 mg | ORAL_CAPSULE | Freq: Two times a day (BID) | ORAL | Status: DC

## 2014-05-08 NOTE — Addendum Note (Signed)
Addended by: Genelle GatherGLENN, Mathilde Mcwherter F on: 05/08/2014 09:26 AM   Modules accepted: Orders

## 2014-05-09 ENCOUNTER — Telehealth: Payer: Self-pay | Admitting: *Deleted

## 2014-05-09 NOTE — Telephone Encounter (Signed)
Pt called - wants Rx for clindamycin caps faxed in to [facility]. Rx was faxed in. Info on clindamycin cap mailed to pt. Stanton KidneyDebra Jakorian Marengo RN 05/09/14 2:40PM

## 2014-07-22 ENCOUNTER — Encounter: Payer: Self-pay | Admitting: *Deleted

## 2014-10-08 ENCOUNTER — Ambulatory Visit (INDEPENDENT_AMBULATORY_CARE_PROVIDER_SITE_OTHER): Payer: Self-pay | Admitting: Internal Medicine

## 2014-10-08 ENCOUNTER — Encounter: Payer: Self-pay | Admitting: Internal Medicine

## 2014-10-08 ENCOUNTER — Other Ambulatory Visit (HOSPITAL_COMMUNITY)
Admission: RE | Admit: 2014-10-08 | Discharge: 2014-10-08 | Disposition: A | Discharge status: Home / Self Care | Payer: Medicaid Other | Source: Ambulatory Visit | Attending: Internal Medicine | Admitting: Internal Medicine

## 2014-10-08 DIAGNOSIS — N898 Other specified noninflammatory disorders of vagina: Secondary | ICD-10-CM

## 2014-10-08 DIAGNOSIS — N760 Acute vaginitis: Secondary | ICD-10-CM | POA: Insufficient documentation

## 2014-10-08 DIAGNOSIS — A6000 Herpesviral infection of urogenital system, unspecified: Secondary | ICD-10-CM

## 2014-10-08 DIAGNOSIS — Z113 Encounter for screening for infections with a predominantly sexual mode of transmission: Secondary | ICD-10-CM | POA: Insufficient documentation

## 2014-10-08 LAB — POCT URINALYSIS DIPSTICK
Bilirubin, UA: NEGATIVE
Glucose, UA: NEGATIVE
KETONES UA: NEGATIVE
Nitrite, UA: NEGATIVE
Spec Grav, UA: >=1.03
UROBILINOGEN UA: 0.2
pH, UA: 5.5

## 2014-10-08 MED ORDER — ACYCLOVIR 400 MG PO TABS: 400.0000 mg | ORAL_TABLET | Freq: Three times a day (TID) | ORAL | Status: DC

## 2014-10-08 NOTE — Progress Notes (Signed)
Patient ID: Alyssa Goodman, female   DOB: [birth year], 24 y.o. MRN: [id number]      Subjective:   Patient ID: Alyssa Goodman female    DOB: 1990/11/11 24 y.o.    MRN: [id number] Health Maintenance Due: Health Maintenance Due  Topic Date Due  . INFLUENZA VACCINE  10/06/2014    _________________________________________________  HPI: Alyssa Goodman is a 24 y.o. female here for an acute visit.  Pt has a PMH outlined below.  Please see problem-based charting assessment and plan note for further details of medical issues addressed at today's visit.  PMH: Past Medical History  Diagnosis Date  . Asthma     Medications: Current Outpatient Prescriptions on File Prior to Visit  Medication Sig Dispense Refill  . albuterol (PROVENTIL HFA;VENTOLIN HFA) 108 (90 BASE) MCG/ACT inhaler Inhale 2 puffs into the lungs every 6 (six) hours as needed for wheezing.     No current facility-administered medications on file prior to visit.    Allergies: Allergies  Allergen Reactions  . Shellfish Allergy     Mostly oysters and limited to number of shrimp.  . Metronidazole Rash    FH: No family history on file.  SH: History   Social History  . Marital Status: Single    Spouse Name: N/A  . Number of Children: N/A  . Years of Education: 14   Occupational History  . UNEMPLOYED    Social History Main Topics  . Smoking status: Former Smoker    Types: Cigarettes    Quit date: 05/09/2013  . Smokeless tobacco: Never Used  . Alcohol Use: No  . Drug Use: No  . Sexual Activity: Not on file   Other Topics Concern  . None   Social History Narrative    Review of Systems: Constitutional: Negative for fever, chills and weight loss.  Eyes: Negative for blurred vision.  Respiratory: Negative for cough and shortness of breath.  Cardiovascular: Negative for chest pain, palpitations and leg swelling.  Gastrointestinal: Negative for nausea, vomiting, abdominal pain, diarrhea, constipation and  blood in stool.  Genitourinary: +dysuria, urgency and frequency.  Musculoskeletal: Negative for myalgias and back pain.  Neurological: Negative for dizziness, weakness and headaches.     Objective:   Vital Signs: Filed Vitals:   10/08/14 1439  BP: 116/69  Pulse: 66  Temp: 98.2 F (36.8 C)  TempSrc: Oral  Height:  (1.549 m)  Weight: 290 lb 9.6 oz (131.815 kg)  SpO2: 100%      BP Readings from Last 3 Encounters:  10/08/14 116/69  05/06/14 126/76  03/20/14 135/73    Physical Exam: Constitutional: Vital signs reviewed.  Patient is in NAD and cooperative with exam.  Head: Normocephalic and atraumatic. Eyes: EOMI, conjunctivae nl, no scleral icterus.  Neck: Supple. Cardiovascular: RRR, no MRG. Pulmonary/Chest: normal effort, CTAB, no wheezes, rales, or rhonchi. Abdominal: Soft. NT/ND +BS. Neurological: A&O x3, cranial nerves II-XII are grossly intact, moving all extremities. Genital: No external lesions, vaginal vault contained a creamy white discharge without any erythema or lesions.  Normal appearing cervix.  Extremities: 2+DP b/l; no pitting edema. Skin: Warm, dry and intact. No rash.   Assessment & Plan:   Assessment and plan was discussed and formulated with my attending.

## 2014-10-08 NOTE — Patient Instructions (Signed)
Thank you for your visit today.   Please return to the internal medicine clinic in as needed.   I will refill your acylovir.  Please be sure to bring all of your medications with you to every visit; this includes herbal supplements, vitamins, eye drops, and any over-the-counter medications.   Should you have any questions regarding your medications and/or any new or worsening symptoms, please be sure to call the clinic at [phone].   If you believe that you are suffering from a life threatening condition or one that may result in the loss of limb or function, then you should call 911 or proceed to the nearest Emergency Department.   A healthy lifestyle and preventative care can promote health and wellness.   Maintain regular health, dental, and eye exams.  Eat a healthy diet. Foods like vegetables, fruits, whole grains, low-fat dairy products, and lean protein foods contain the nutrients you need without too many calories. Decrease your intake of foods high in solid fats, added sugars, and salt. Get information about a proper diet from your caregiver, if necessary.  Regular physical exercise is one of the most important things you can do for your health. Most adults should get at least 150 minutes of moderate-intensity exercise (any activity that increases your heart rate and causes you to sweat) each week. In addition, most adults need muscle-strengthening exercises on 2 or more days a week.   Maintain a healthy weight. The body mass index (BMI) is a screening tool to identify possible weight problems. It provides an estimate of body fat based on height and weight. Your caregiver can help determine your BMI, and can help you achieve or maintain a healthy weight. For adults 20 years and older:  A BMI below 18.5 is considered underweight.  A BMI of 18.5 to 24.9 is normal.  A BMI of 25 to 29.9 is considered overweight.  A BMI of 30 and above is considered obese. Safe Sex Safe sex  is about reducing the risk of giving or getting a sexually transmitted disease (STD). STDs are spread through sexual contact involving the genitals, mouth, or rectum. Some STDs can be cured and others cannot. Safe sex can also prevent unintended pregnancies.  WHAT ARE SOME SAFE SEX PRACTICES?  Limit your sexual activity to only one partner who is having sex with only you.  Talk to your partner about his or her past partners, past STDs, and drug use.  Use a condom every time you have sexual intercourse. This includes vaginal, oral, and anal sexual activity. Both females and males should wear condoms during oral sex. Only use latex or polyurethane condoms and water-based lubricants. Using petroleum-based lubricants or oils to lubricate a condom will weaken the condom and increase the chance that it will break. The condom should be in place from the beginning to the end of sexual activity. Wearing a condom reduces, but does not completely eliminate, your risk of getting or giving an STD. STDs can be spread by contact with infected body fluids and skin.  Get vaccinated for hepatitis B and HPV.  Avoid alcohol and recreational drugs, which can affect your judgment. You may forget to use a condom or participate in high-risk sex.  For females, avoid douching after sexual intercourse. Douching can spread an infection farther into the reproductive tract.  Check your body for signs of sores, blisters, rashes, or unusual discharge. See your health care provider if you notice any of these signs.  Avoid sexual contact  if you have symptoms of an infection or are being treated for an STD. If you or your partner has herpes, avoid sexual contact when blisters are present. Use condoms at all other times.  If you are at risk of being infected with HIV, it is recommended that you take a prescription medicine daily to prevent HIV infection. This is called pre-exposure prophylaxis (PrEP). You are considered at risk  if:  You are a man who has sex with other men (MSM).  You are a heterosexual man or woman who is sexually active with more than one partner.  You take drugs by injection.  You are sexually active with a partner who has HIV.  Talk with your health care provider about whether you are at high risk of being infected with HIV. If you choose to begin PrEP, you should first be tested for HIV. You should then be tested every 3 months for as long as you are taking PrEP.  See your health care provider for regular screenings, exams, and tests for other STDs. Before having sex with a new partner, each of you should be screened for STDs and should talk about the results with each other. WHAT ARE THE BENEFITS OF SAFE SEX?   There is less chance of getting or giving an STD.  You can prevent unwanted or unintended pregnancies.  By discussing safe sex concerns with your partner, you may increase feelings of intimacy, comfort, trust, and honesty between the two of you. Document Released: 03/31/2004 Document Revised: 07/08/2013 Document Reviewed: 08/15/2011 [facility] Patient Information 2015 [place], [place]. This information is not intended to replace advice given to you by your health care provider. Make sure you discuss any questions you have with your health care provider.

## 2014-10-09 LAB — URINALYSIS, ROUTINE W REFLEX MICROSCOPIC
BILIRUBIN UA: NEGATIVE
Glucose, UA: NEGATIVE
Ketones, UA: NEGATIVE
Nitrite, UA: NEGATIVE
PROTEIN UA: NEGATIVE
Specific Gravity, UA: 1.025 (ref 1.005–1.030)
Urobilinogen, Ur: 0.2 mg/dL (ref 0.2–1.0)
pH, UA: 5.5 (ref 5.0–7.5)

## 2014-10-09 LAB — MICROSCOPIC EXAMINATION
CASTS: NONE SEEN /LPF
WBC, UA: >30 /hpf — AB (ref 0–?)

## 2014-10-09 LAB — URINE CYTOLOGY ANCILLARY ONLY
CHLAMYDIA, DNA PROBE: NEGATIVE
Neisseria Gonorrhea: NEGATIVE

## 2014-10-09 NOTE — Assessment & Plan Note (Signed)
-  refilled acyclovir to be taken PRN at first sign of outbreak

## 2014-10-09 NOTE — Assessment & Plan Note (Addendum)
Pt reports a strong odor and vaginal discharge for the past 3-4 weeks.  Describes the discharge as whitish without pruritis.  Endorses some burning with urination.  No rashes, fever/chills, N/V.  Does reports being sexually active with 2 people but consistently uses condoms.  Does not use protection for oral sex.  She has a h/o genital herpes and was taking acyclovir for prophylaxis but she ran out recently.  Also with h/o trichomonas (and BV).  Had normal pap smear in March and LMP was 09/29/2014.  No h/o DM.  HIV previously tested neg.  Exam unremarkable except for a slight creamy white discharge in the vaginal vault.   -wet prep and urine studies  -advised importance of using protection for oral sex as well  -f/u PRN -advised I would call her with any abnormal tests

## 2014-10-10 ENCOUNTER — Telehealth: Payer: Self-pay | Admitting: Internal Medicine

## 2014-10-10 LAB — CERVICOVAGINAL ANCILLARY ONLY: Wet Prep (BD Affirm): POSITIVE — AB

## 2014-10-10 NOTE — Telephone Encounter (Signed)
Pt seen in clinic on 8/3 for vaginal discharge.  She is calling for test results. Please call pt at # [phone]

## 2014-10-10 NOTE — Telephone Encounter (Signed)
Patient requesting test results

## 2014-10-13 ENCOUNTER — Other Ambulatory Visit: Payer: Self-pay | Admitting: Internal Medicine

## 2014-10-13 DIAGNOSIS — N760 Acute vaginitis: Secondary | ICD-10-CM

## 2014-10-13 MED ORDER — CLINDAMYCIN HCL 150 MG PO CAPS: 300.0000 mg | ORAL_CAPSULE | Freq: Two times a day (BID) | ORAL | Status: DC

## 2014-10-13 NOTE — Progress Notes (Signed)
Called Alyssa Goodman and made her aware of the BV diagnosis.  She requested pills instead of cream so I sent in (faxed) for clindamycin x 7 days to the [facility].  Advised her of the side effects to be aware of.

## 2014-10-15 NOTE — Progress Notes (Signed)
Internal Medicine Clinic Attending  Case discussed with Dr. Gill soon after the resident saw the patient.  We reviewed the resident's history and exam and pertinent patient test results.  I agree with the assessment, diagnosis, and plan of care documented in the resident's note.  

## 2014-10-17 NOTE — Telephone Encounter (Signed)
Dr Delane Ginger has called pt and treated

## 2014-11-12 ENCOUNTER — Ambulatory Visit (INDEPENDENT_AMBULATORY_CARE_PROVIDER_SITE_OTHER): Payer: Self-pay | Admitting: *Deleted

## 2014-11-12 DIAGNOSIS — Z0000 Encounter for general adult medical examination without abnormal findings: Secondary | ICD-10-CM | POA: Insufficient documentation

## 2014-11-12 DIAGNOSIS — Z111 Encounter for screening for respiratory tuberculosis: Secondary | ICD-10-CM

## 2014-11-14 LAB — TB SKIN TEST: TB Skin Test: NEGATIVE

## 2015-02-18 ENCOUNTER — Encounter: Payer: Self-pay | Admitting: Internal Medicine

## 2015-02-18 ENCOUNTER — Other Ambulatory Visit (HOSPITAL_COMMUNITY)
Admission: RE | Admit: 2015-02-18 | Discharge: 2015-02-18 | Disposition: A | Discharge status: Home / Self Care | Payer: Medicaid Other | Source: Ambulatory Visit | Attending: Internal Medicine | Admitting: Internal Medicine

## 2015-02-18 ENCOUNTER — Ambulatory Visit (INDEPENDENT_AMBULATORY_CARE_PROVIDER_SITE_OTHER): Payer: Self-pay | Admitting: Internal Medicine

## 2015-02-18 VITALS — BP 131/83 | HR 90 | Temp 98.1°F | Wt 299.1 lb

## 2015-02-18 DIAGNOSIS — Z23 Encounter for immunization: Secondary | ICD-10-CM

## 2015-02-18 DIAGNOSIS — Z113 Encounter for screening for infections with a predominantly sexual mode of transmission: Secondary | ICD-10-CM | POA: Insufficient documentation

## 2015-02-18 DIAGNOSIS — N760 Acute vaginitis: Secondary | ICD-10-CM | POA: Insufficient documentation

## 2015-02-18 DIAGNOSIS — N898 Other specified noninflammatory disorders of vagina: Secondary | ICD-10-CM

## 2015-02-18 DIAGNOSIS — Z0000 Encounter for general adult medical examination without abnormal findings: Secondary | ICD-10-CM

## 2015-02-18 LAB — POCT URINALYSIS DIPSTICK
BILIRUBIN UA: NEGATIVE
GLUCOSE UA: NEGATIVE
Ketones, UA: NEGATIVE
Nitrite, UA: NEGATIVE
Urobilinogen, UA: 0.2
pH, UA: 5.5

## 2015-02-18 MED ORDER — CLINDAMYCIN PHOSPHATE 2 % VA CREA: 1.0000 | TOPICAL_CREAM | Freq: Every day | VAGINAL | Status: DC

## 2015-02-18 NOTE — Progress Notes (Signed)
Patient ID: Alyssa Goodman, female   DOB: 05/26/1990, 24 y.o.   MRN: [id number] [facility] Subjective:   Patient ID: Alyssa ArbourBianca L Goodman female   DOB: 06/01/1990 24 y.o.   MRN: [id number]  HPI: Alyssa Goodman is a 24 y.o. female with  A history of bacterial vaginosis and herpes labialis presenting with foul-smelling vaginal discharge and hematuria. She was treated for bacterial vaginosis back in August with clindamycin, as metronidazole causes her to break out in rash.  Yesterday, she noticed similar symptoms to her bacterial vaginosis: a white, creamy foul-smelling discharge , and blood in her urine. She denies any dysuria, urgency, or frequency.  She also denies any flank pain or fevers. She has been having protected sex with the same partner as back in the summer.  Past Medical History  Diagnosis Date  . Asthma   . Foul smelling vaginal discharge 06/27/2012    BV, treated 05/08/14 Trichomonas and BV, most recently treated on 06/28/12. Trichomonas treated on 04/16/13.    Current Outpatient Prescriptions  Medication Sig Dispense Refill  . acyclovir (ZOVIRAX) 400 MG tablet Take 1 tablet (400 mg total) by mouth 3 (three) times daily. for 5 days at the earliest signs of an outbreak. 15 tablet 3  . albuterol (PROVENTIL HFA;VENTOLIN HFA) 108 (90 BASE) MCG/ACT inhaler Inhale 2 puffs into the lungs every 6 (six) hours as needed for wheezing.    . clindamycin (CLEOCIN) 150 MG capsule Take 2 capsules (300 mg total) by mouth 2 (two) times daily. 28 capsule 0   No current facility-administered medications for this visit.   No family history on file. Social History   Social History  . Marital Status: Single    Spouse Name: N/A  . Number of Children: N/A  . Years of Education: 14   Occupational History  . UNEMPLOYED    Social History Main Topics  . Smoking status: Former Smoker    Types: Cigarettes    Quit date: 05/09/2013  . Smokeless tobacco: Never Used  . Alcohol Use: No   . Drug Use: No  . Sexual Activity: Not Asked   Other Topics Concern  . None   Social History Narrative   Review of Systems  Constitutional: Negative for fever and chills.  Gastrointestinal: Negative for abdominal pain and diarrhea.  Genitourinary: Positive for hematuria. Negative for dysuria, urgency, frequency and flank pain.  Musculoskeletal: Negative for myalgias.  Skin: Negative for itching and rash.  Psychiatric/Behavioral: Negative for depression.   Objective:  Physical Exam: Filed Vitals:   02/18/15 1109  BP: 131/83  Pulse: 90  Temp: 98.1 F (36.7 C)  TempSrc: Oral  Weight: 299 lb 1.6 oz (135.671 kg)  SpO2: 99%   General: resting in chair comfortably, appropriately conversational HEENT: no scleral icterus, extra-ocular muscles intact, oropharynx without lesions Cardiac: regular rate and rhythm, no rubs, murmurs or gallops Pulm: breathing well, clear to auscultation bilaterally Abd: bowel sounds normal, soft, nondistended, non-tender Pelvic:  No external lesions noted. There was some white, foul-smelling discharge in the vaginal vault. Ext: warm and well perfused, without pedal edema Lymph: no cervical or supraclavicular lymphadenopathy Skin: no rash, hair, or nail changes Neuro: alert and oriented X3, cranial nerves II-XII grossly intact, moving all extremities well  Assessment & Plan:  Case discussed with Dr. Levora DredgeButcher  Foul smelling vaginal discharge Her symptoms of white, foul-smelling vaginal discharge sounds most consistent with bacterial vaginosis.  This was confirmed by pelvic exam; I sent off for  gonococcus and Chlamydia and a wet prep as well. I'll treat her with topical clindamycin this time, so that she can treat herself in the future.  Instead of having to continually come in for oral clindamycin. Unfortunately, she had an angioedema reaction to oral metronidazole in the past so I decided against using topical metronidazole. I'll call her back if she has  another concomitant infection, but I doubt this.  Health care maintenance  She is up-to-date on her Pap smear, and got her flu shot today.   Medications Ordered Meds ordered this encounter  Medications  . clindamycin (CLEOCIN) 2 % vaginal cream    Sig: Place 1 Applicatorful vaginally at bedtime. Until your symptoms resolve    Dispense:  40 g    Refill:  0   Other Orders Orders Placed This Encounter  Procedures  . Flu Vaccine QUAD 36+ mos IM   Follow Up: Return if symptoms worsen or fail to improve.

## 2015-02-18 NOTE — Progress Notes (Signed)
Internal Medicine Clinic Attending  Case discussed with Dr. Flores at the time of the visit.  We reviewed the resident's history and exam and pertinent patient test results.  I agree with the assessment, diagnosis, and plan of care documented in the resident's note. 

## 2015-02-18 NOTE — Addendum Note (Signed)
Addended by: Maura CrandallGOLDSTON, DARLENE C on: 02/18/2015 04:48 PM   Modules accepted: Orders

## 2015-02-18 NOTE — Assessment & Plan Note (Signed)
She is up-to-date on her Pap smear, and got her flu shot today.

## 2015-02-18 NOTE — Assessment & Plan Note (Signed)
Her symptoms of white, foul-smelling vaginal discharge sounds most consistent with bacterial vaginosis.  This was confirmed by pelvic exam; I sent off for gonococcus and Chlamydia and a wet prep as well. I'll treat her with topical clindamycin this time, so that she can treat herself in the future.  Instead of having to continually come in for oral clindamycin. Unfortunately, she had an angioedema reaction to oral metronidazole in the past so I decided against using topical metronidazole. I'll call her back if she has another concomitant infection, but I doubt this.

## 2015-02-18 NOTE — Patient Instructions (Signed)
Alyssa Goodman,  It was nice to meet you today. Like we talked about, I think that your vaginal discharge is likely bacterial vaginosis again. I prescribed you topical clindamycin ,) also instructions for you to use. I'll call you if there is another infection going on, we may need to send you another antibiotic.  One thing to note is that the topical clindamycin can  Make the bugs harder to kill over time. Thus, only use the topical clindamycin when you're having symptoms, and stopped using it when the discharge gets better.  Take care, and have a most merry Christmas, Dr. Earnest ConroyFlores

## 2015-02-19 ENCOUNTER — Other Ambulatory Visit: Payer: Self-pay | Admitting: Internal Medicine

## 2015-02-19 DIAGNOSIS — A749 Chlamydial infection, unspecified: Secondary | ICD-10-CM | POA: Insufficient documentation

## 2015-02-19 LAB — CERVICOVAGINAL ANCILLARY ONLY
CHLAMYDIA, DNA PROBE: POSITIVE — AB
Neisseria Gonorrhea: NEGATIVE
WET PREP (BD AFFIRM): POSITIVE — AB

## 2015-02-20 ENCOUNTER — Other Ambulatory Visit: Payer: Self-pay | Admitting: Internal Medicine

## 2015-02-20 DIAGNOSIS — A749 Chlamydial infection, unspecified: Secondary | ICD-10-CM

## 2015-02-20 DIAGNOSIS — B9689 Other specified bacterial agents as the cause of diseases classified elsewhere: Secondary | ICD-10-CM | POA: Insufficient documentation

## 2015-02-20 DIAGNOSIS — A599 Trichomoniasis, unspecified: Secondary | ICD-10-CM | POA: Insufficient documentation

## 2015-02-20 DIAGNOSIS — N760 Acute vaginitis: Secondary | ICD-10-CM

## 2015-02-20 MED ORDER — CLINDAMYCIN HCL 300 MG PO CAPS: 300.0000 mg | ORAL_CAPSULE | Freq: Two times a day (BID) | ORAL | Status: AC

## 2015-02-20 MED ORDER — AZITHROMYCIN 500 MG PO TABS: 1000.0000 mg | ORAL_TABLET | Freq: Every day | ORAL | Status: AC

## 2015-02-20 NOTE — Progress Notes (Signed)
I called Alyssa Goodman to notify her that the results from her pelvic exam showed she is now infected with chlamydia and trichomonas.  I sent her azithromycin 1000 mg for a one time dose, and clindamycin 300 mg twice daily for 7 days to empirically treat bacterial vaginosis.  Treating her trichomonas will be much more difficult, as metronidazole is the only active antibiotic, and she has had a legitimate angioedema-reaction to metronidazole in the past. I recommended trying treatment with azithromycin and clindamycin first; if her vaginal discharge persists after a week, I asked her to call back so we can consider metronidazole desensitization. I believe we would have to do this inpatient, with help from pharmacy. I also recommended she have her partners be evaluated and treated for STIs.

## 2015-07-01 ENCOUNTER — Emergency Department (HOSPITAL_COMMUNITY)
Admission: EM | Admit: 2015-07-01 | Discharge: 2015-07-01 | Disposition: A | Discharge status: Home / Self Care | Payer: Medicaid Other | Attending: Emergency Medicine | Admitting: Emergency Medicine

## 2015-07-01 ENCOUNTER — Encounter (HOSPITAL_COMMUNITY): Payer: Self-pay | Admitting: Emergency Medicine

## 2015-07-01 DIAGNOSIS — Z87891 Personal history of nicotine dependence: Secondary | ICD-10-CM | POA: Insufficient documentation

## 2015-07-01 DIAGNOSIS — Z79899 Other long term (current) drug therapy: Secondary | ICD-10-CM | POA: Insufficient documentation

## 2015-07-01 DIAGNOSIS — J45909 Unspecified asthma, uncomplicated: Secondary | ICD-10-CM | POA: Insufficient documentation

## 2015-07-01 DIAGNOSIS — Z3202 Encounter for pregnancy test, result negative: Secondary | ICD-10-CM | POA: Insufficient documentation

## 2015-07-01 DIAGNOSIS — E669 Obesity, unspecified: Secondary | ICD-10-CM | POA: Insufficient documentation

## 2015-07-01 DIAGNOSIS — Z8742 Personal history of other diseases of the female genital tract: Secondary | ICD-10-CM | POA: Insufficient documentation

## 2015-07-01 DIAGNOSIS — M62838 Other muscle spasm: Secondary | ICD-10-CM | POA: Insufficient documentation

## 2015-07-01 HISTORY — DX: Obesity, unspecified: E66.9

## 2015-07-01 LAB — BASIC METABOLIC PANEL
ANION GAP: 11 (ref 5–15)
BUN: 9 mg/dL (ref 6–20)
CHLORIDE: 103 mmol/L (ref 101–111)
CO2: 24 mmol/L (ref 22–32)
Calcium: 8.8 mg/dL — ABNORMAL LOW (ref 8.9–10.3)
Creatinine, Ser: 0.97 mg/dL (ref 0.44–1.00)
Glucose, Bld: 90 mg/dL (ref 65–99)
POTASSIUM: 3.8 mmol/L (ref 3.5–5.1)
SODIUM: 138 mmol/L (ref 135–145)

## 2015-07-01 LAB — POC URINE PREG, ED: PREG TEST UR: NEGATIVE

## 2015-07-01 MED ORDER — CYCLOBENZAPRINE HCL 10 MG PO TABS: 10.0000 mg | ORAL_TABLET | Freq: Two times a day (BID) | ORAL | Status: DC | PRN

## 2015-07-01 MED ORDER — TRAMADOL HCL 50 MG PO TABS
50.0000 mg | ORAL_TABLET | Freq: Once | ORAL | Status: AC
  Administered 2015-07-01: 50 mg via ORAL
  Filled 2015-07-01: qty 1

## 2015-07-01 MED ORDER — LORAZEPAM 1 MG PO TABS: 1.0000 mg | ORAL_TABLET | Freq: Two times a day (BID) | ORAL | Status: DC | PRN

## 2015-07-01 MED ORDER — CLONAZEPAM 0.5 MG PO TABS: 0.5000 mg | ORAL_TABLET | Freq: Two times a day (BID) | ORAL | Status: DC | PRN

## 2015-07-01 MED ORDER — LORAZEPAM 0.5 MG PO TABS
1.0000 mg | ORAL_TABLET | Freq: Once | ORAL | Status: AC
  Administered 2015-07-01: 1 mg via ORAL
  Filled 2015-07-01: qty 2

## 2015-07-01 MED ORDER — TRAMADOL HCL 50 MG PO TABS: 50.0000 mg | ORAL_TABLET | Freq: Four times a day (QID) | ORAL | Status: DC | PRN

## 2015-07-01 NOTE — Discharge Instructions (Signed)
Muscle Cramps and Spasms Muscle cramps and spasms occur when a muscle or muscles tighten and you have no control over this tightening (involuntary muscle contraction). They are a common problem and can develop in any muscle. The most common place is in the calf muscles of the leg. Both muscle cramps and muscle spasms are involuntary muscle contractions, but they also have differences:   Muscle cramps are sporadic and painful. They may last a few seconds to a quarter of an hour. Muscle cramps are often more forceful and last longer than muscle spasms.  Muscle spasms may or may not be painful. They may also last just a few seconds or much longer. CAUSES  It is uncommon for cramps or spasms to be due to a serious underlying problem. In many cases, the cause of cramps or spasms is unknown. Some common causes are:   Overexertion.   Overuse from repetitive motions (doing the same thing over and over).   Remaining in a certain position for a long period of time.   Improper preparation, form, or technique while performing a sport or activity.   Dehydration.   Injury.   Side effects of some medicines.   Abnormally low levels of the salts and ions in your blood (electrolytes), especially potassium and calcium. This could happen if you are taking water pills (diuretics) or you are pregnant.  Some underlying medical problems can make it more likely to develop cramps or spasms. These include, but are not limited to:   Diabetes.   Parkinson disease.   Hormone disorders, such as thyroid problems.   Alcohol abuse.   Diseases specific to muscles, joints, and bones.   Blood vessel disease where not enough blood is getting to the muscles.  HOME CARE INSTRUCTIONS   Stay well hydrated. Drink enough water and fluids to keep your urine clear or pale yellow.  It may be helpful to massage, stretch, and relax the affected muscle.  For tight or tense muscles, use a warm towel, heating  pad, or hot shower water directed to the affected area.  If you are sore or have pain after a cramp or spasm, applying ice to the affected area may relieve discomfort.  Put ice in a plastic bag.  Place a towel between your skin and the bag.  Leave the ice on for 15-20 minutes, 03-04 times a day.  Medicines used to treat a known cause of cramps or spasms may help reduce their frequency or severity. Only take over-the-counter or prescription medicines as directed by your caregiver. SEEK MEDICAL CARE IF:  Your cramps or spasms get more severe, more frequent, or do not improve over time.  MAKE SURE YOU:   Understand these instructions.  Will watch your condition.  Will get help right away if you are not doing well or get worse.   This information is not intended to replace advice given to you by your health care provider. Make sure you discuss any questions you have with your health care provider.   Document Released: 08/13/2001 Document Revised: 06/18/2012 Document Reviewed: 02/08/2012 Elsevier Interactive Patient Education 2016 [employer]  Myoclonus Myoclonus is a term that refers to brief, involuntary twitching or jerking of a muscle or a group of muscles. It describes a symptom, and generally, is not a diagnosis of a disease. The myoclonic twitches or jerks are usually caused by sudden muscle contractions. They also can result from brief lapses of contraction. Myoclonic twitches or jerks may occur:  Alone or  in sequence.  In a pattern or without pattern.  Infrequently or many times each minute. Often times, myoclonus is one of several symptoms in a wide variety of nervous system disorders such as:  Multiple sclerosis.  Parkinson's disease.  Alzheimer's disease.  Creutzfeldt-Jakob disease. Familiar examples of normal myoclonus include:  Hiccups and jerks.  "Sleep starts" that some people have while drifting off to sleep. Severe cases can severely limit a person's  ability to:  Eat.  Talk.  Walk. Myoclonic jerks commonly occur in individuals with epilepsy. The most common types of myoclonus include:  Action.  Cortical reflex.  Essential.  Palatal.  Progressive myoclonus epilepsy.  Reticular reflex.  Sleep.  Stimulus-sensitive. TREATMENT  Treatment for myoclonus consists of medicines that may help reduce symptoms. These drugs (many of which are also used to treat epilepsy) include:   Barbiturates.  Clonazepam.  Phenytoin.  Primidone.  Sodium valproate. The complex origins of myoclonus may require the use of multiple drugs.   This information is not intended to replace advice given to you by your health care provider. Make sure you discuss any questions you have with your health care provider.   Document Released: 02/11/2002 Document Revised: 05/16/2011 Document Reviewed: 01/24/2013 Elsevier Interactive Patient Education [employer]

## 2015-07-01 NOTE — ED Provider Notes (Signed)
CSN: [id number]     Arrival date & time 07/01/15  1910 History   By signing my name below, I, Evon Slackerrance Branch, attest that this documentation has been prepared under the direction and in the presence of Danelle BerryLeisa Emmi Wertheim, PA-C. Electronically Signed: Evon Slackerrance Branch, ED Scribe. 07/01/2015. 8:17 PM.    Chief Complaint  Patient presents with  . Shaking    The history is provided by the patient. No language interpreter was used.   HPI Comments: Alyssa Goodman is a 25 y.o. female who presents to the Emergency Department complaining of worsening twitching in her bilateral arms and fingers onset 2 years prior. She states that the twitching has recently worsened over the last 2 weeks. Pt states that that the left side is worse than the right side. Pt also reports that she has sporadic twitching in her face as well. Pt states that during the twitching episodes the pain radiates up her arm. Pt states that the movements are involuntary. She cannot identify any aggravating or alleviating factors. She denies any changes to medication, diet and lifestyle or environmental exposures. She denies any illness or associated symptoms. She states that she currently does not have a PCP and within several years of having the symptoms she has only been seen once at an urgent care, but she states they did not help her.  She explains that the intermittent muscle spasms and twitches were "not that bad" for the past several years, so that's why she has never seen a doctor.  She states she is currently having twitches and both of her hands and occasionally in her face and her lips. She currently denies any other symptoms including fever, chills, sweats, headache, visual changes, syncope, vertigo, weakness, numbness, tingling. She is with family members at the bedside. The patient and her family members state that she has never had any seizure-like activity, has never lost consciousness, been confused or altered. She denies any other  medical problems, unexpected weigh loss/ weight gain, LOC, seizures, no previous head injuries. Denies chance of pregnancy. LMP 06/16/2015  Past Medical History  Diagnosis Date  . Asthma   . Foul smelling vaginal discharge 06/27/2012    BV, treated 05/08/14 Trichomonas and BV, most recently treated on 06/28/12. Trichomonas treated on 04/16/13.   . Obesity    History reviewed. No pertinent past surgical history. No family history on file. Social History  Substance Use Topics  . Smoking status: Former Smoker    Types: Cigarettes    Quit date: 05/09/2013  . Smokeless tobacco: Never Used  . Alcohol Use: No   OB History    No data available      Review of Systems  All other systems reviewed and are negative.    Allergies  Shellfish allergy and Metronidazole  Home Medications   Prior to Admission medications   Medication Sig Start Date End Date Taking? Authorizing Provider  acyclovir (ZOVIRAX) 400 MG tablet Take 1 tablet (400 mg total) by mouth 3 (three) times daily. for 5 days at the earliest signs of an outbreak. 10/08/14   Marrian SalvageJacquelyn S Gill, MD  albuterol (PROVENTIL HFA;VENTOLIN HFA) 108 (90 BASE) MCG/ACT inhaler Inhale 2 puffs into the lungs every 6 (six) hours as needed for wheezing.    Historical Provider, MD  clonazePAM (KLONOPIN) 0.5 MG tablet Take 1 tablet (0.5 mg total) by mouth 2 (two) times daily as needed (for motor tics/involuntary muscle spasms). 07/01/15   Danelle BerryLeisa Keondrick Dilks, PA-C  cyclobenzaprine (FLEXERIL) 10 MG tablet  Take 1 tablet (10 mg total) by mouth 2 (two) times daily as needed for muscle spasms. 07/01/15   Danelle Berry, PA-C  traMADol (ULTRAM) 50 MG tablet Take 1 tablet (50 mg total) by mouth every 6 (six) hours as needed. 07/01/15   Danelle Berry, PA-C   BP 122/62 mmHg  Pulse 88  Temp(Src) 98.4 F (36.9 C) (Oral)  Resp 20  Ht  (1.549 m)  Wt 138.12 kg  BMI 57.56 kg/m2  SpO2 100%  LMP 06/17/2015 (Approximate)    Physical Exam  Constitutional: She is oriented  to person, place, and time. Vital signs are normal. She appears well-developed and well-nourished. She is cooperative.  Non-toxic appearance. She does not have a sickly appearance. No distress.  Well-appearing young obese female, no acute distress  HENT:  Head: Normocephalic and atraumatic.  Right Ear: External ear normal.  Left Ear: External ear normal.  Nose: Nose normal.  Mouth/Throat: Uvula is midline, oropharynx is clear and moist and mucous membranes are normal. Mucous membranes are not pale, not dry and not cyanotic. No trismus in the jaw. No oropharyngeal exudate, posterior oropharyngeal edema or posterior oropharyngeal erythema.  Eyes: Conjunctivae, EOM and lids are normal. Pupils are equal, round, and reactive to light. Right eye exhibits no discharge. Left eye exhibits no discharge.  Neck: Trachea normal, normal range of motion and phonation normal. Neck supple. No spinous process tenderness and no muscular tenderness present. No rigidity. No tracheal deviation, no edema, no erythema and normal range of motion present.  Cardiovascular: Normal rate, regular rhythm, normal heart sounds and normal pulses.   Pulses:      Radial pulses are 2+ on the right side, and 2+ on the left side.       Dorsalis pedis pulses are 2+ on the right side, and 2+ on the left side.  Pulmonary/Chest: Effort normal and breath sounds normal. No accessory muscle usage. No tachypnea. No respiratory distress. She has no wheezes.  Abdominal: Normal appearance and bowel sounds are normal. She exhibits no distension.  Musculoskeletal: Normal range of motion.  Neurological: She is alert and oriented to person, place, and time. She has normal strength. She is not disoriented. She displays no atrophy and no tremor. No cranial nerve deficit or sensory deficit. She exhibits normal muscle tone. She displays a negative Romberg sign. She displays no seizure activity. Coordination and gait normal. GCS eye subscore is 4. GCS  verbal subscore is 5. GCS motor subscore is 6.  No active tremor, tics, or fasciculations 5/5 strength in bilateral UE, normal grip strength  Skin: Skin is warm and dry. No rash noted. She is not diaphoretic.  Psychiatric: She has a normal mood and affect. Her behavior is normal.  Intermittently tearful  Nursing note and vitals reviewed. Negative Chvostek sign and negative troussseau's sign  ED Course  Procedures (including critical care time) DIAGNOSTIC STUDIES: Oxygen Saturation is 100% on RA, normal by my interpretation.    COORDINATION OF CARE: 8:16 PM-Discussed treatment plan with pt at bedside and pt agreed to plan.     Labs Review Labs Reviewed  BASIC METABOLIC PANEL - Abnormal; Notable for the following:    Calcium 8.8 (*)    All other components within normal limits  POC URINE PREG, ED    Imaging Review No results found.    EKG Interpretation None      MDM   Patient is a 25 year old female presents to the ER with reports of shaking, "twitching" and  muscle spasms of fingers, arms and occasionally lets for the past several years.  She complains that his recently worsened and she has associated shooting pains in her arms. She is intermittently tearful and expresses frustration. She has seen one urgent care provider in the past several years for this issue. She has no history of head injury.  The Pt and family members at the bedside deny any seizure-like activity, no syncope, no altered mental status, no other associated symptoms.   At the time I examined there was no observed muscle twitches, no abnormal muscle tone, she had normal strength and sensation in all extremities.  Negative Chvostek and Trousseau sign.  Cranial nerves II through XII grossly normal.  I discussed with the patient checking electrolytes to look for abnormalities.  She became tearful and upset when I explained to her that I may not be able to find a reason why she has had 2 years of symptoms.  The  patient did wiggle her fingers a little bit when she was upset but there was no observed movement suspicious for myoclonic jerks. BMP was obtained which was unremarkable except for very mild hypocalcemia of 8.8. A pregnancy test was obtained to ensure that if the patient was given benzos it was safe to do so. She was given Ativan and tramadol in the ER. While patient was being discharged she again became upset and there was some more movement of her fingers and wiggling of her left arm. Unsure if this is psychosomatic or psychogenic. She has been observed multiple times by staff members and by her primary RN to be sleeping without any muscle spasms or tics.  Outpatient referral to neurology was ordered. I explained to the patient that she may need a neurology workup and EEG to evaluate her symptoms however currently there is no emergent medical condition present, and explain I am unable to further evaluate her in the ER. Return precautions were reviewed with the patient and with her mother. They verbalize understanding.  Patient was given Rx of Flexeril and tramadol for pain and muscle spasms. She was given very few Klonopin, to try when her symptoms are very severe, as they're indicated for off label use for involuntary muscle spasms. Felt this option was more appropriate and than Ativan or Valium since it covered the specific symptom patient was having. The medications were reviewed multiple times including specific instructions not to take Klonopin with Flexeril together.   The patient was discharged in good condition, ambulatory, accompanied by her mother who will drive her home, vital signs stable. Filed Vitals:   07/01/15 1917 07/01/15 2320  BP: 105/68 122/62  Pulse: 84 88  Temp: 98.5 F (36.9 C) 98.4 F (36.9 C)  TempSrc: Oral Oral  Resp: 22 20  Height:  (1.549 m)   Weight: 138.12 kg   SpO2: 100% 100%     Final diagnoses:  Muscle spasm   I personally performed the services described  in this documentation, which was scribed in my presence. The recorded information has been reviewed and is accurate.        Danelle Berry, PA-C 07/02/15 [id number]  Tilden Fossa, MD 07/03/15 [id number]

## 2015-07-01 NOTE — ED Notes (Signed)
Pt. reports "twitchings" at fingers and arms for 2 years , denies injury .

## 2015-07-31 ENCOUNTER — Encounter: Payer: Self-pay | Admitting: Internal Medicine

## 2015-07-31 ENCOUNTER — Other Ambulatory Visit (HOSPITAL_COMMUNITY)
Admission: RE | Admit: 2015-07-31 | Discharge: 2015-07-31 | Disposition: A | Discharge status: Home / Self Care | Payer: Medicaid Other | Source: Ambulatory Visit | Attending: Internal Medicine | Admitting: Internal Medicine

## 2015-07-31 ENCOUNTER — Ambulatory Visit (INDEPENDENT_AMBULATORY_CARE_PROVIDER_SITE_OTHER): Payer: Self-pay | Admitting: Internal Medicine

## 2015-07-31 VITALS — BP 119/68 | HR 100 | Temp 98.4°F | Ht 61.0 in | Wt 304.1 lb

## 2015-07-31 DIAGNOSIS — N760 Acute vaginitis: Secondary | ICD-10-CM | POA: Insufficient documentation

## 2015-07-31 DIAGNOSIS — Z113 Encounter for screening for infections with a predominantly sexual mode of transmission: Secondary | ICD-10-CM | POA: Insufficient documentation

## 2015-07-31 DIAGNOSIS — N898 Other specified noninflammatory disorders of vagina: Secondary | ICD-10-CM

## 2015-07-31 DIAGNOSIS — R3915 Urgency of urination: Secondary | ICD-10-CM

## 2015-07-31 DIAGNOSIS — R300 Dysuria: Secondary | ICD-10-CM

## 2015-07-31 DIAGNOSIS — Z8619 Personal history of other infectious and parasitic diseases: Secondary | ICD-10-CM

## 2015-07-31 LAB — POCT URINE PREGNANCY: PREG TEST UR: NEGATIVE

## 2015-07-31 MED ORDER — MELOXICAM 7.5 MG PO TABS: 7.5000 mg | ORAL_TABLET | Freq: Every day | ORAL | Status: DC

## 2015-07-31 MED ORDER — MELOXICAM 7.5 MG PO TABS: 7.5000 mg | ORAL_TABLET | Freq: Two times a day (BID) | ORAL | Status: DC

## 2015-07-31 NOTE — Patient Instructions (Addendum)
1. Take Meloxicam 7.5 mg once daily for pain relief. 2. You may also take Tylenol 650 mg up to FOUR times daily for added pain. 3. We will call with results.  You may need to come into the hospital in order to be treated.    Trichomoniasis Trichomoniasis is an infection caused by an organism called Trichomonas. The infection can affect both women and men. In women, the outer female genitalia and the vagina are affected. In men, the penis is mainly affected, but the prostate and other reproductive organs can also be involved. Trichomoniasis is a sexually transmitted infection (STI) and is most often passed to another person through sexual contact.  RISK FACTORS  Having unprotected sexual intercourse.  Having sexual intercourse with an infected partner. SIGNS AND SYMPTOMS  Symptoms of trichomoniasis in women include:  Abnormal gray-green frothy vaginal discharge.  Itching and irritation of the vagina.  Itching and irritation of the area outside the vagina. Symptoms of trichomoniasis in men include:   Penile discharge with or without pain.  Pain during urination. This results from inflammation of the urethra. DIAGNOSIS  Trichomoniasis may be found during a Pap test or physical exam. Your health care provider may use one of the following methods to help diagnose this infection:  Testing the pH of the vagina with a test tape.  Using a vaginal swab test that checks for the Trichomonas organism. A test is available that provides results within a few minutes.  Examining a urine sample.  Testing vaginal secretions. Your health care provider may test you for other STIs, including HIV. TREATMENT   You may be given medicine to fight the infection. Women should inform their health care provider if they could be or are pregnant. Some medicines used to treat the infection should not be taken during pregnancy.  Your health care provider may recommend over-the-counter medicines or creams to  decrease itching or irritation.  Your sexual partner will need to be treated if infected.  Your health care provider may test you for infection again 3 months after treatment. HOME CARE INSTRUCTIONS   Take medicines only as directed by your health care provider.  Take over-the-counter medicine for itching or irritation as directed by your health care provider.  Do not have sexual intercourse while you have the infection.  Women should not douche or wear tampons while they have the infection.  Discuss your infection with your partner. Your partner may have gotten the infection from you, or you may have gotten it from your partner.  Have your sex partner get examined and treated if necessary.  Practice safe, informed, and protected sex.  See your health care provider for other STI testing. SEEK MEDICAL CARE IF:   You still have symptoms after you finish your medicine.  You develop abdominal pain.  You have pain when you urinate.  You have bleeding after sexual intercourse.  You develop a rash.  Your medicine makes you sick or makes you throw up (vomit). MAKE SURE YOU:  Understand these instructions.  Will watch your condition.  Will get help right away if you are not doing well or get worse.   This information is not intended to replace advice given to you by your health care provider. Make sure you discuss any questions you have with your health care provider.   Document Released: 08/17/2000 Document Revised: 03/14/2014 Document Reviewed: 12/03/2012 Elsevier Interactive Patient Education [employer]2016 Elsevier Inc.

## 2015-07-31 NOTE — Assessment & Plan Note (Addendum)
Patient reports 4 days of burning with urination, dysuria, urinary urgency, and constant pain.  She denies pain with intercourse or vaginal bleeding, but endorses yellow, thick, vaginal discharge.  She has been feeling hot, but denies fever, chills, SOB, or rash.  She has 1 female sexual partner and they usually use condoms for protection.  She had an episode of Trichomonas and Chlamydia treated with Clindamycin and Azithromycin in December.  She reports her partner was treated at the same time.  She has a previous angioedema allergy to Metronidazole.    On pelvic exam, patient with cervical tenderness and friable cervix.  Thick yellow discharge noted in vaginal vault.  A/P: Trichomonas, likely untreated from previous episode.  Unfortunately, given patient's h/o angioedema with metronidazole, she will need desensitization for treatment.  Therefore, will confirm diagnosis with repeat wet prep before proceeding with desensitization.  If wet prep negative, patient would likely warrant treatment for presumed PID given her cervical tenderness and friability. - Wet Prep - HIV - Mobic 7.5 - 15 mg qday PRN  Addendum: Testing negative excepting Gardnerella.  Patient cannot tolerate Metronidazole and previously unable to obtain Clindamycin vaginal ointment; therefore, will repeat treatment with Clindamycin 300 mg BID x7 days.  Patient notified of results and agrees with plan. - Clindamycin 300 mg BID x7 days

## 2015-07-31 NOTE — Progress Notes (Signed)
Patient ID: Alyssa Goodman, female   DOB: 12/14/1990, 25 y.o.   MRN: [id number]   Subjective:   Patient ID: Alyssa Goodman female   DOB: 08/01/1990 25 y.o.   MRN: [id number]  HPI: AlyssaAyvah L Logan Goodman is a 25 y.o. female with PMH as below, here for eval dysuria.  Please see Problem-Based charting for the status of the patient's chronic medical issues.     Past Medical History  Diagnosis Date  . Asthma   . Foul smelling vaginal discharge 06/27/2012    BV, treated 05/08/14 Trichomonas and BV, most recently treated on 06/28/12. Trichomonas treated on 04/16/13.   . Obesity    Current Outpatient Prescriptions  Medication Sig Dispense Refill  . acyclovir (ZOVIRAX) 400 MG tablet Take 1 tablet (400 mg total) by mouth 3 (three) times daily. for 5 days at the earliest signs of an outbreak. 15 tablet 3  . albuterol (PROVENTIL HFA;VENTOLIN HFA) 108 (90 BASE) MCG/ACT inhaler Inhale 2 puffs into the lungs every 6 (six) hours as needed for wheezing.    . clonazePAM (KLONOPIN) 0.5 MG tablet Take 1 tablet (0.5 mg total) by mouth 2 (two) times daily as needed (for motor tics/involuntary muscle spasms). 10 tablet 0  . cyclobenzaprine (FLEXERIL) 10 MG tablet Take 1 tablet (10 mg total) by mouth 2 (two) times daily as needed for muscle spasms. 20 tablet 0  . meloxicam (MOBIC) 7.5 MG tablet Take 1 tablet (7.5 mg total) by mouth daily. 14 tablet 0  . traMADol (ULTRAM) 50 MG tablet Take 1 tablet (50 mg total) by mouth every 6 (six) hours as needed. 15 tablet 0   No current facility-administered medications for this visit.   No family history on file. Social History   Social History  . Marital Status: Single    Spouse Name: N/A  . Number of Children: N/A  . Years of Education: 14   Occupational History  . UNEMPLOYED    Social History Main Topics  . Smoking status: Former Smoker    Types: Cigarettes    Quit date: 05/09/2013  . Smokeless tobacco: Never Used  . Alcohol Use: No  . Drug Use: No  . Sexual  Activity: Not Asked   Other Topics Concern  . None   Social History Narrative   Review of Systems: No fevers or chills. Endorses vaginal discharge without bleeding.  Denies dsypareunia.  Objective:  Physical Exam: Filed Vitals:   07/31/15 1532  BP: 119/68  Pulse: 100  Temp: 98.4 F (36.9 C)  TempSrc: Oral  Height: 5\' 1"  (1.549 m)  Weight: 304 lb 1.6 oz (137.939 kg)  SpO2: 99%   Physical Exam  Constitutional: She is oriented to person, place, and time.  Obese female, sitting in chair. NAD  HENT:  Head: Normocephalic and atraumatic.  Eyes: EOM are normal. No scleral icterus.  Neck: No tracheal deviation present.  Pulmonary/Chest: No stridor.  Genitourinary:  External vagina normal. Thick, yellow discharge present in vaginal vault.  Friable cervix with cervical motion tenderness.  No drainage noticeable from os.  Neurological: She is alert and oriented to person, place, and time.  Skin: Skin is warm and dry.     Assessment & Plan:   Patient and case were discussed with Dr. Heide SparkNarendra.  Please refer to Problem Based charting for further documentation.

## 2015-08-01 LAB — HIV ANTIBODY (ROUTINE TESTING W REFLEX): HIV SCREEN 4TH GENERATION: NONREACTIVE

## 2015-08-02 LAB — URINE CULTURE

## 2015-08-04 LAB — URINE CYTOLOGY ANCILLARY ONLY
CHLAMYDIA, DNA PROBE: NEGATIVE
Neisseria Gonorrhea: NEGATIVE

## 2015-08-05 LAB — CERVICOVAGINAL ANCILLARY ONLY: WET PREP (BD AFFIRM): POSITIVE — AB

## 2015-08-06 MED ORDER — CLINDAMYCIN HCL 300 MG PO CAPS: 300.0000 mg | ORAL_CAPSULE | Freq: Two times a day (BID) | ORAL | Status: DC

## 2015-08-06 NOTE — Addendum Note (Signed)
Addended by: Venia MinksAYLOR, Quanda Pavlicek A on: 08/06/2015 11:40 AM   Modules accepted: Orders

## 2015-08-06 NOTE — Progress Notes (Signed)
Internal Medicine Clinic Attending  Case discussed with Dr. Taylor at the time of the visit.  We reviewed the resident's history and exam and pertinent patient test results.  I agree with the assessment, diagnosis, and plan of care documented in the resident's note. 

## 2015-08-07 ENCOUNTER — Telehealth: Payer: Self-pay

## 2015-08-07 NOTE — Telephone Encounter (Signed)
Pt requesting the nurse to call back regarding medication for UTI.

## 2015-08-10 NOTE — Telephone Encounter (Signed)
Return call made to patient-she is unable to afford $40 clindamycin rx sent to [facility].  Is there a less-expensive alternative? Please advise.Criss AlvineGoldston, Zaela Graley Cassady6/5/20179:41 AM

## 2015-08-11 NOTE — Telephone Encounter (Signed)
Discussed with Dr Lynn Itoaylor-pt can use a Good Rx coupon and get [facility]  Good Rx coupon printed and [facility]ed to [facility]([place]).Criss AlvineGoldston, Amulya Quintin Cassady6/6/20179:31 AM

## 2015-08-26 ENCOUNTER — Encounter: Payer: Self-pay | Admitting: *Deleted

## 2015-11-24 ENCOUNTER — Ambulatory Visit: Payer: Self-pay

## 2015-11-24 ENCOUNTER — Other Ambulatory Visit: Payer: Self-pay | Admitting: Occupational Medicine

## 2015-11-24 DIAGNOSIS — M25571 Pain in right ankle and joints of right foot: Secondary | ICD-10-CM

## 2016-01-25 ENCOUNTER — Encounter (HOSPITAL_COMMUNITY): Payer: Self-pay | Admitting: Emergency Medicine

## 2016-01-25 ENCOUNTER — Encounter (HOSPITAL_COMMUNITY): Payer: Self-pay | Admitting: *Deleted

## 2016-01-25 ENCOUNTER — Ambulatory Visit (HOSPITAL_COMMUNITY)
Admission: EM | Admit: 2016-01-25 | Discharge: 2016-01-25 | Disposition: A | Discharge status: Nursing Home (Includes ICF) | Payer: Medicaid Other | Attending: Emergency Medicine | Admitting: Emergency Medicine

## 2016-01-25 ENCOUNTER — Inpatient Hospital Stay (HOSPITAL_COMMUNITY): Payer: Medicaid Other

## 2016-01-25 ENCOUNTER — Inpatient Hospital Stay (HOSPITAL_COMMUNITY)
Admission: AD | Admit: 2016-01-25 | Discharge: 2016-01-25 | Disposition: A | Discharge status: Home / Self Care | Payer: Medicaid Other | Source: Ambulatory Visit | Attending: Family Medicine | Admitting: Family Medicine

## 2016-01-25 DIAGNOSIS — R1031 Right lower quadrant pain: Secondary | ICD-10-CM

## 2016-01-25 DIAGNOSIS — R109 Unspecified abdominal pain: Secondary | ICD-10-CM

## 2016-01-25 DIAGNOSIS — E669 Obesity, unspecified: Secondary | ICD-10-CM | POA: Diagnosis not present

## 2016-01-25 DIAGNOSIS — O26891 Other specified pregnancy related conditions, first trimester: Secondary | ICD-10-CM | POA: Diagnosis present

## 2016-01-25 DIAGNOSIS — R938 Abnormal findings on diagnostic imaging of other specified body structures: Secondary | ICD-10-CM | POA: Insufficient documentation

## 2016-01-25 DIAGNOSIS — N898 Other specified noninflammatory disorders of vagina: Secondary | ICD-10-CM | POA: Diagnosis not present

## 2016-01-25 DIAGNOSIS — Z87891 Personal history of nicotine dependence: Secondary | ICD-10-CM | POA: Insufficient documentation

## 2016-01-25 DIAGNOSIS — O99512 Diseases of the respiratory system complicating pregnancy, second trimester: Secondary | ICD-10-CM | POA: Diagnosis not present

## 2016-01-25 DIAGNOSIS — O219 Vomiting of pregnancy, unspecified: Secondary | ICD-10-CM | POA: Diagnosis not present

## 2016-01-25 DIAGNOSIS — O26892 Other specified pregnancy related conditions, second trimester: Secondary | ICD-10-CM | POA: Insufficient documentation

## 2016-01-25 DIAGNOSIS — Z6841 Body Mass Index (BMI) 40.0 and over, adult: Secondary | ICD-10-CM | POA: Insufficient documentation

## 2016-01-25 DIAGNOSIS — J45909 Unspecified asthma, uncomplicated: Secondary | ICD-10-CM | POA: Diagnosis not present

## 2016-01-25 DIAGNOSIS — R102 Pelvic and perineal pain: Secondary | ICD-10-CM | POA: Insufficient documentation

## 2016-01-25 DIAGNOSIS — Z3A01 Less than 8 weeks gestation of pregnancy: Secondary | ICD-10-CM | POA: Insufficient documentation

## 2016-01-25 DIAGNOSIS — O99212 Obesity complicating pregnancy, second trimester: Secondary | ICD-10-CM | POA: Diagnosis not present

## 2016-01-25 DIAGNOSIS — R1030 Lower abdominal pain, unspecified: Secondary | ICD-10-CM | POA: Diagnosis present

## 2016-01-25 LAB — CBC
HEMATOCRIT: 39.5 % (ref 36.0–46.0)
Hemoglobin: 12.6 g/dL (ref 12.0–15.0)
MCH: 26.9 pg (ref 26.0–34.0)
MCHC: 31.9 g/dL (ref 30.0–36.0)
MCV: 84.4 fL (ref 78.0–100.0)
PLATELETS: 302 10*3/uL (ref 150–400)
RBC: 4.68 MIL/uL (ref 3.87–5.11)
RDW: 15.1 % (ref 11.5–15.5)
WBC: 13.2 10*3/uL — ABNORMAL HIGH (ref 4.0–10.5)

## 2016-01-25 LAB — POCT URINALYSIS DIP (DEVICE)
Bilirubin Urine: NEGATIVE
GLUCOSE, UA: NEGATIVE mg/dL
Ketones, ur: NEGATIVE mg/dL
NITRITE: NEGATIVE
Protein, ur: 30 mg/dL — AB
Specific Gravity, Urine: >=1.03 (ref 1.005–1.030)
UROBILINOGEN UA: 0.2 mg/dL (ref 0.0–1.0)
pH: 6 (ref 5.0–8.0)

## 2016-01-25 LAB — HCG, QUANTITATIVE, PREGNANCY: HCG, BETA CHAIN, QUANT, S: 15584 m[IU]/mL — AB (ref <5)

## 2016-01-25 LAB — POCT PREGNANCY, URINE: PREG TEST UR: POSITIVE — AB

## 2016-01-25 MED ORDER — STERILE WATER FOR INJECTION IJ SOLN
INTRAMUSCULAR | Status: AC
  Filled 2016-01-25: qty 10

## 2016-01-25 MED ORDER — AZITHROMYCIN 250 MG PO TABS
ORAL_TABLET | ORAL | Status: AC
  Filled 2016-01-25: qty 4

## 2016-01-25 MED ORDER — AZITHROMYCIN 250 MG PO TABS
1000.0000 mg | ORAL_TABLET | Freq: Once | ORAL | Status: AC
  Administered 2016-01-25: 1000 mg via ORAL

## 2016-01-25 MED ORDER — CEFTRIAXONE SODIUM 250 MG IJ SOLR
INTRAMUSCULAR | Status: AC
  Filled 2016-01-25: qty 250

## 2016-01-25 MED ORDER — PROMETHAZINE HCL 25 MG PO TABS: 12.5000 mg | ORAL_TABLET | Freq: Four times a day (QID) | ORAL | 0 refills | Status: DC | PRN

## 2016-01-25 MED ORDER — CEFTRIAXONE SODIUM 250 MG IJ SOLR
250.0000 mg | Freq: Once | INTRAMUSCULAR | Status: AC
  Administered 2016-01-25: 250 mg via INTRAMUSCULAR

## 2016-01-25 NOTE — MAU Note (Signed)
PT HAS ARRIVED  FROM URGENT  CARE-   FOR LABS AND  U/S.    HAS LOWER ABD  PAIN- STARTED  1 WEEK AGO-  RATED - 7.    LAST SEX- 3 WEEKS  AGO.

## 2016-01-25 NOTE — Discharge Instructions (Signed)
Prenatal Care [facility]    [facility]  [phone]     Phone: [phone]          [facility]                      [facility]  @[place]     Phone: [phone]  Phone: [phone]         [facility]     Phone: [phone]  Phone: [phone]           [facility] [place]                hone: [phone]  Phone: [phone]         [facility] Dr. Francoise Ceo      Phone: [phone]  Phone: [phone]         [facility]                Phone: [phone]  [facility]   [phone]    [facility] [address] [place])          Phone: [phone] [facility]  Safe Medications in Pregnancy   Acne: Benzoyl Peroxide Salicylic Acid  Backache/Headache: Tylenol: 2 regular strength every 4 hours OR              2 Extra strength every 6 hours  Colds/Coughs/Allergies: Benadryl (alcohol free) 25 mg every 6 hours as needed Breath right strips Claritin Cepacol throat lozenges Chloraseptic throat spray Cold-Eeze- up to three times per day Cough drops, alcohol free Flonase (by prescription only) Guaifenesin Mucinex Robitussin DM (plain only, alcohol free) Saline nasal spray/drops Sudafed (pseudoephedrine) & Actifed ** use only after [redacted] weeks gestation and if you do not have high blood pressure Tylenol Vicks Vaporub Zinc lozenges Zyrtec   Constipation: Colace Ducolax suppositories Fleet enema Glycerin suppositories Metamucil Milk of magnesia Miralax Senokot Smooth move tea  Diarrhea: Kaopectate Imodium A-D  *NO pepto Bismol  Hemorrhoids: Anusol Anusol HC Preparation H Tucks  Indigestion: Tums Maalox Mylanta Zantac  Pepcid  Insomnia: Benadryl (alcohol free) 25mg  every 6 hours as needed Tylenol PM Unisom, no  Gelcaps  Leg Cramps: Tums MagGel  Nausea/Vomiting:  Bonine Dramamine Emetrol Ginger extract Sea bands Meclizine  Nausea medication to take during pregnancy:  Unisom (doxylamine succinate 25 mg tablets) Take one tablet daily at bedtime. If symptoms are not adequately controlled, the dose can be increased to a maximum recommended dose of two tablets daily (1/2 tablet in the morning, 1/2 tablet mid-afternoon and one at bedtime). Vitamin B6 100mg  tablets. Take one tablet twice a day (up to 200 mg per day).  Skin Rashes: Aveeno products Benadryl cream or 25mg  every 6 hours as needed Calamine Lotion 1% cortisone cream  Yeast infection: Gyne-lotrimin 7 Monistat 7   **If taking multiple medications, please check labels to avoid duplicating the same active ingredients **take medication as directed on the label ** Do not exceed 4000 mg of tylenol in 24 hours **Do not take medications that contain aspirin or ibuprofen     First Trimester of Pregnancy The first trimester of pregnancy is from week 1 until the end of week 12 (months 1 through 3). A week after a sperm fertilizes an egg, the egg will implant on the wall of the uterus. This embryo will begin  to develop into a baby. Genes from you and your partner are forming the baby. The female genes determine whether the baby is a boy or a girl. At 6-8 weeks, the eyes and face are formed, and the heartbeat can be seen on ultrasound. At the end of 12 weeks, all the baby's organs are formed.  Now that you are pregnant, you will want to do everything you can to have a healthy baby. Two of the most important things are to get good prenatal care and to follow your health care provider's instructions. Prenatal care is all the medical care you receive before the baby's birth. This care will help prevent, find, and treat any problems during the pregnancy and childbirth. BODY CHANGES Your body goes through many changes during pregnancy. The changes  vary from woman to woman.   You may gain or lose a couple of pounds at first.  You may feel sick to your stomach (nauseous) and throw up (vomit). If the vomiting is uncontrollable, call your health care provider.  You may tire easily.  You may develop headaches that can be relieved by medicines approved by your health care provider.  You may urinate more often. Painful urination may mean you have a bladder infection.  You may develop heartburn as a result of your pregnancy.  You may develop constipation because certain hormones are causing the muscles that push waste through your intestines to slow down.  You may develop hemorrhoids or swollen, bulging veins (varicose veins).  Your breasts may begin to grow larger and become tender. Your nipples may stick out more, and the tissue that surrounds them (areola) may become darker.  Your gums may bleed and may be sensitive to brushing and flossing.  Dark spots or blotches (chloasma, mask of pregnancy) may develop on your face. This will likely fade after the baby is born.  Your menstrual periods will stop.  You may have a loss of appetite.  You may develop cravings for certain kinds of food.  You may have changes in your emotions from day to day, such as being excited to be pregnant or being concerned that something may go wrong with the pregnancy and baby.  You may have more vivid and strange dreams.  You may have changes in your hair. These can include thickening of your hair, rapid growth, and changes in texture. Some women also have hair loss during or after pregnancy, or hair that feels dry or thin. Your hair will most likely return to normal after your baby is born. WHAT TO EXPECT AT YOUR PRENATAL VISITS During a routine prenatal visit:  You will be weighed to make sure you and the baby are growing normally.  Your blood pressure will be taken.  Your abdomen will be measured to track your baby's growth.  The fetal  heartbeat will be listened to starting around week 10 or 12 of your pregnancy.  Test results from any previous visits will be discussed. Your health care provider may ask you:  How you are feeling.  If you are feeling the baby move.  If you have had any abnormal symptoms, such as leaking fluid, bleeding, severe headaches, or abdominal cramping.  If you are using any tobacco products, including cigarettes, chewing tobacco, and electronic cigarettes.  If you have any questions. Other tests that may be performed during your first trimester include:  Blood tests to find your blood type and to check for the presence of any previous infections. They will  also be used to check for low iron levels (anemia) and Rh antibodies. Later in the pregnancy, blood tests for diabetes will be done along with other tests if problems develop.  Urine tests to check for infections, diabetes, or protein in the urine.  An ultrasound to confirm the proper growth and development of the baby.  An amniocentesis to check for possible genetic problems.  Fetal screens for spina bifida and Down syndrome.  You may need other tests to make sure you and the baby are doing well.  HIV (human immunodeficiency virus) testing. Routine prenatal testing includes screening for HIV, unless you choose not to have this test. HOME CARE INSTRUCTIONS  Medicines   Follow your health care provider's instructions regarding medicine use. Specific medicines may be either safe or unsafe to take during pregnancy.  Take your prenatal vitamins as directed.  If you develop constipation, try taking a stool softener if your health care provider approves. Diet   Eat regular, well-balanced meals. Choose a variety of foods, such as meat or vegetable-based protein, fish, milk and low-fat dairy products, vegetables, fruits, and whole grain breads and cereals. Your health care provider will help you determine the amount of weight gain that is  right for you.  Avoid raw meat and uncooked cheese. These carry germs that can cause birth defects in the baby.  Eating four or five small meals rather than three large meals a day may help relieve nausea and vomiting. If you start to feel nauseous, eating a few soda crackers can be helpful. Drinking liquids between meals instead of during meals also seems to help nausea and vomiting.  If you develop constipation, eat more high-fiber foods, such as fresh vegetables or fruit and whole grains. Drink enough fluids to keep your urine clear or pale yellow. Activity and Exercise   Exercise only as directed by your health care provider. Exercising will help you:  Control your weight.  Stay in shape.  Be prepared for labor and delivery.  Experiencing pain or cramping in the lower abdomen or low back is a good sign that you should stop exercising. Check with your health care provider before continuing normal exercises.  Try to avoid standing for long periods of time. Move your legs often if you must stand in one place for a long time.  Avoid heavy lifting.  Wear low-heeled shoes, and practice good posture.  You may continue to have sex unless your health care provider directs you otherwise. Relief of Pain or Discomfort   Wear a good support bra for breast tenderness.   Take warm sitz baths to soothe any pain or discomfort caused by hemorrhoids. Use hemorrhoid cream if your health care provider approves.   Rest with your legs elevated if you have leg cramps or low back pain.  If you develop varicose veins in your legs, wear support hose. Elevate your feet for 15 minutes, 3-4 times a day. Limit salt in your diet. Prenatal Care   Schedule your prenatal visits by the twelfth week of pregnancy. They are usually scheduled monthly at first, then more often in the last 2 months before delivery.  Write down your questions. Take them to your prenatal visits.  Keep all your prenatal visits as  directed by your health care provider. Safety   Wear your seat belt at all times when driving.  Make a list of emergency phone numbers, including numbers for family, friends, the hospital, and police and fire departments. General Tips  Ask your health care provider for a referral to a local prenatal education class. Begin classes no later than at the beginning of month 6 of your pregnancy.  Ask for help if you have counseling or nutritional needs during pregnancy. Your health care provider can offer advice or refer you to specialists for help with various needs.  Do not use hot tubs, steam rooms, or saunas.  Do not douche or use tampons or scented sanitary pads.  Do not cross your legs for long periods of time.  Avoid cat litter boxes and soil used by cats. These carry germs that can cause birth defects in the baby and possibly loss of the fetus by miscarriage or stillbirth.  Avoid all smoking, herbs, alcohol, and medicines not prescribed by your health care provider. Chemicals in these affect the formation and growth of the baby.  Do not use any tobacco products, including cigarettes, chewing tobacco, and electronic cigarettes. If you need help quitting, ask your health care provider. You may receive counseling support and other resources to help you quit.  Schedule a dentist appointment. At home, brush your teeth with a soft toothbrush and be gentle when you floss. SEEK MEDICAL CARE IF:   You have dizziness.  You have mild pelvic cramps, pelvic pressure, or nagging pain in the abdominal area.  You have persistent nausea, vomiting, or diarrhea.  You have a bad smelling vaginal discharge.  You have pain with urination.  You notice increased swelling in your face, hands, legs, or ankles. SEEK IMMEDIATE MEDICAL CARE IF:   You have a fever.  You are leaking fluid from your vagina.  You have spotting or bleeding from your vagina.  You have severe abdominal cramping or  pain.  You have rapid weight gain or loss.  You vomit blood or material that looks like coffee grounds.  You are exposed to [place] measles and have never had them.  You are exposed to fifth disease or chickenpox.  You develop a severe headache.  You have shortness of breath.  You have any kind of trauma, such as from a fall or a car accident. This information is not intended to replace advice given to you by your health care provider. Make sure you discuss any questions you have with your health care provider. Document Released: 02/15/2001 Document Revised: 03/14/2014 Document Reviewed: 01/01/2013 Elsevier Interactive Patient Education  2017 [employer]

## 2016-01-25 NOTE — MAU Provider Note (Signed)
History     CSN: [id number]  Arrival date and time: 01/25/16 2035   First Provider Initiated Contact with Patient 01/25/16 2039      Chief Complaint  Patient presents with  . Pelvic Pain   Pelvic Pain  The patient's primary symptoms include pelvic pain and vaginal discharge. This is a new problem. The current episode started in the past 7 days. The problem occurs intermittently. The problem has been unchanged. Pain severity now: 7/10. The problem affects both sides. She is pregnant. Associated symptoms include abdominal pain and nausea. Pertinent negatives include no chills, constipation, diarrhea, dysuria, fever, frequency, urgency or vomiting. The vaginal discharge was white. There has been no bleeding. Nothing aggravates the symptoms. She has tried nothing for the symptoms. She is sexually active. It is unknown whether or not her partner has an STD. Menstrual history: LMP 12/22/15     Past Medical History:  Diagnosis Date  . Asthma   . Foul smelling vaginal discharge 06/27/2012   BV, treated 05/08/14 Trichomonas and BV, most recently treated on 06/28/12. Trichomonas treated on 04/16/13.   . Obesity     No past surgical history on file.  No family history on file.  Social History  Substance Use Topics  . Smoking status: Former Smoker    Types: Cigarettes    Quit date: 05/09/2013  . Smokeless tobacco: Never Used  . Alcohol use No    Allergies:  Allergies  Allergen Reactions  . Shellfish Allergy     Mostly oysters and limited to number of shrimp.  . Metronidazole Rash    Prescriptions Prior to Admission  Medication Sig Dispense Refill Last Dose  . acyclovir (ZOVIRAX) 400 MG tablet Take 1 tablet (400 mg total) by mouth 3 (three) times daily. for 5 days at the earliest signs of an outbreak. 15 tablet 3   . albuterol (PROVENTIL HFA;VENTOLIN HFA) 108 (90 BASE) MCG/ACT inhaler Inhale 2 puffs into the lungs every 6 (six) hours as needed for wheezing.   Not Taking  . clindamycin  (CLEOCIN) 300 MG capsule Take 1 capsule (300 mg total) by mouth 2 (two) times daily. 14 capsule 0   . clonazePAM (KLONOPIN) 0.5 MG tablet Take 1 tablet (0.5 mg total) by mouth 2 (two) times daily as needed (for motor tics/involuntary muscle spasms). 10 tablet 0   . cyclobenzaprine (FLEXERIL) 10 MG tablet Take 1 tablet (10 mg total) by mouth 2 (two) times daily as needed for muscle spasms. 20 tablet 0   . meloxicam (MOBIC) 7.5 MG tablet Take 1 tablet (7.5 mg total) by mouth daily. 14 tablet 0   . traMADol (ULTRAM) 50 MG tablet Take 1 tablet (50 mg total) by mouth every 6 (six) hours as needed. 15 tablet 0     Review of Systems  Constitutional: Negative for chills and fever.  Gastrointestinal: Positive for abdominal pain and nausea. Negative for constipation, diarrhea and vomiting.  Genitourinary: Positive for pelvic pain and vaginal discharge. Negative for dysuria, frequency and urgency.   Physical Exam   Last menstrual period 12/22/2015.  Physical Exam  Nursing note and vitals reviewed. Constitutional: She is oriented to person, place, and time. She appears well-developed and well-nourished. No distress.  HENT:  Head: Normocephalic.  Cardiovascular: Normal rate.   Respiratory: Effort normal.  GI: Soft. There is no tenderness. There is no rebound.  Neurological: She is alert and oriented to person, place, and time.  Skin: Skin is warm and dry.  Psychiatric: She has a  normal mood and affect.   US Ob Comp Less 14 Wks  Result Date: 01/25/2016 CLINICAL DATA:  Lower abdominal pain for 1 week. Estimated gestational age by LMP is 4 weeks 6 days. Quantitative beta HCG is pending. EXAM: OBSTETRIC <14 WK [name] AND TRANSVAGINAL OB US TECHNIQUE: Both transabdominal and transvaginal ultrasound examinations were performed for complete evaluation of the gestation as well as the maternal uterus, adnexal regions, and pelvic cul-de-sac. Transvaginal technique was performed to assess early pregnancy.  COMPARISON:  None. FINDINGS: Intrauterine gestational sac: A single intrauterine gestational sac is present. Yolk sac:  Yolk sac is present. Embryo:  Fetal pole is not definitely identified. Cardiac Activity: Not identified. MSD: 10.4  mm   5 w   5  d Subchorionic hemorrhage:  None visualized. Maternal uterus/adnexae: Uterus is anteverted. Hypodense myometrial nodule anteriorly to the right measuring 3 x 2.5 x 2.8 cm consistent with the uterine fibroid. Small nabothian cysts in the cervix. Ovaries are not visualized but no abnormal adnexal masses are seen. No free fluid in the pelvis. IMPRESSION: Probable early intrauterine gestational sac with a yolk sac, but no fetal pole or cardiac activity yet visualized. Recommend follow-up quantitative B-HCG levels and follow-up US in 14 days to assess viability. This recommendation follows SRU consensus guidelines: Diagnostic Criteria for Nonviable Pregnancy Early in the First Trimester. Malva Limes Med 2013; 161:0960-45. Electronically Signed   By: Burman Nieves M.D.   On: 01/25/2016 21:46   US Ob Transvaginal  Result Date: 01/25/2016 CLINICAL DATA:  Lower abdominal pain for 1 week. Estimated gestational age by LMP is 4 weeks 6 days. Quantitative beta HCG is pending. EXAM: OBSTETRIC <14 WK [name] AND TRANSVAGINAL OB US TECHNIQUE: Both transabdominal and transvaginal ultrasound examinations were performed for complete evaluation of the gestation as well as the maternal uterus, adnexal regions, and pelvic cul-de-sac. Transvaginal technique was performed to assess early pregnancy. COMPARISON:  None. FINDINGS: Intrauterine gestational sac: A single intrauterine gestational sac is present. Yolk sac:  Yolk sac is present. Embryo:  Fetal pole is not definitely identified. Cardiac Activity: Not identified. MSD: 10.4  mm   5 w   5  d Subchorionic hemorrhage:  None visualized. Maternal uterus/adnexae: Uterus is anteverted. Hypodense myometrial nodule anteriorly to the right measuring  3 x 2.5 x 2.8 cm consistent with the uterine fibroid. Small nabothian cysts in the cervix. Ovaries are not visualized but no abnormal adnexal masses are seen. No free fluid in the pelvis. IMPRESSION: Probable early intrauterine gestational sac with a yolk sac, but no fetal pole or cardiac activity yet visualized. Recommend follow-up quantitative B-HCG levels and follow-up US in 14 days to assess viability. This recommendation follows SRU consensus guidelines: Diagnostic Criteria for Nonviable Pregnancy Early in the First Trimester. Malva Limes Med 2013; 409:8119-14. Electronically Signed   By: Burman Nieves M.D.   On: 01/25/2016 21:46   Results for orders placed or performed during the hospital encounter of 01/25/16 (from the past 24 hour(s))  ABO/Rh     Status: None (Preliminary result)   Collection Time: 01/25/16  9:14 PM  Result Value Ref Range   ABO/RH(D) O POS   hCG, quantitative, pregnancy     Status: Abnormal   Collection Time: 01/25/16  9:14 PM  Result Value Ref Range   hCG, Beta Chain, Quant, S 15,584 (H) <5 mIU/mL  CBC     Status: Abnormal   Collection Time: 01/25/16  9:14 PM  Result Value Ref Range  WBC 13.2 (H) 4.0 - 10.5 K/uL   RBC 4.68 3.87 - 5.11 MIL/uL   Hemoglobin 12.6 12.0 - 15.0 g/dL   HCT 16.139.5 09.636.0 - 04.546.0 %   MCV 84.4 78.0 - 100.0 fL   MCH 26.9 26.0 - 34.0 pg   MCHC 31.9 30.0 - 36.0 g/dL   RDW 40.915.1 81.111.5 - 91.415.5 %   Platelets 302 150 - 400 K/uL    MAU Course  Procedures  MDM   Assessment and Plan   1. Pelvic pain affecting pregnancy in first trimester, antepartum   2. [redacted] weeks gestation of pregnancy   3. Nausea/vomiting in pregnancy    DC home Comfort measures reviewed  1st Trimester precautions  RX: phenergan PRN #[id number]  Return to MAU as needed FU with OB as planned  Follow-up Information    [facility].   Contact information: [address] [place] [address] [phone]           Tawnya CrookHogan, Dempsey Knotek Donovan 01/25/2016,  8:44 PM

## 2016-01-25 NOTE — ED Provider Notes (Signed)
CSN: [id number]     Arrival date & time 01/25/16  1637 History   None    Chief Complaint  Patient presents with  . Abdominal Pain   (Consider location/radiation/quality/duration/timing/severity/associated sxs/prior Treatment) The history is provided by the patient. No language interpreter was used.  Abdominal Pain  Pain location:  RLQ and LLQ Pain quality: aching   Pain radiates to:  Does not radiate Pain severity:  Moderate Timing:  Constant Progression:  Worsening Chronicity:  New Relieved by:  Nothing Worsened by:  Nothing Ineffective treatments:  None tried Associated symptoms: vaginal discharge   Pt complains of vaginal discharge.  Pt also reports possible pregnancy. Pt complains of lower abdominal pain into her back  Past Medical History:  Diagnosis Date  . Asthma   . Foul smelling vaginal discharge 06/27/2012   BV, treated 05/08/14 Trichomonas and BV, most recently treated on 06/28/12. Trichomonas treated on 04/16/13.   . Obesity    History reviewed. No pertinent surgical history. No family history on file. Social History  Substance Use Topics  . Smoking status: Former Smoker    Types: Cigarettes    Quit date: 05/09/2013  . Smokeless tobacco: Never Used  . Alcohol use No   OB History    No data available     Review of Systems  Gastrointestinal: Positive for abdominal pain.  Genitourinary: Positive for vaginal discharge.  All other systems reviewed and are negative.   Allergies  Shellfish allergy and Metronidazole  Home Medications   Prior to Admission medications   Medication Sig Start Date End Date Taking? Authorizing Provider  acyclovir (ZOVIRAX) 400 MG tablet Take 1 tablet (400 mg total) by mouth 3 (three) times daily. for 5 days at the earliest signs of an outbreak. 10/08/14   Marrian SalvageJacquelyn S Gill, MD  albuterol (PROVENTIL HFA;VENTOLIN HFA) 108 (90 BASE) MCG/ACT inhaler Inhale 2 puffs into the lungs every 6 (six) hours as needed for wheezing.    Historical  Provider, MD  clindamycin (CLEOCIN) 300 MG capsule Take 1 capsule (300 mg total) by mouth 2 (two) times daily. 08/06/15   Jana HalfNicholas A Taylor, MD  clonazePAM (KLONOPIN) 0.5 MG tablet Take 1 tablet (0.5 mg total) by mouth 2 (two) times daily as needed (for motor tics/involuntary muscle spasms). 07/01/15   Danelle BerryLeisa Tapia, PA-C  cyclobenzaprine (FLEXERIL) 10 MG tablet Take 1 tablet (10 mg total) by mouth 2 (two) times daily as needed for muscle spasms. 07/01/15   Danelle BerryLeisa Tapia, PA-C  meloxicam (MOBIC) 7.5 MG tablet Take 1 tablet (7.5 mg total) by mouth daily. 07/31/15   Jana HalfNicholas A Taylor, MD  traMADol (ULTRAM) 50 MG tablet Take 1 tablet (50 mg total) by mouth every 6 (six) hours as needed. 07/01/15   Danelle BerryLeisa Tapia, PA-C   Meds Ordered and Administered this Visit   Medications  cefTRIAXone (ROCEPHIN) injection 250 mg (not administered)  azithromycin (ZITHROMAX) tablet 1,000 mg (not administered)    BP (!) 109/48   Pulse 90   Temp 99.1 F (37.3 C) (Oral)   Resp 16   Ht 5\' 1"  (1.549 m)   Wt 295 lb (133.8 kg)   LMP 12/22/2015   SpO2 100%   BMI 55.74 kg/m  No data found.   Physical Exam  Constitutional: She is oriented to person, place, and time. She appears well-developed and well-nourished.  HENT:  Head: Normocephalic.  Eyes: EOM are normal.  Neck: Normal range of motion.  Cardiovascular: Normal rate.   Pulmonary/Chest: Effort normal and breath sounds  normal.  Abdominal: Soft. She exhibits no distension.  Genitourinary: Vaginal discharge found.  Genitourinary Comments: Thick green discharge.    Musculoskeletal: Normal range of motion.  Neurological: She is alert and oriented to person, place, and time.  Skin: Skin is warm.  Psychiatric: She has a normal mood and affect.  Nursing note and vitals reviewed.   Urgent Care Course   Clinical Course     Procedures (including critical care time)  Labs Review Labs Reviewed  POCT URINALYSIS DIP (DEVICE) - Abnormal; Notable for the following:        Result Value   Hgb urine dipstick SMALL (*)    Protein, ur 30 (*)    Leukocytes, UA LARGE (*)    All other components within normal limits  URINALYSIS, DIPSTICK ONLY  CERVICOVAGINAL ANCILLARY ONLY    Imaging Review No results found.   Visual Acuity Review  Right Eye Distance:   Left Eye Distance:   Bilateral Distance:    Right Eye Near:   Left Eye Near:    Bilateral Near:         MDM  Vaginal discharge concerning for std.  Pt is early pregnant and having a abdominal pain.  [facility] of early pregnancy   1. Vaginal discharge   2. Abdominal pain, unspecified abdominal location   3. Right lower quadrant abdominal pain     Meds ordered this encounter  Medications  . cefTRIAXone (ROCEPHIN) injection 250 mg    Order Specific Question:   Antibiotic Indication:    Answer:   STD  . azithromycin (ZITHROMAX) tablet 1,000 mg     Elson AreasLeslie K Morganna Styles, PA-C 01/25/16 1932

## 2016-01-25 NOTE — ED Triage Notes (Signed)
PT reports lower abdominal pain over RLQ that wraps arounnd to lower back. PT is nauseated. 1 episode diarrhea. Normal BM this AM. LMP 10/17  PT reports new white/milky vaginal discharge.   PT denies dysuria

## 2016-01-26 LAB — CERVICOVAGINAL ANCILLARY ONLY
Chlamydia: NEGATIVE
NEISSERIA GONORRHEA: NEGATIVE
WET PREP (BD AFFIRM): NEGATIVE

## 2016-01-26 LAB — RPR: RPR Ser Ql: NONREACTIVE

## 2016-01-26 LAB — ABO/RH: ABO/RH(D): O POS

## 2016-01-26 LAB — HIV ANTIBODY (ROUTINE TESTING W REFLEX): HIV Screen 4th Generation wRfx: NONREACTIVE

## 2016-02-04 ENCOUNTER — Telehealth (HOSPITAL_COMMUNITY): Payer: Self-pay | Admitting: Emergency Medicine

## 2016-02-04 NOTE — Telephone Encounter (Signed)
Returned pt's call... Pt needing lab results from visit on 11/20  Reports she was able to log onto MyChart and access results  Adv pt to [facility] or [facility]CP if having new problems  Pt verb understanding.

## 2016-02-26 DIAGNOSIS — Z3490 Encounter for supervision of normal pregnancy, unspecified, unspecified trimester: Secondary | ICD-10-CM | POA: Insufficient documentation

## 2016-03-01 ENCOUNTER — Encounter: Payer: Self-pay | Admitting: Obstetrics and Gynecology

## 2016-03-01 ENCOUNTER — Ambulatory Visit (INDEPENDENT_AMBULATORY_CARE_PROVIDER_SITE_OTHER): Payer: Medicaid Other | Admitting: Obstetrics and Gynecology

## 2016-03-01 ENCOUNTER — Other Ambulatory Visit (HOSPITAL_COMMUNITY)
Admission: RE | Admit: 2016-03-01 | Discharge: 2016-03-01 | Disposition: A | Discharge status: Home / Self Care | Payer: Medicaid Other | Source: Ambulatory Visit | Attending: Obstetrics and Gynecology | Admitting: Obstetrics and Gynecology

## 2016-03-01 VITALS — BP 127/83 | HR 73 | Wt 299.0 lb

## 2016-03-01 DIAGNOSIS — O98311 Other infections with a predominantly sexual mode of transmission complicating pregnancy, first trimester: Secondary | ICD-10-CM

## 2016-03-01 DIAGNOSIS — O99211 Obesity complicating pregnancy, first trimester: Secondary | ICD-10-CM | POA: Diagnosis not present

## 2016-03-01 DIAGNOSIS — O99210 Obesity complicating pregnancy, unspecified trimester: Secondary | ICD-10-CM | POA: Insufficient documentation

## 2016-03-01 DIAGNOSIS — A6009 Herpesviral infection of other urogenital tract: Secondary | ICD-10-CM | POA: Diagnosis not present

## 2016-03-01 DIAGNOSIS — E669 Obesity, unspecified: Secondary | ICD-10-CM

## 2016-03-01 DIAGNOSIS — Z113 Encounter for screening for infections with a predominantly sexual mode of transmission: Secondary | ICD-10-CM | POA: Insufficient documentation

## 2016-03-01 DIAGNOSIS — Z23 Encounter for immunization: Secondary | ICD-10-CM

## 2016-03-01 DIAGNOSIS — O98319 Other infections with a predominantly sexual mode of transmission complicating pregnancy, unspecified trimester: Secondary | ICD-10-CM

## 2016-03-01 DIAGNOSIS — Z3490 Encounter for supervision of normal pregnancy, unspecified, unspecified trimester: Secondary | ICD-10-CM

## 2016-03-01 MED ORDER — PRENATAL GUMMIES/DHA & FA 0.4-32.5 MG PO CHEW: 1.0000 | CHEWABLE_TABLET | Freq: Every day | ORAL | 11 refills | Status: DC

## 2016-03-01 NOTE — Progress Notes (Signed)
  Subjective:    Alyssa Goodman is a G1P0 5856w0d being seen today for her first obstetrical visit.  Her obstetrical history is significant for mild asthma, and morbid obesity. Patient does intend to breast feed. Pregnancy history fully reviewed.  Patient reports nausea.  Vitals:   03/01/16 0818  BP: 127/83  Pulse: 73  Weight: 299 lb (135.6 kg)    HISTORY: OB History  Gravida Para Term Preterm AB Living  1            SAB TAB Ectopic Multiple Live Births               # Outcome Date GA Lbr Len/2nd Weight Sex Delivery Anes PTL Lv  1 Current              Past Medical History:  Diagnosis Date  . Asthma   . Foul smelling vaginal discharge 06/27/2012   BV, treated 05/08/14 Trichomonas and BV, most recently treated on 06/28/12. Trichomonas treated on 04/16/13.   . Obesity    History reviewed. No pertinent surgical history. Family History  Problem Relation Age of Onset  . Hypertension Mother   . Diabetes Maternal Uncle   . Hypertension Maternal Uncle   . Diabetes Maternal Grandmother   . Hypertension Maternal Grandmother   . Heart disease Maternal Grandmother   . Cancer Maternal Grandfather     colon     Exam    Uterus:   limited evaluation secondary to body habitus  Pelvic Exam:    Perineum: No Hemorrhoids, Normal Perineum   Vulva: normal   Vagina:  normal mucosa, normal discharge   pH:    Cervix: nulliparous appearance and closed and long   Adnexa: limited secondary to body habitus   Bony Pelvis: gynecoid  System: Breast:  normal appearance, no masses or tenderness   Skin: normal coloration and turgor, no rashes    Neurologic: oriented, no focal deficits, grossly non-focal   Extremities: normal strength, tone, and muscle mass   HEENT extra ocular movement intact   Mouth/Teeth mucous membranes moist, pharynx normal without lesions and dental hygiene good   Neck supple and no masses   Cardiovascular: regular rate and rhythm   Respiratory:  chest clear, no wheezing,  crepitations, rhonchi, normal symmetric air entry   Abdomen: soft, non-tender; bowel sounds normal; no masses,  no organomegaly and obese   Urinary:       Assessment:    Pregnancy: G1P0 Patient Active Problem List   Diagnosis Date Noted  . Genital herpes complicating pregnancy 03/01/2016  . Obesity complicating pregnancy 03/01/2016  . Encounter for supervision of normal pregnancy, antepartum 02/26/2016  . Genital herpes 07/16/2013  . Morbid obesity 06/28/2012  . Asthma, chronic 06/27/2012        Plan:     Initial labs drawn. Prenatal vitamins. Problem list reviewed and updated. Genetic Screening discussed First Screen: ordered.  Ultrasound discussed; fetal survey: requested. Hemoglobin A1c ordered secondary to obesity Encouraged healthy eating habits and exercise. Discussed no more than 15 lb total weight gain Flu vaccine today  Follow up in 4 weeks. 50% of 30 min visit spent on counseling and coordination of care.     Olyver Hawes 03/01/2016

## 2016-03-01 NOTE — Progress Notes (Signed)
Baby Scripts offered and declined. Pt wants first trimester screen.

## 2016-03-01 NOTE — Patient Instructions (Signed)
 First Trimester of Pregnancy The first trimester of pregnancy is from week 1 until the end of week 12 (months 1 through 3). A week after a sperm fertilizes an egg, the egg will implant on the wall of the uterus. This embryo will begin to develop into a baby. Genes from you and your partner are forming the baby. The female genes determine whether the baby is a boy or a girl. At 6-8 weeks, the eyes and face are formed, and the heartbeat can be seen on ultrasound. At the end of 12 weeks, all the baby's organs are formed.  Now that you are pregnant, you will want to do everything you can to have a healthy baby. Two of the most important things are to get good prenatal care and to follow your health care provider's instructions. Prenatal care is all the medical care you receive before the baby's birth. This care will help prevent, find, and treat any problems during the pregnancy and childbirth. BODY CHANGES Your body goes through many changes during pregnancy. The changes vary from woman to woman.   You may gain or lose a couple of pounds at first.  You may feel sick to your stomach (nauseous) and throw up (vomit). If the vomiting is uncontrollable, call your health care provider.  You may tire easily.  You may develop headaches that can be relieved by medicines approved by your health care provider.  You may urinate more often. Painful urination may mean you have a bladder infection.  You may develop heartburn as a result of your pregnancy.  You may develop constipation because certain hormones are causing the muscles that push waste through your intestines to slow down.  You may develop hemorrhoids or swollen, bulging veins (varicose veins).  Your breasts may begin to grow larger and become tender. Your nipples may stick out more, and the tissue that surrounds them (areola) may become darker.  Your gums may bleed and may be sensitive to brushing and flossing.  Dark spots or blotches  (chloasma, mask of pregnancy) may develop on your face. This will likely fade after the baby is born.  Your menstrual periods will stop.  You may have a loss of appetite.  You may develop cravings for certain kinds of food.  You may have changes in your emotions from day to day, such as being excited to be pregnant or being concerned that something may go wrong with the pregnancy and baby.  You may have more vivid and strange dreams.  You may have changes in your hair. These can include thickening of your hair, rapid growth, and changes in texture. Some women also have hair loss during or after pregnancy, or hair that feels dry or thin. Your hair will most likely return to normal after your baby is born. WHAT TO EXPECT AT YOUR PRENATAL VISITS During a routine prenatal visit:  You will be weighed to make sure you and the baby are growing normally.  Your blood pressure will be taken.  Your abdomen will be measured to track your baby's growth.  The fetal heartbeat will be listened to starting around week 10 or 12 of your pregnancy.  Test results from any previous visits will be discussed. Your health care provider may ask you:  How you are feeling.  If you are feeling the baby move.  If you have had any abnormal symptoms, such as leaking fluid, bleeding, severe headaches, or abdominal cramping.  If you are using any tobacco   products, including cigarettes, chewing tobacco, and electronic cigarettes.  If you have any questions. Other tests that may be performed during your first trimester include:  Blood tests to find your blood type and to check for the presence of any previous infections. They will also be used to check for low iron levels (anemia) and Rh antibodies. Later in the pregnancy, blood tests for diabetes will be done along with other tests if problems develop.  Urine tests to check for infections, diabetes, or protein in the urine.  An ultrasound to confirm the  proper growth and development of the baby.  An amniocentesis to check for possible genetic problems.  Fetal screens for spina bifida and Down syndrome.  You may need other tests to make sure you and the baby are doing well.  HIV (human immunodeficiency virus) testing. Routine prenatal testing includes screening for HIV, unless you choose not to have this test. HOME CARE INSTRUCTIONS  Medicines   Follow your health care provider's instructions regarding medicine use. Specific medicines may be either safe or unsafe to take during pregnancy.  Take your prenatal vitamins as directed.  If you develop constipation, try taking a stool softener if your health care provider approves. Diet   Eat regular, well-balanced meals. Choose a variety of foods, such as meat or vegetable-based protein, fish, milk and low-fat dairy products, vegetables, fruits, and whole grain breads and cereals. Your health care provider will help you determine the amount of weight gain that is right for you.  Avoid raw meat and uncooked cheese. These carry germs that can cause birth defects in the baby.  Eating four or five small meals rather than three large meals a day may help relieve nausea and vomiting. If you start to feel nauseous, eating a few soda crackers can be helpful. Drinking liquids between meals instead of during meals also seems to help nausea and vomiting.  If you develop constipation, eat more high-fiber foods, such as fresh vegetables or fruit and whole grains. Drink enough fluids to keep your urine clear or pale yellow. Activity and Exercise   Exercise only as directed by your health care provider. Exercising will help you:  Control your weight.  Stay in shape.  Be prepared for labor and delivery.  Experiencing pain or cramping in the lower abdomen or low back is a good sign that you should stop exercising. Check with your health care provider before continuing normal exercises.  Try to avoid  standing for long periods of time. Move your legs often if you must stand in one place for a long time.  Avoid heavy lifting.  Wear low-heeled shoes, and practice good posture.  You may continue to have sex unless your health care provider directs you otherwise. Relief of Pain or Discomfort   Wear a good support bra for breast tenderness.   Take warm sitz baths to soothe any pain or discomfort caused by hemorrhoids. Use hemorrhoid cream if your health care provider approves.   Rest with your legs elevated if you have leg cramps or low back pain.  If you develop varicose veins in your legs, wear support hose. Elevate your feet for 15 minutes, 3-4 times a day. Limit salt in your diet. Prenatal Care   Schedule your prenatal visits by the twelfth week of pregnancy. They are usually scheduled monthly at first, then more often in the last 2 months before delivery.  Write down your questions. Take them to your prenatal visits.  Keep all your   prenatal visits as directed by your health care provider. Safety   Wear your seat belt at all times when driving.  Make a list of emergency phone numbers, including numbers for family, friends, the hospital, and police and fire departments. General Tips   Ask your health care provider for a referral to a local prenatal education class. Begin classes no later than at the beginning of month 6 of your pregnancy.  Ask for help if you have counseling or nutritional needs during pregnancy. Your health care provider can offer advice or refer you to specialists for help with various needs.  Do not use hot tubs, steam rooms, or saunas.  Do not douche or use tampons or scented sanitary pads.  Do not cross your legs for long periods of time.  Avoid cat litter boxes and soil used by cats. These carry germs that can cause birth defects in the baby and possibly loss of the fetus by miscarriage or stillbirth.  Avoid all smoking, herbs, alcohol, and  medicines not prescribed by your health care provider. Chemicals in these affect the formation and growth of the baby.  Do not use any tobacco products, including cigarettes, chewing tobacco, and electronic cigarettes. If you need help quitting, ask your health care provider. You may receive counseling support and other resources to help you quit.  Schedule a dentist appointment. At home, brush your teeth with a soft toothbrush and be gentle when you floss. SEEK MEDICAL CARE IF:   You have dizziness.  You have mild pelvic cramps, pelvic pressure, or nagging pain in the abdominal area.  You have persistent nausea, vomiting, or diarrhea.  You have a bad smelling vaginal discharge.  You have pain with urination.  You notice increased swelling in your face, hands, legs, or ankles. SEEK IMMEDIATE MEDICAL CARE IF:   You have a fever.  You are leaking fluid from your vagina.  You have spotting or bleeding from your vagina.  You have severe abdominal cramping or pain.  You have rapid weight gain or loss.  You vomit blood or material that looks like coffee grounds.  You are exposed to German measles and have never had them.  You are exposed to fifth disease or chickenpox.  You develop a severe headache.  You have shortness of breath.  You have any kind of trauma, such as from a fall or a car accident. This information is not intended to replace advice given to you by your health care provider. Make sure you discuss any questions you have with your health care provider. Document Released: 02/15/2001 Document Revised: 03/14/2014 Document Reviewed: 01/01/2013 [employer]vier Interactive Patient Education  2017 [employer]  Contraception Choices Contraception (birth control) is the use of any methods or devices to prevent pregnancy. Below are some methods to help avoid pregnancy. Hormonal methods  Contraceptive implant. This is a thin, plastic tube containing progesterone hormone. It  does not contain estrogen hormone. Your health care provider inserts the tube in the inner part of the upper arm. The tube can remain in place for up to 3 years. After 3 years, the implant must be removed. The implant prevents the ovaries from releasing an egg (ovulation), thickens the cervical mucus to prevent sperm from entering the uterus, and thins the lining of the inside of the uterus.  Progesterone-only injections. These injections are given every 3 months by your health care provider to prevent pregnancy. This synthetic progesterone hormone stops the ovaries from releasing eggs. It also thickens cervical   mucus and changes the uterine lining. This makes it harder for sperm to survive in the uterus.  Birth control pills. These pills contain estrogen and progesterone hormone. They work by preventing the ovaries from releasing eggs (ovulation). They also cause the cervical mucus to thicken, preventing the sperm from entering the uterus. Birth control pills are prescribed by a health care provider.Birth control pills can also be used to treat heavy periods.  Minipill. This type of birth control pill contains only the progesterone hormone. They are taken every day of each month and must be prescribed by your health care provider.  Birth control patch. The patch contains hormones similar to those in birth control pills. It must be changed once a week and is prescribed by a health care provider.  Vaginal ring. The ring contains hormones similar to those in birth control pills. It is left in the vagina for 3 weeks, removed for 1 week, and then a new one is put back in place. The patient must be comfortable inserting and removing the ring from the vagina.A health care provider's prescription is necessary.  Emergency contraception. Emergency contraceptives prevent pregnancy after unprotected sexual intercourse. This pill can be taken right after sex or up to 5 days after unprotected sex. It is most  effective the sooner you take the pills after having sexual intercourse. Most emergency contraceptive pills are available without a prescription. Check with your pharmacist. Do not use emergency contraception as your only form of birth control. Barrier methods  Female condom. This is a thin sheath (latex or rubber) that is worn over the penis during sexual intercourse. It can be used with spermicide to increase effectiveness.  Female condom. This is a soft, loose-fitting sheath that is put into the vagina before sexual intercourse.  Diaphragm. This is a soft, latex, dome-shaped barrier that must be fitted by a health care provider. It is inserted into the vagina, along with a spermicidal jelly. It is inserted before intercourse. The diaphragm should be left in the vagina for 6 to 8 hours after intercourse.  Cervical cap. This is a round, soft, latex or plastic cup that fits over the cervix and must be fitted by a health care provider. The cap can be left in place for up to 48 hours after intercourse.  Sponge. This is a soft, circular piece of polyurethane foam. The sponge has spermicide in it. It is inserted into the vagina after wetting it and before sexual intercourse.  Spermicides. These are chemicals that kill or block sperm from entering the cervix and uterus. They come in the form of creams, jellies, suppositories, foam, or tablets. They do not require a prescription. They are inserted into the vagina with an applicator before having sexual intercourse. The process must be repeated every time you have sexual intercourse. Intrauterine contraception  Intrauterine device (IUD). This is a T-shaped device that is put in a woman's uterus during a menstrual period to prevent pregnancy. There are 2 types:  Copper IUD. This type of IUD is wrapped in copper wire and is placed inside the uterus. Copper makes the uterus and fallopian tubes produce a fluid that kills sperm. It can stay in place for 10  years.  Hormone IUD. This type of IUD contains the hormone progestin (synthetic progesterone). The hormone thickens the cervical mucus and prevents sperm from entering the uterus, and it also thins the uterine lining to prevent implantation of a fertilized egg. The hormone can weaken or kill the sperm that   get into the uterus. It can stay in place for 3-5 years, depending on which type of IUD is used. Permanent methods of contraception  Female tubal ligation. This is when the woman's fallopian tubes are surgically sealed, tied, or blocked to prevent the egg from traveling to the uterus.  Hysteroscopic sterilization. This involves placing a small coil or insert into each fallopian tube. Your doctor uses a technique called hysteroscopy to do the procedure. The device causes scar tissue to form. This results in permanent blockage of the fallopian tubes, so the sperm cannot fertilize the egg. It takes about 3 months after the procedure for the tubes to become blocked. You must use another form of birth control for these 3 months.  Female sterilization. This is when the female has the tubes that carry sperm tied off (vasectomy).This blocks sperm from entering the vagina during sexual intercourse. After the procedure, the man can still ejaculate fluid (semen). Natural planning methods  Natural family planning. This is not having sexual intercourse or using a barrier method (condom, diaphragm, cervical cap) on days the woman could become pregnant.  Calendar method. This is keeping track of the length of each menstrual cycle and identifying when you are fertile.  Ovulation method. This is avoiding sexual intercourse during ovulation.  Symptothermal method. This is avoiding sexual intercourse during ovulation, using a thermometer and ovulation symptoms.  Post-ovulation method. This is timing sexual intercourse after you have ovulated. Regardless of which type or method of contraception you choose, it is  important that you use condoms to protect against the transmission of sexually transmitted infections (STIs). Talk with your health care provider about which form of contraception is most appropriate for you. This information is not intended to replace advice given to you by your health care provider. Make sure you discuss any questions you have with your health care provider. Document Released: 02/21/2005 Document Revised: 07/30/2015 Document Reviewed: 08/16/2012 Elsevier Interactive Patient Education  2017 [employer]   Breastfeeding Deciding to breastfeed is one of the best choices you can make for you and your baby. A change in hormones during pregnancy causes your breast tissue to grow and increases the number and size of your milk ducts. These hormones also allow proteins, sugars, and fats from your blood supply to make breast milk in your milk-producing glands. Hormones prevent breast milk from being released before your baby is born as well as prompt milk flow after birth. Once breastfeeding has begun, thoughts of your baby, as well as his or her sucking or crying, can stimulate the release of milk from your milk-producing glands. Benefits of breastfeeding For Your Baby  Your first milk (colostrum) helps your baby's digestive system function better.  There are antibodies in your milk that help your baby fight off infections.  Your baby has a lower incidence of asthma, allergies, and sudden infant death syndrome.  The nutrients in breast milk are better for your baby than infant formulas and are designed uniquely for your baby's needs.  Breast milk improves your baby's brain development.  Your baby is less likely to develop other conditions, such as childhood obesity, asthma, or type 2 diabetes mellitus. For You  Breastfeeding helps to create a very special bond between you and your baby.  Breastfeeding is convenient. Breast milk is always available at the correct temperature and  costs nothing.  Breastfeeding helps to burn calories and helps you lose the weight gained during pregnancy.  Breastfeeding makes your uterus contract to its   prepregnancy size faster and slows bleeding (lochia) after you give birth.  Breastfeeding helps to lower your risk of developing type 2 diabetes mellitus, osteoporosis, and breast or ovarian cancer later in life. Signs that your baby is hungry Early Signs of Hunger  Increased alertness or activity.  Stretching.  Movement of the head from side to side.  Movement of the head and opening of the mouth when the corner of the mouth or cheek is stroked (rooting).  Increased sucking sounds, smacking lips, cooing, sighing, or squeaking.  Hand-to-mouth movements.  Increased sucking of fingers or hands. Late Signs of Hunger  Fussing.  Intermittent crying. Extreme Signs of Hunger  Signs of extreme hunger will require calming and consoling before your baby will be able to breastfeed successfully. Do not wait for the following signs of extreme hunger to occur before you initiate breastfeeding:  Restlessness.  A loud, strong cry.  Screaming. Breastfeeding basics  Breastfeeding Initiation  Find a comfortable place to sit or lie down, with your neck and back well supported.  Place a pillow or rolled up blanket under your baby to bring him or her to the level of your breast (if you are seated). Nursing pillows are specially designed to help support your arms and your baby while you breastfeed.  Make sure that your baby's abdomen is facing your abdomen.  Gently massage your breast. With your fingertips, massage from your chest wall toward your nipple in a circular motion. This encourages milk flow. You may need to continue this action during the feeding if your milk flows slowly.  Support your breast with 4 fingers underneath and your thumb above your nipple. Make sure your fingers are well away from your nipple and your baby's  mouth.  Stroke your baby's lips gently with your finger or nipple.  When your baby's mouth is open wide enough, quickly bring your baby to your breast, placing your entire nipple and as much of the colored area around your nipple (areola) as possible into your baby's mouth.  More areola should be visible above your baby's upper lip than below the lower lip.  Your baby's tongue should be between his or her lower gum and your breast.  Ensure that your baby's mouth is correctly positioned around your nipple (latched). Your baby's lips should create a seal on your breast and be turned out (everted).  It is common for your baby to suck about 2-3 minutes in order to start the flow of breast milk. Latching  Teaching your baby how to latch on to your breast properly is very important. An improper latch can cause nipple pain and decreased milk supply for you and poor weight gain in your baby. Also, if your baby is not latched onto your nipple properly, he or she may swallow some air during feeding. This can make your baby fussy. Burping your baby when you switch breasts during the feeding can help to get rid of the air. However, teaching your baby to latch on properly is still the best way to prevent fussiness from swallowing air while breastfeeding. Signs that your baby has successfully latched on to your nipple:  Silent tugging or silent sucking, without causing you pain.  Swallowing heard between every 3-4 sucks.  Muscle movement above and in front of his or her ears while sucking. Signs that your baby has not successfully latched on to nipple:  Sucking sounds or smacking sounds from your baby while breastfeeding.  Nipple pain. If you think your   baby has not latched on correctly, slip your finger into the corner of your baby's mouth to break the suction and place it between your baby's gums. Attempt breastfeeding initiation again. Signs of Successful Breastfeeding  Signs from your baby:  A  gradual decrease in the number of sucks or complete cessation of sucking.  Falling asleep.  Relaxation of his or her body.  Retention of a small amount of milk in his or her mouth.  Letting go of your breast by himself or herself. Signs from you:  Breasts that have increased in firmness, weight, and size 1-3 hours after feeding.  Breasts that are softer immediately after breastfeeding.  Increased milk volume, as well as a change in milk consistency and color by the fifth day of breastfeeding.  Nipples that are not sore, cracked, or bleeding. Signs That Your Baby is Getting Enough Milk  Wetting at least 1-2 diapers during the first 24 hours after birth.  Wetting at least 5-6 diapers every 24 hours for the first week after birth. The urine should be clear or pale yellow by 5 days after birth.  Wetting 6-8 diapers every 24 hours as your baby continues to grow and develop.  At least 3 stools in a 24-hour period by age 5 days. The stool should be soft and yellow.  At least 3 stools in a 24-hour period by age 7 days. The stool should be seedy and yellow.  No loss of weight greater than 10% of birth weight during the first 3 days of age.  Average weight gain of 4-7 ounces (113-198 g) per week after age 4 days.  Consistent daily weight gain by age 5 days, without weight loss after the age of 2 weeks. After a feeding, your baby may spit up a small amount. This is common. Breastfeeding frequency and duration Frequent feeding will help you make more milk and can prevent sore nipples and breast engorgement. Breastfeed when you feel the need to reduce the fullness of your breasts or when your baby shows signs of hunger. This is called "breastfeeding on demand." Avoid introducing a pacifier to your baby while you are working to establish breastfeeding (the first 4-6 weeks after your baby is born). After this time you may choose to use a pacifier. Research has shown that pacifier use during the  first year of a baby's life decreases the risk of sudden infant death syndrome (SIDS). Allow your baby to feed on each breast as long as he or she wants. Breastfeed until your baby is finished feeding. When your baby unlatches or falls asleep while feeding from the first breast, offer the second breast. Because newborns are often sleepy in the first few weeks of life, you may need to awaken your baby to get him or her to feed. Breastfeeding times will vary from baby to baby. However, the following rules can serve as a guide to help you ensure that your baby is properly fed:  Newborns (babies 4 weeks of age or younger) may breastfeed every 1-3 hours.  Newborns should not go longer than 3 hours during the day or 5 hours during the night without breastfeeding.  You should breastfeed your baby a minimum of 8 times in a 24-hour period until you begin to introduce solid foods to your baby at around 6 months of age. Breast milk pumping Pumping and storing breast milk allows you to ensure that your baby is exclusively fed your breast milk, even at times when you are unable   to breastfeed. This is especially important if you are going back to work while you are still breastfeeding or when you are not able to be present during feedings. Your lactation consultant can give you guidelines on how long it is safe to store breast milk. A breast pump is a machine that allows you to pump milk from your breast into a sterile bottle. The pumped breast milk can then be stored in a refrigerator or freezer. Some breast pumps are operated by hand, while others use electricity. Ask your lactation consultant which type will work best for you. Breast pumps can be purchased, but some hospitals and breastfeeding support groups lease breast pumps on a monthly basis. A lactation consultant can teach you how to hand express breast milk, if you prefer not to use a pump. Caring for your breasts while you breastfeed Nipples can become  dry, cracked, and sore while breastfeeding. The following recommendations can help keep your breasts moisturized and healthy:  Avoid using soap on your nipples.  Wear a supportive bra. Although not required, special nursing bras and tank tops are designed to allow access to your breasts for breastfeeding without taking off your entire bra or top. Avoid wearing underwire-style bras or extremely tight bras.  Air dry your nipples for 3-4minutes after each feeding.  Use only cotton bra pads to absorb leaked breast milk. Leaking of breast milk between feedings is normal.  Use lanolin on your nipples after breastfeeding. Lanolin helps to maintain your skin's normal moisture barrier. If you use pure lanolin, you do not need to wash it off before feeding your baby again. Pure lanolin is not toxic to your baby. You may also hand express a few drops of breast milk and gently massage that milk into your nipples and allow the milk to air dry. In the first few weeks after giving birth, some women experience extremely full breasts (engorgement). Engorgement can make your breasts feel heavy, warm, and tender to the touch. Engorgement peaks within 3-5 days after you give birth. The following recommendations can help ease engorgement:  Completely empty your breasts while breastfeeding or pumping. You may want to start by applying warm, moist heat (in the shower or with warm water-soaked hand towels) just before feeding or pumping. This increases circulation and helps the milk flow. If your baby does not completely empty your breasts while breastfeeding, pump any extra milk after he or she is finished.  Wear a snug bra (nursing or regular) or tank top for 1-2 days to signal your body to slightly decrease milk production.  Apply ice packs to your breasts, unless this is too uncomfortable for you.  Make sure that your baby is latched on and positioned properly while breastfeeding. If engorgement persists after 48  hours of following these recommendations, contact your health care provider or a lactation consultant. Overall health care recommendations while breastfeeding  Eat healthy foods. Alternate between meals and snacks, eating 3 of each per day. Because what you eat affects your breast milk, some of the foods may make your baby more irritable than usual. Avoid eating these foods if you are sure that they are negatively affecting your baby.  Drink milk, fruit juice, and water to satisfy your thirst (about 10 glasses a day).  Rest often, relax, and continue to take your prenatal vitamins to prevent fatigue, stress, and anemia.  Continue breast self-awareness checks.  Avoid chewing and smoking tobacco. Chemicals from cigarettes that pass into breast milk and exposure to   secondhand smoke may harm your baby.  Avoid alcohol and drug use, including marijuana. Some medicines that may be harmful to your baby can pass through breast milk. It is important to ask your health care provider before taking any medicine, including all over-the-counter and prescription medicine as well as vitamin and herbal supplements. It is possible to become pregnant while breastfeeding. If birth control is desired, ask your health care provider about options that will be safe for your baby. Contact a health care provider if:  You feel like you want to stop breastfeeding or have become frustrated with breastfeeding.  You have painful breasts or nipples.  Your nipples are cracked or bleeding.  Your breasts are red, tender, or warm.  You have a swollen area on either breast.  You have a fever or chills.  You have nausea or vomiting.  You have drainage other than breast milk from your nipples.  Your breasts do not become full before feedings by the fifth day after you give birth.  You feel sad and depressed.  Your baby is too sleepy to eat well.  Your baby is having trouble sleeping.  Your baby is wetting less  than 3 diapers in a 24-hour period.  Your baby has less than 3 stools in a 24-hour period.  Your baby's skin or the white part of his or her eyes becomes yellow.  Your baby is not gaining weight by 5 days of age. Get help right away if:  Your baby is overly tired (lethargic) and does not want to wake up and feed.  Your baby develops an unexplained fever. This information is not intended to replace advice given to you by your health care provider. Make sure you discuss any questions you have with your health care provider. Document Released: 02/21/2005 Document Revised: 08/05/2015 Document Reviewed: 08/15/2012 Elsevier Interactive Patient Education  2017 [employer]  

## 2016-03-02 LAB — GC/CHLAMYDIA PROBE AMP (~~LOC~~) NOT AT ARMC
Chlamydia: NEGATIVE
NEISSERIA GONORRHEA: NEGATIVE

## 2016-03-03 LAB — CULTURE, OB URINE

## 2016-03-03 LAB — URINE CULTURE, OB REFLEX

## 2016-03-07 NOTE — L&D Delivery Note (Signed)
Delivery Note At 3:49 AM a viable female was delivered via Vaginal, Spontaneous Delivery (Presentation: OA to ROA ; nuchal cord and cord around the abdomen, delivered though).  APGAR: 7, 9; weight 7 lb 10.1 oz (3460 g).   Infant placed immediately on maternal abdomen and stimulated and suctioned.  Placenta status: delivered intact with gentle traction.  Cord:  with the following complications: .  Cord pH: not collected  Anesthesia:   Episiotomy: None Lacerations:  2nd degree repaired with 3-0 vicryl Suture Repair: 3.0 vicryl Est. Blood Loss (mL):  300  Mom to postpartum.  Baby to Couplet care / Skin to Skin.  Alyssa Goodman 09/28/2016, 4:25 AM

## 2016-03-08 LAB — HEMOGLOBINOPATHY EVALUATION
HEMOGLOBIN A2 QUANTITATION: 2.2 % (ref 1.8–3.2)
HGB A: 97.8 % (ref 96.4–98.8)
HGB C: 0 %
HGB S: 0 %
HGB VARIANT: 0 %
Hemoglobin F Quantitation: 0 % (ref 0.0–2.0)

## 2016-03-08 LAB — OBSTETRIC PANEL, INCLUDING HIV
ANTIBODY SCREEN: NEGATIVE
BASOS: 0 %
Basophils Absolute: 0 10*3/uL (ref 0.0–0.2)
EOS (ABSOLUTE): 0.1 10*3/uL (ref 0.0–0.4)
EOS: 1 %
HEMATOCRIT: 37.9 % (ref 34.0–46.6)
HEMOGLOBIN: 12.4 g/dL (ref 11.1–15.9)
HEP B S AG: NEGATIVE
HIV Screen 4th Generation wRfx: NONREACTIVE
IMMATURE GRANS (ABS): 0 10*3/uL (ref 0.0–0.1)
Immature Granulocytes: 0 %
LYMPHS: 19 %
Lymphocytes Absolute: 2.2 10*3/uL (ref 0.7–3.1)
MCH: 27 pg (ref 26.6–33.0)
MCHC: 32.7 g/dL (ref 31.5–35.7)
MCV: 83 fL (ref 79–97)
MONOCYTES: 5 %
Monocytes Absolute: 0.6 10*3/uL (ref 0.1–0.9)
NEUTROS PCT: 75 %
Neutrophils Absolute: 8.4 10*3/uL — ABNORMAL HIGH (ref 1.4–7.0)
Platelets: 310 10*3/uL (ref 150–379)
RBC: 4.59 x10E6/uL (ref 3.77–5.28)
RDW: 15.3 % (ref 12.3–15.4)
RH TYPE: POSITIVE
RPR: NONREACTIVE
RUBELLA: 4.71 {index} (ref >0.99)
WBC: 11.3 10*3/uL — ABNORMAL HIGH (ref 3.4–10.8)

## 2016-03-08 LAB — CYSTIC FIBROSIS MUTATION 97: GENE DIS ANAL CARRIER INTERP BLD/T-IMP: NOT DETECTED

## 2016-03-08 LAB — HEMOGLOBIN A1C
Est. average glucose Bld gHb Est-mCnc: 114 mg/dL
Hgb A1c MFr Bld: 5.6 % (ref 4.8–5.6)

## 2016-03-08 LAB — VARICELLA ZOSTER ANTIBODY, IGG: Varicella zoster IgG: 2022 index (ref >165)

## 2016-03-09 LAB — TOXASSURE SELECT 13 (MW), URINE

## 2016-03-14 ENCOUNTER — Encounter (HOSPITAL_COMMUNITY): Payer: Self-pay | Admitting: Obstetrics and Gynecology

## 2016-03-18 ENCOUNTER — Inpatient Hospital Stay (HOSPITAL_COMMUNITY)
Admission: AD | Admit: 2016-03-18 | Discharge: 2016-03-19 | Disposition: A | Discharge status: Home / Self Care | Payer: Medicaid Other | Source: Ambulatory Visit | Attending: Obstetrics & Gynecology | Admitting: Obstetrics & Gynecology

## 2016-03-18 DIAGNOSIS — O23591 Infection of other part of genital tract in pregnancy, first trimester: Secondary | ICD-10-CM | POA: Insufficient documentation

## 2016-03-18 DIAGNOSIS — N760 Acute vaginitis: Secondary | ICD-10-CM | POA: Diagnosis not present

## 2016-03-18 DIAGNOSIS — Z3A12 12 weeks gestation of pregnancy: Secondary | ICD-10-CM | POA: Insufficient documentation

## 2016-03-18 DIAGNOSIS — B9689 Other specified bacterial agents as the cause of diseases classified elsewhere: Secondary | ICD-10-CM | POA: Insufficient documentation

## 2016-03-18 DIAGNOSIS — N898 Other specified noninflammatory disorders of vagina: Secondary | ICD-10-CM | POA: Diagnosis present

## 2016-03-18 DIAGNOSIS — O99211 Obesity complicating pregnancy, first trimester: Secondary | ICD-10-CM | POA: Insufficient documentation

## 2016-03-18 DIAGNOSIS — O99511 Diseases of the respiratory system complicating pregnancy, first trimester: Secondary | ICD-10-CM | POA: Insufficient documentation

## 2016-03-18 DIAGNOSIS — R300 Dysuria: Secondary | ICD-10-CM | POA: Insufficient documentation

## 2016-03-18 DIAGNOSIS — O2341 Unspecified infection of urinary tract in pregnancy, first trimester: Secondary | ICD-10-CM | POA: Diagnosis not present

## 2016-03-18 DIAGNOSIS — Z87891 Personal history of nicotine dependence: Secondary | ICD-10-CM | POA: Diagnosis not present

## 2016-03-18 NOTE — MAU Note (Signed)
Hurts when I pee tonight. Had pressure after I peed. Some sharp pain in L lower abd and my L side of back is stiff. No bleeding. Some white d/c with odor.

## 2016-03-19 ENCOUNTER — Encounter (HOSPITAL_COMMUNITY): Payer: Self-pay | Admitting: *Deleted

## 2016-03-19 DIAGNOSIS — O2341 Unspecified infection of urinary tract in pregnancy, first trimester: Secondary | ICD-10-CM

## 2016-03-19 DIAGNOSIS — Z3A12 12 weeks gestation of pregnancy: Secondary | ICD-10-CM

## 2016-03-19 LAB — URINALYSIS, ROUTINE W REFLEX MICROSCOPIC
Bilirubin Urine: NEGATIVE
Glucose, UA: NEGATIVE mg/dL
Ketones, ur: NEGATIVE mg/dL
Nitrite: NEGATIVE
Protein, ur: 30 mg/dL — AB
Specific Gravity, Urine: 1.026 (ref 1.005–1.030)
pH: 5 (ref 5.0–8.0)

## 2016-03-19 LAB — WET PREP, GENITAL
SPERM: NONE SEEN
Trich, Wet Prep: NONE SEEN
YEAST WET PREP: NONE SEEN

## 2016-03-19 MED ORDER — CEPHALEXIN 500 MG PO CAPS: 500.0000 mg | ORAL_CAPSULE | Freq: Four times a day (QID) | ORAL | 0 refills | Status: DC

## 2016-03-19 MED ORDER — CEPHALEXIN 500 MG PO CAPS
500.0000 mg | ORAL_CAPSULE | Freq: Once | ORAL | Status: AC
  Administered 2016-03-19: 500 mg via ORAL
  Filled 2016-03-19: qty 1

## 2016-03-19 MED ORDER — PHENAZOPYRIDINE HCL 200 MG PO TABS: 200.0000 mg | ORAL_TABLET | Freq: Three times a day (TID) | ORAL | 0 refills | Status: DC

## 2016-03-19 MED ORDER — CLINDAMYCIN HCL 300 MG PO CAPS: 300.0000 mg | ORAL_CAPSULE | Freq: Two times a day (BID) | ORAL | 0 refills | Status: DC

## 2016-03-19 MED ORDER — PHENAZOPYRIDINE HCL 100 MG PO TABS
100.0000 mg | ORAL_TABLET | Freq: Once | ORAL | Status: AC
  Administered 2016-03-19: 100 mg via ORAL
  Filled 2016-03-19: qty 1

## 2016-03-19 NOTE — Discharge Instructions (Signed)
Pregnancy and Urinary Tract Infection °WHAT IS A URINARY TRACT INFECTION? °A urinary tract infection (UTI) is an infection of any part of the urinary tract. This includes the kidneys, the tubes that connect your kidneys to your bladder (ureters), the bladder, and the tube that carries urine out of your body (urethra). These organs make, store, and get rid of urine in the body. A UTI can be a bladder infection (cystitis) or a kidney infection (pyelonephritis). This infection may be caused by fungi, viruses, and bacteria. Bacteria are the most common cause of UTIs. °You are more likely to develop a UTI during pregnancy because: °· The physical and hormonal changes your body goes through can make it easier for bacteria to get into your urinary tract. °· Your growing baby puts pressure on your uterus and can affect urine flow. °DOES A UTI PLACE MY BABY AT RISK? °An untreated UTI during pregnancy could lead to a kidney infection, which can cause health problems that could affect your baby. Possible complications of an untreated UTI include: °· Having your baby before 37 weeks of pregnancy (premature). °· Having a baby with a low birth weight. °· Developing high blood pressure during pregnancy (preeclampsia). °WHAT ARE THE SYMPTOMS OF A UTI? °Symptoms of a UTI include: °· Fever. °· Frequent urination or passing small amounts of urine frequently. °· Needing to urinate urgently. °· Pain or a burning sensation with urination. °· Urine that smells bad or unusual. °· Cloudy urine. °· Pain in the lower abdomen or back. °· Trouble urinating. °· Blood in the urine. °· Vomiting or being less hungry than normal. °· Diarrhea or abdominal pain. °· Vaginal discharge. °WHAT ARE THE TREATMENT OPTIONS FOR A UTI DURING PREGNANCY? °Treatment for this condition may include: °· Antibiotic medicines that are safe to take during pregnancy. °· Other medicines to treat less common causes of UTI. °HOW CAN I PREVENT A UTI? °To prevent a UTI: °· Go  to the bathroom as soon as you feel the need. °· Always wipe from front to back. °· Wash your genital area with soap and warm water daily. °· Empty your bladder before and after sex. °· Wear cotton underwear. °· Limit your intake of high sugar foods or drinks, such as regular soda, juice, and sweets.. °· Drink 6-8 glasses of water daily. °· Do not wear tight-fitting pants. °· Do not douche or use deodorant sprays. °· Do not drink alcohol, caffeine, or carbonated drinks. These can irritate the bladder. °WHEN SHOULD I SEEK MEDICAL CARE? °Seek medical care if: °· Your symptoms do not improve or get worse. °· You have a fever after two days of treatment. °· You have a rash. °· You have abnormal vaginal discharge. °· You have back or side pain. °· You have chills. °· You have nausea and vomiting. °WHEN SHOULD I SEEK IMMEDIATE MEDICAL CARE? °Seek immediate medical care if you are pregnant and: °· You feel contractions in your uterus. °· You have lower belly pain. °· You have a gush of fluid from your vagina. °· You have blood in your urine. °· You are vomiting and cannot keep down any medicines or water. °This information is not intended to replace advice given to you by your health care provider. Make sure you discuss any questions you have with your health care provider. °Document Released: 06/18/2010 Document Revised: 07/27/2015 Document Reviewed: 01/12/2015 °Elsevier Interactive Patient Education © 2017 [employer] ° °Bacterial Vaginosis °Bacterial vaginosis is an infection of the vagina. It happens   when too many germs (bacteria) grow in the vagina. This infection puts you at risk for infections from sex (STIs). Treating this infection can lower your risk for some STIs. You should also treat this if you are pregnant. It can cause your baby to be born early. °Follow these instructions at home: °Medicines °· Take over-the-counter and prescription medicines only as told by your doctor. °· Take or use your antibiotic  medicine as told by your doctor. Do not stop taking or using it even if you start to feel better. °General instructions °· If you your sexual partner is a woman, tell her that you have this infection. She needs to get treatment if she has symptoms. If you have a female partner, he does not need to be treated. °· During treatment: °¨ Avoid sex. °¨ Do not douche. °¨ Avoid alcohol as told. °¨ Avoid breastfeeding as told. °· Drink enough fluid to keep your pee (urine) clear or pale yellow. °· Keep your vagina and butt (rectum) clean. °¨ Wash the area with warm water every day. °¨ Wipe from front to back after you use the toilet. °· Keep all follow-up visits as told by your doctor. This is important. °Preventing this condition °· Do not douche. °· Use only warm water to wash around your vagina. °· Use protection when you have sex. This includes: °¨ Latex condoms. °¨ Dental dams. °· Limit how many people you have sex with. It is best to only have sex with the same person (be monogamous). °· Get tested for STIs. Have your partner get tested. °· Wear underwear that is cotton or lined with cotton. °· Avoid tight pants and pantyhose. This is most important in summer. °· Do not use any products that have nicotine or tobacco in them. These include cigarettes and e-cigarettes. If you need help quitting, ask your doctor. °· Do not use illegal drugs. °· Limit how much alcohol you drink. °Contact a doctor if: °· Your symptoms do not get better, even after you are treated. °· You have more discharge or pain when you pee (urinate). °· You have a fever. °· You have pain in your belly (abdomen). °· You have pain with sex. °· Your bleed from your vagina between periods. °Summary °· This infection happens when too many germs (bacteria) grow in the vagina. °· Treating this condition can lower your risk for some infections from sex (STIs). °· You should also treat this if you are pregnant. It can cause early (premature) birth. °· Do not stop  taking or using your antibiotic medicine even if you start to feel better. °This information is not intended to replace advice given to you by your health care provider. Make sure you discuss any questions you have with your health care provider. °Document Released: 12/01/2007 Document Revised: 11/07/2015 Document Reviewed: 11/07/2015 °Elsevier Interactive Patient Education © 2017 [employer] Inc. ° °

## 2016-03-19 NOTE — MAU Provider Note (Signed)
History     CSN: [id number]  Arrival date and time: 03/18/16 2323   First Provider Initiated Contact with Patient 03/19/16 0032      Chief Complaint  Patient presents with  . Dysuria  . Vaginal Discharge   HPI Dossie ArbourBianca L Awwad is a 26 y.o. G1P0 at [id number] who presents with dysuria & vaginal discharge. Symptoms began this morning. Reports dysuria with every urination today. Associated symptoms include pelvic pressure, lower abdominal cramping, & urinary urgency. Denies vomiting, diarrhea, constipation, hematuria, fever, or flank pain. Endorses nausea throughout the pregnancy; no change. Also has some foul smelling discharge. No itching or irritation.   OB History    Gravida Para Term Preterm AB Living   1             SAB TAB Ectopic Multiple Live Births                  Past Medical History:  Diagnosis Date  . Asthma   . Foul smelling vaginal discharge 06/27/2012   BV, treated 05/08/14 Trichomonas and BV, most recently treated on 06/28/12. Trichomonas treated on 04/16/13.   . Obesity     History reviewed. No pertinent surgical history.  Family History  Problem Relation Age of Onset  . Hypertension Mother   . Diabetes Maternal Uncle   . Hypertension Maternal Uncle   . Diabetes Maternal Grandmother   . Hypertension Maternal Grandmother   . Heart disease Maternal Grandmother   . Cancer Maternal Grandfather     colon    Social History  Substance Use Topics  . Smoking status: Former Smoker    Types: Cigarettes  . Smokeless tobacco: Never Used     Comment: stopped with +UPT  . Alcohol use No    Allergies:  Allergies  Allergen Reactions  . Shellfish Allergy     Mostly oysters and limited to number of shrimp.  . Metronidazole Rash    Prescriptions Prior to Admission  Medication Sig Dispense Refill Last Dose  . Prenatal MV-Min-FA-Omega-3 (PRENATAL GUMMIES/DHA & FA) 0.4-32.5 MG CHEW Chew 1 tablet by mouth daily. 30 tablet 11 03/18/2016 at Unknown time  . promethazine  (PHENERGAN) 25 MG tablet Take 0.5-1 tablets (12.5-25 mg total) by mouth every 6 (six) hours as needed. 30 tablet 0 Past Week at Unknown time  . acyclovir (ZOVIRAX) 400 MG tablet Take 1 tablet (400 mg total) by mouth 3 (three) times daily. for 5 days at the earliest signs of an outbreak. (Patient not taking: Reported on 03/01/2016) 15 tablet 3 Not Taking  . albuterol (PROVENTIL HFA;VENTOLIN HFA) 108 (90 BASE) MCG/ACT inhaler Inhale 2 puffs into the lungs every 6 (six) hours as needed for wheezing.   Taking  . clindamycin (CLEOCIN) 300 MG capsule Take 1 capsule (300 mg total) by mouth 2 (two) times daily. (Patient not taking: Reported on 03/01/2016) 14 capsule 0 Not Taking  . cyclobenzaprine (FLEXERIL) 10 MG tablet Take 1 tablet (10 mg total) by mouth 2 (two) times daily as needed for muscle spasms. (Patient not taking: Reported on 03/01/2016) 20 tablet 0 Not Taking  . meloxicam (MOBIC) 7.5 MG tablet Take 1 tablet (7.5 mg total) by mouth daily. (Patient not taking: Reported on 03/01/2016) 14 tablet 0 Not Taking  . traMADol (ULTRAM) 50 MG tablet Take 1 tablet (50 mg total) by mouth every 6 (six) hours as needed. (Patient not taking: Reported on 03/01/2016) 15 tablet 0 Not Taking    Review of Systems  Constitutional: Negative  for chills and fever.  Gastrointestinal: Positive for abdominal pain and nausea. Negative for constipation, diarrhea and vomiting.  Genitourinary: Positive for dysuria, urgency and vaginal discharge. Negative for flank pain, frequency, hematuria, vaginal bleeding and vaginal pain.   Physical Exam   Blood pressure 133/72, pulse 83, temperature 98 F (36.7 C), resp. rate 18, height 5\' 1"  (1.549 m), weight 299 lb 3.2 oz (135.7 kg), last menstrual period 12/22/2015.  Physical Exam  Nursing note and vitals reviewed. Constitutional: She is oriented to person, place, and time. She appears well-developed and well-nourished. No distress.  HENT:  Head: Normocephalic and atraumatic.   Eyes: Conjunctivae are normal. Right eye exhibits no discharge. Left eye exhibits no discharge. No scleral icterus.  Neck: Normal range of motion.  Cardiovascular: Normal rate, regular rhythm and normal heart sounds.   No murmur heard. Respiratory: Effort normal and breath sounds normal. No respiratory distress. She has no wheezes.  GI: Soft. Bowel sounds are normal. She exhibits no distension. There is no tenderness.  Neurological: She is alert and oriented to person, place, and time.  Skin: Skin is warm and dry. She is not diaphoretic.  Psychiatric: She has a normal mood and affect. Her behavior is normal. Judgment and thought content normal.    MAU Course  Procedures Results for orders placed or performed during the hospital encounter of 03/18/16 (from the past 24 hour(s))  Urinalysis, Routine w reflex microscopic     Status: Abnormal   Collection Time: 03/18/16 11:40 PM  Result Value Ref Range   Color, Urine YELLOW YELLOW   APPearance CLOUDY (A) CLEAR   Specific Gravity, Urine 1.026 1.005 - 1.030   pH 5.0 5.0 - 8.0   Glucose, UA NEGATIVE NEGATIVE mg/dL   Hgb urine dipstick SMALL (A) NEGATIVE   Bilirubin Urine NEGATIVE NEGATIVE   Ketones, ur NEGATIVE NEGATIVE mg/dL   Protein, ur 30 (A) NEGATIVE mg/dL   Nitrite NEGATIVE NEGATIVE   Leukocytes, UA LARGE (A) NEGATIVE   RBC / HPF TOO NUMEROUS TO COUNT 0 - 5 RBC/hpf   WBC, UA TOO NUMEROUS TO COUNT 0 - 5 WBC/hpf   Bacteria, UA FEW (A) NONE SEEN   Squamous Epithelial / LPF 6-30 (A) NONE SEEN   Mucous PRESENT   Wet prep, genital     Status: Abnormal   Collection Time: 03/19/16 12:45 AM  Result Value Ref Range   Yeast Wet Prep HPF POC NONE SEEN NONE SEEN   Trich, Wet Prep NONE SEEN NONE SEEN   Clue Cells Wet Prep HPF POC PRESENT (A) NONE SEEN   WBC, Wet Prep HPF POC TOO NUMEROUS TO COUNT (A) NONE SEEN   Sperm NONE SEEN     MDM FHT 145 by doppler Wet prep U/a -- urine culture sent -- will tx for uti based on symptoms First  dose of abx & pyridium given in MAU per pt request  Assessment and Plan  A: 1. UTI (urinary tract infection) during pregnancy, first trimester   2. BV (bacterial vaginosis)    P: Discharge home Rx clindamycin for BV d/t pt allergy to metronidazole Rx keflex & pyridium Urine culture pending Discussed reasons to return to MAU Keep f/u with OB  Judeth Horn 03/19/2016, 12:26 AM

## 2016-03-19 NOTE — Progress Notes (Signed)
Erin Lawrence NP in earlier to discuss test results and d/c plan. Written and verbal d/c instructions given and understanding voiced 

## 2016-03-20 LAB — CULTURE, OB URINE

## 2016-03-24 ENCOUNTER — Ambulatory Visit (HOSPITAL_COMMUNITY): Admission: RE | Admit: 2016-03-24 | Payer: Medicaid Other | Source: Ambulatory Visit

## 2016-03-24 ENCOUNTER — Ambulatory Visit (HOSPITAL_COMMUNITY): Payer: Medicaid Other

## 2016-03-29 ENCOUNTER — Encounter: Payer: Self-pay | Admitting: Certified Nurse Midwife

## 2016-03-29 ENCOUNTER — Ambulatory Visit (INDEPENDENT_AMBULATORY_CARE_PROVIDER_SITE_OTHER): Payer: Medicaid Other | Admitting: Obstetrics and Gynecology

## 2016-03-29 VITALS — BP 101/68 | HR 94 | Wt 296.0 lb

## 2016-03-29 DIAGNOSIS — E669 Obesity, unspecified: Secondary | ICD-10-CM

## 2016-03-29 DIAGNOSIS — A609 Anogenital herpesviral infection, unspecified: Secondary | ICD-10-CM

## 2016-03-29 DIAGNOSIS — Z3490 Encounter for supervision of normal pregnancy, unspecified, unspecified trimester: Secondary | ICD-10-CM

## 2016-03-29 DIAGNOSIS — A6009 Herpesviral infection of other urogenital tract: Secondary | ICD-10-CM

## 2016-03-29 DIAGNOSIS — O99212 Obesity complicating pregnancy, second trimester: Secondary | ICD-10-CM

## 2016-03-29 DIAGNOSIS — O98312 Other infections with a predominantly sexual mode of transmission complicating pregnancy, second trimester: Secondary | ICD-10-CM

## 2016-03-29 NOTE — Patient Instructions (Signed)
Second Trimester of Pregnancy The second trimester is from week 13 through week 28 (months 4 through 6). The second trimester is often a time when you feel your best. Your body has also adjusted to being pregnant, and you begin to feel better physically. Usually, morning sickness has lessened or quit completely, you may have more energy, and you may have an increase in appetite. The second trimester is also a time when the fetus is growing rapidly. At the end of the sixth month, the fetus is about 9 inches long and weighs about 1 pounds. You will likely begin to feel the baby move (quickening) between 18 and 20 weeks of the pregnancy. Body changes during your second trimester Your body continues to go through many changes during your second trimester. The changes vary from woman to woman.  Your weight will continue to increase. You will notice your lower abdomen bulging out.  You may begin to get stretch marks on your hips, abdomen, and breasts.  You may develop headaches that can be relieved by medicines. The medicines should be approved by your health care provider.  You may urinate more often because the fetus is pressing on your bladder.  You may develop or continue to have heartburn as a result of your pregnancy.  You may develop constipation because certain hormones are causing the muscles that push waste through your intestines to slow down.  You may develop hemorrhoids or swollen, bulging veins (varicose veins).  You may have back pain. This is caused by:  Weight gain.  Pregnancy hormones that are relaxing the joints in your pelvis.  A shift in weight and the muscles that support your balance.  Your breasts will continue to grow and they will continue to become tender.  Your gums may bleed and may be sensitive to brushing and flossing.  Dark spots or blotches (chloasma, mask of pregnancy) may develop on your face. This will likely fade after the baby is born.  A dark line  from your belly button to the pubic area (linea nigra) may appear. This will likely fade after the baby is born.  You may have changes in your hair. These can include thickening of your hair, rapid growth, and changes in texture. Some women also have hair loss during or after pregnancy, or hair that feels dry or thin. Your hair will most likely return to normal after your baby is born. What to expect at prenatal visits During a routine prenatal visit:  You will be weighed to make sure you and the fetus are growing normally.  Your blood pressure will be taken.  Your abdomen will be measured to track your baby's growth.  The fetal heartbeat will be listened to.  Any test results from the previous visit will be discussed. Your health care provider may ask you:  How you are feeling.  If you are feeling the baby move.  If you have had any abnormal symptoms, such as leaking fluid, bleeding, severe headaches, or abdominal cramping.  If you are using any tobacco products, including cigarettes, chewing tobacco, and electronic cigarettes.  If you have any questions. Other tests that may be performed during your second trimester include:  Blood tests that check for:  Low iron levels (anemia).  Gestational diabetes (between 24 and 28 weeks).  Rh antibodies. This is to check for a protein on red blood cells (Rh factor).  Urine tests to check for infections, diabetes, or protein in the urine.  An ultrasound to   confirm the proper growth and development of the baby.  An amniocentesis to check for possible genetic problems.  Fetal screens for spina bifida and Down syndrome.  HIV (human immunodeficiency virus) testing. Routine prenatal testing includes screening for HIV, unless you choose not to have this test. Follow these instructions at home: Eating and drinking  Continue to eat regular, healthy meals.  Avoid raw meat, uncooked cheese, cat litter boxes, and soil used by cats. These  carry germs that can cause birth defects in the baby.  Take your prenatal vitamins.  Take 1500-2000 mg of calcium daily starting at the 20th week of pregnancy until you deliver your baby.  If you develop constipation:  Take over-the-counter or prescription medicines.  Drink enough fluid to keep your urine clear or pale yellow.  Eat foods that are high in fiber, such as fresh fruits and vegetables, whole grains, and beans.  Limit foods that are high in fat and processed sugars, such as fried and sweet foods. Activity  Exercise only as directed by your health care provider. Experiencing uterine cramps is a good sign to stop exercising.  Avoid heavy lifting, wear low heel shoes, and practice good posture.  Wear your seat belt at all times when driving.  Rest with your legs elevated if you have leg cramps or low back pain.  Wear a good support bra for breast tenderness.  Do not use hot tubs, steam rooms, or saunas. Lifestyle  Avoid all smoking, herbs, alcohol, and unprescribed drugs. These chemicals affect the formation and growth of the baby.  Do not use any products that contain nicotine or tobacco, such as cigarettes and e-cigarettes. If you need help quitting, ask your health care provider.  A sexual relationship may be continued unless your health care provider directs you otherwise. General instructions  Follow your health care provider's instructions regarding medicine use. There are medicines that are either safe or unsafe to take during pregnancy.  Take warm sitz baths to soothe any pain or discomfort caused by hemorrhoids. Use hemorrhoid cream if your health care provider approves.  If you develop varicose veins, wear support hose. Elevate your feet for 15 minutes, 3-4 times a day. Limit salt in your diet.  Visit your dentist if you have not gone yet during your pregnancy. Use a soft toothbrush to brush your teeth and be gentle when you floss.  Keep all follow-up  prenatal visits as told by your health care provider. This is important. Contact a health care provider if:  You have dizziness.  You have mild pelvic cramps, pelvic pressure, or nagging pain in the abdominal area.  You have persistent nausea, vomiting, or diarrhea.  You have a bad smelling vaginal discharge.  You have pain with urination. Get help right away if:  You have a fever.  You are leaking fluid from your vagina.  You have spotting or bleeding from your vagina.  You have severe abdominal cramping or pain.  You have rapid weight gain or weight loss.  You have shortness of breath with chest pain.  You notice sudden or extreme swelling of your face, hands, ankles, feet, or legs.  You have not felt your baby move in over an hour.  You have severe headaches that do not go away with medicine.  You have vision changes. Summary  The second trimester is from week 13 through week 28 (months 4 through 6). It is also a time when the fetus is growing rapidly.  Your body goes   through many changes during pregnancy. The changes vary from woman to woman.  Avoid all smoking, herbs, alcohol, and unprescribed drugs. These chemicals affect the formation and growth your baby.  Do not use any tobacco products, such as cigarettes, chewing tobacco, and e-cigarettes. If you need help quitting, ask your health care provider.  Contact your health care provider if you have any questions. Keep all prenatal visits as told by your health care provider. This is important. This information is not intended to replace advice given to you by your health care provider. Make sure you discuss any questions you have with your health care provider. Document Released: 02/15/2001 Document Revised: 07/30/2015 Document Reviewed: 04/24/2012 Elsevier Interactive Patient Education  2017 [employer]  

## 2016-03-29 NOTE — Progress Notes (Signed)
Subjective:  Alyssa Goodman is a 26 y.o. G1P0 at 3851w0d being seen today for ongoing prenatal care.  She is currently monitored for the following issues for this low-risk pregnancy and has Asthma, chronic; Morbid obesity; Encounter for supervision of normal pregnancy, antepartum; Genital herpes complicating pregnancy; and Obesity complicating pregnancy on her problem list.  Patient reports no complaints.  Contractions: Not present. Vag. Bleeding: None.   . Denies leaking of fluid.   The following portions of the patient's history were reviewed and updated as appropriate: allergies, current medications, past family history, past medical history, past social history, past surgical history and problem list. Problem list updated.  Objective:   Vitals:   03/29/16 0815 03/29/16 0818  BP: 132/84 101/68  Pulse: 94   Weight: 296 lb (134.3 kg)     Fetal Status: Fetal Heart Rate (bpm): 144         General:  Alert, oriented and cooperative. Patient is in no acute distress.  Skin: Skin is warm and dry. No rash noted.   Cardiovascular: Normal heart rate noted  Respiratory: Normal respiratory effort, no problems with respiration noted  Abdomen: Soft, gravid, appropriate for gestational age. Pain/Pressure: Absent     Pelvic:  Cervical exam deferred        Extremities: Normal range of motion.  Edema: None  Mental Status: Normal mood and affect. Normal behavior. Normal judgment and thought content.   Urinalysis:      Assessment and Plan:  Pregnancy: G1P0 at 1651w0d  1. Encounter for supervision of normal pregnancy, antepartum, unspecified gravidity Missed NT screen d/t weather Quad screen at next appt Schedule anatomy scan at next visit Recently treated for UTI and Bv, Sx have resolved  2. Genital herpes affecting pregnancy in second trimester Prophylactic treatment at 34-36 weeks   3. Obesity affecting pregnancy in second trimester Diet and excersize reviewed with pt.  Preterm labor symptoms  and general obstetric precautions including but not limited to vaginal bleeding, contractions, leaking of fluid and fetal movement were reviewed in detail with the patient. Please refer to After Visit Summary for other counseling recommendations.  Return in about 4 weeks (around 04/26/2016) for OB visit.   Hermina StaggersMichael L Chrisma Hurlock, MD

## 2016-04-12 ENCOUNTER — Other Ambulatory Visit: Payer: Self-pay

## 2016-04-12 DIAGNOSIS — Z3490 Encounter for supervision of normal pregnancy, unspecified, unspecified trimester: Secondary | ICD-10-CM

## 2016-04-12 MED ORDER — PREPLUS 27-1 MG PO TABS: 1.0000 | ORAL_TABLET | Freq: Every day | ORAL | 12 refills | Status: DC

## 2016-04-12 NOTE — Progress Notes (Unsigned)
Pt will PNV gummies requesting to change vits to tabs.

## 2016-04-25 ENCOUNTER — Ambulatory Visit (INDEPENDENT_AMBULATORY_CARE_PROVIDER_SITE_OTHER): Payer: Medicaid Other | Admitting: Obstetrics and Gynecology

## 2016-04-25 VITALS — BP 115/78 | Wt 298.0 lb

## 2016-04-25 DIAGNOSIS — A6009 Herpesviral infection of other urogenital tract: Secondary | ICD-10-CM

## 2016-04-25 DIAGNOSIS — O99212 Obesity complicating pregnancy, second trimester: Secondary | ICD-10-CM

## 2016-04-25 DIAGNOSIS — A609 Anogenital herpesviral infection, unspecified: Secondary | ICD-10-CM

## 2016-04-25 DIAGNOSIS — O98312 Other infections with a predominantly sexual mode of transmission complicating pregnancy, second trimester: Secondary | ICD-10-CM

## 2016-04-25 DIAGNOSIS — Z3490 Encounter for supervision of normal pregnancy, unspecified, unspecified trimester: Secondary | ICD-10-CM

## 2016-04-25 NOTE — Progress Notes (Signed)
   PRENATAL VISIT NOTE  Subjective:  Alyssa Goodman is a 26 y.o. G1P0 at 2952w6d being seen today for ongoing prenatal care.  She is currently monitored for the following issues for this low-risk pregnancy and has Asthma, chronic; Morbid obesity; Encounter for supervision of normal pregnancy, antepartum; Genital herpes complicating pregnancy; and Obesity complicating pregnancy on her problem list.  Patient reports no complaints.  Contractions: Not present. Vag. Bleeding: None.   . Denies leaking of fluid.   The following portions of the patient's history were reviewed and updated as appropriate: allergies, current medications, past family history, past medical history, past social history, past surgical history and problem list. Problem list updated.  Objective:   Vitals:   04/25/16 0837  BP: 115/78  Weight: 298 lb (135.2 kg)    Fetal Status: Fetal Heart Rate (bpm): 142         General:  Alert, oriented and cooperative. Patient is in no acute distress.  Skin: Skin is warm and dry. No rash noted.   Cardiovascular: Normal heart rate noted  Respiratory: Normal respiratory effort, no problems with respiration noted  Abdomen: Soft, gravid, appropriate for gestational age. Pain/Pressure: Absent     Pelvic:  Cervical exam deferred        Extremities: Normal range of motion.  Edema: Trace  Mental Status: Normal mood and affect. Normal behavior. Normal judgment and thought content.   Assessment and Plan:  Pregnancy: G1P0 at 2252w6d  1. Encounter for supervision of normal pregnancy, antepartum, unspecified gravidity Patient is doing well Quad screen and anatomy ultrasound scheduled - AFP, Quad Screen - US MFM OB COMP + 14 WK; Future  2. Obesity affecting pregnancy in second trimester   3. Morbid obesity Discussed remaining active Patient requested a work note to sit while on the job. Informed patient that I will provide a note to allow her to have frequent breaks  4. Genital herpes  affecting pregnancy in second trimester Will start prophylaxis at 34-36 weeks  General obstetric precautions including but not limited to vaginal bleeding, contractions, leaking of fluid and fetal movement were reviewed in detail with the patient. Please refer to After Visit Summary for other counseling recommendations.  Return in about 4 weeks (around 05/23/2016) for ROB.   Catalina AntiguaPeggy Darthy Manganelli, MD

## 2016-04-25 NOTE — Progress Notes (Signed)
Pt wants Quad screen today.

## 2016-05-02 ENCOUNTER — Encounter (HOSPITAL_COMMUNITY): Payer: Self-pay

## 2016-05-02 ENCOUNTER — Inpatient Hospital Stay (HOSPITAL_COMMUNITY)
Admission: AD | Admit: 2016-05-02 | Discharge: 2016-05-02 | Disposition: A | Discharge status: Home / Self Care | Payer: Medicaid Other | Source: Ambulatory Visit | Attending: Obstetrics and Gynecology | Admitting: Obstetrics and Gynecology

## 2016-05-02 ENCOUNTER — Telehealth: Payer: Self-pay

## 2016-05-02 DIAGNOSIS — Z3A18 18 weeks gestation of pregnancy: Secondary | ICD-10-CM | POA: Insufficient documentation

## 2016-05-02 DIAGNOSIS — Z87891 Personal history of nicotine dependence: Secondary | ICD-10-CM | POA: Insufficient documentation

## 2016-05-02 DIAGNOSIS — O219 Vomiting of pregnancy, unspecified: Secondary | ICD-10-CM | POA: Diagnosis present

## 2016-05-02 DIAGNOSIS — A5901 Trichomonal vulvovaginitis: Secondary | ICD-10-CM | POA: Diagnosis present

## 2016-05-02 DIAGNOSIS — O210 Mild hyperemesis gravidarum: Secondary | ICD-10-CM | POA: Diagnosis not present

## 2016-05-02 DIAGNOSIS — O98312 Other infections with a predominantly sexual mode of transmission complicating pregnancy, second trimester: Secondary | ICD-10-CM | POA: Insufficient documentation

## 2016-05-02 DIAGNOSIS — R12 Heartburn: Secondary | ICD-10-CM | POA: Insufficient documentation

## 2016-05-02 DIAGNOSIS — O26892 Other specified pregnancy related conditions, second trimester: Secondary | ICD-10-CM | POA: Diagnosis present

## 2016-05-02 DIAGNOSIS — O23592 Infection of other part of genital tract in pregnancy, second trimester: Secondary | ICD-10-CM

## 2016-05-02 HISTORY — DX: Calculus of gallbladder without cholecystitis without obstruction: K80.20

## 2016-05-02 LAB — COMPREHENSIVE METABOLIC PANEL
ALBUMIN: 3.2 g/dL — AB (ref 3.5–5.0)
ALK PHOS: 45 U/L (ref 38–126)
ALT: 21 U/L (ref 14–54)
AST: 20 U/L (ref 15–41)
Anion gap: 9 (ref 5–15)
BILIRUBIN TOTAL: 0.2 mg/dL — AB (ref 0.3–1.2)
BUN: 6 mg/dL (ref 6–20)
CO2: 24 mmol/L (ref 22–32)
Calcium: 9.1 mg/dL (ref 8.9–10.3)
Chloride: 104 mmol/L (ref 101–111)
Creatinine, Ser: 0.8 mg/dL (ref 0.44–1.00)
GFR calc Af Amer: >60 mL/min (ref >60)
GFR calc non Af Amer: >60 mL/min (ref >60)
GLUCOSE: 88 mg/dL (ref 65–99)
POTASSIUM: 4.1 mmol/L (ref 3.5–5.1)
Sodium: 137 mmol/L (ref 135–145)
TOTAL PROTEIN: 7 g/dL (ref 6.5–8.1)

## 2016-05-02 LAB — CBC
HEMATOCRIT: 36.9 % (ref 36.0–46.0)
HEMOGLOBIN: 12 g/dL (ref 12.0–15.0)
MCH: 26.9 pg (ref 26.0–34.0)
MCHC: 32.5 g/dL (ref 30.0–36.0)
MCV: 82.7 fL (ref 78.0–100.0)
Platelets: 272 10*3/uL (ref 150–400)
RBC: 4.46 MIL/uL (ref 3.87–5.11)
RDW: 14.5 % (ref 11.5–15.5)
WBC: 15.7 10*3/uL — AB (ref 4.0–10.5)

## 2016-05-02 LAB — URINALYSIS, MICROSCOPIC (REFLEX)

## 2016-05-02 LAB — URINALYSIS, ROUTINE W REFLEX MICROSCOPIC
BILIRUBIN URINE: NEGATIVE
GLUCOSE, UA: NEGATIVE mg/dL
Ketones, ur: NEGATIVE mg/dL
Nitrite: NEGATIVE
PH: 5.5 (ref 5.0–8.0)
Protein, ur: NEGATIVE mg/dL
SPECIFIC GRAVITY, URINE: 1.025 (ref 1.005–1.030)

## 2016-05-02 LAB — LIPASE, BLOOD: Lipase: 12 U/L (ref 11–51)

## 2016-05-02 MED ORDER — ONDANSETRON HCL 4 MG PO TABS: 4.0000 mg | ORAL_TABLET | Freq: Three times a day (TID) | ORAL | 3 refills | Status: DC | PRN

## 2016-05-02 MED ORDER — RANITIDINE HCL 150 MG PO TABS: 150.0000 mg | ORAL_TABLET | Freq: Two times a day (BID) | ORAL | 5 refills | Status: DC

## 2016-05-02 MED ORDER — FAMOTIDINE 20 MG PO TABS
40.0000 mg | ORAL_TABLET | Freq: Once | ORAL | Status: AC
  Administered 2016-05-02: 40 mg via ORAL
  Filled 2016-05-02: qty 2

## 2016-05-02 NOTE — MAU Note (Signed)
Pt brought to rm, no vomiting while waiting in lobby

## 2016-05-02 NOTE — Telephone Encounter (Signed)
Case worker, Pam, from [facility] called in and stated that the patient called her this morning and stated that she is throwing up blood. Advised to inform the patient to go to the hospital as soon as possible.

## 2016-05-02 NOTE — MAU Note (Signed)
Was nauseous this morning, trying to throw up but couldn't was spitting up blood.  Called office, was told to come in . Was having pain on rt side, now it in lower abd.  Normal "morning sickness, nothing excessive"

## 2016-05-02 NOTE — MAU Provider Note (Signed)
Chief Complaint: Hemoptysis and Abdominal Pain   First Provider Initiated Contact with Patient 05/02/16 1433      SUBJECTIVE HPI: Alyssa Goodman is a 26 y.o. G1P0 at 4878w6d by LMP who presents to maternity admissions reporting n/v of pregnancy with strings of light red bleeding noted in emesis starting today. She reports vomiting 2-4 x daily since early pregnancy. She is taking Phenergan for nausea, which does help but she still vomits daily.  Eating certain foods makes the vomiting worse.  She was scared by the blood she saw today so came to be evaluated.  She denies any constipation or diarrhea.  She has bilateral inguinal pain worse with walking/changing positions, improved with rest.  She denies vaginal bleeding, vaginal itching/burning, urinary symptoms, h/a, dizziness, or fever/chills.     HPI  Past Medical History:  Diagnosis Date  . Asthma   . Gallstones   . Obesity    History reviewed. No pertinent surgical history. Social History   Social History  . Marital status: Single    Spouse name: N/A  . Number of children: N/A  . Years of education: 6514   Occupational History  . UNEMPLOYED Unenployed   Social History Main Topics  . Smoking status: Former Smoker    Types: Cigarettes  . Smokeless tobacco: Never Used     Comment: stopped with +UPT  . Alcohol use No  . Drug use: No  . Sexual activity: Yes    Birth control/ protection: None   Other Topics Concern  . Not on file   Social History Narrative  . No narrative on file   No current facility-administered medications on file prior to encounter.    Current Outpatient Prescriptions on File Prior to Encounter  Medication Sig Dispense Refill  . Prenatal Vit-Fe Fumarate-FA (PREPLUS) 27-1 MG TABS Take 1 tablet by mouth daily. 30 tablet 12  . promethazine (PHENERGAN) 25 MG tablet Take 0.5-1 tablets (12.5-25 mg total) by mouth every 6 (six) hours as needed. (Patient taking differently: Take 12.5-25 mg by mouth every 6  (six) hours as needed for nausea or vomiting. ) 30 tablet 0  . albuterol (PROVENTIL HFA;VENTOLIN HFA) 108 (90 BASE) MCG/ACT inhaler Inhale 2 puffs into the lungs every 6 (six) hours as needed for wheezing.    . phenazopyridine (PYRIDIUM) 200 MG tablet Take 1 tablet (200 mg total) by mouth 3 (three) times daily. (Patient not taking: Reported on 03/29/2016) 6 tablet 0   Allergies  Allergen Reactions  . Metronidazole Shortness Of Breath and Rash  . Kiwi Extract Itching  . Shellfish Allergy     Mostly oysters and limited to number of shrimp.    ROS:  Review of Systems  Constitutional: Negative for chills, fatigue and fever.  Respiratory: Negative for shortness of breath.   Cardiovascular: Negative for chest pain.  Gastrointestinal: Positive for nausea and vomiting.       Hemoptysis   Genitourinary: Positive for pelvic pain. Negative for difficulty urinating, dysuria, flank pain, vaginal bleeding, vaginal discharge and vaginal pain.  Neurological: Negative for dizziness and headaches.  Psychiatric/Behavioral: Negative.      I have reviewed patient's Past Medical Hx, Surgical Hx, Family Hx, Social Hx, medications and allergies.   Physical Exam   Patient Vitals for the past 24 hrs:  BP Temp Temp src Pulse Resp Weight  05/02/16 1318 114/83 - - 79 18 -  05/02/16 1212 131/67 98.4 F (36.9 C) Oral 84 18 296 lb 8 oz (134.5 kg)  Constitutional: Well-developed, well-nourished female in no acute distress.  Cardiovascular: normal rate Respiratory: normal effort GI: Abd soft, non-tender. Pos BS x 4 MS: Extremities nontender, no edema, normal ROM Neurologic: Alert and oriented x 4.  GU: Neg CVAT.  PELVIC EXAM: Deferred  FHT 142 by doppler  LAB RESULTS Results for orders placed or performed during the hospital encounter of 05/02/16 (from the past 24 hour(s))  Urinalysis, Routine w reflex microscopic     Status: Abnormal   Collection Time: 05/02/16 12:18 PM  Result Value Ref Range    Color, Urine YELLOW YELLOW   APPearance CLOUDY (A) CLEAR   Specific Gravity, Urine 1.025 1.005 - 1.030   pH 5.5 5.0 - 8.0   Glucose, UA NEGATIVE NEGATIVE mg/dL   Hgb urine dipstick SMALL (A) NEGATIVE   Bilirubin Urine NEGATIVE NEGATIVE   Ketones, ur NEGATIVE NEGATIVE mg/dL   Protein, ur NEGATIVE NEGATIVE mg/dL   Nitrite NEGATIVE NEGATIVE   Leukocytes, UA MODERATE (A) NEGATIVE  Urinalysis, Microscopic (reflex)     Status: Abnormal   Collection Time: 05/02/16 12:18 PM  Result Value Ref Range   RBC / HPF 6-30 0 - 5 RBC/hpf   WBC, UA 6-30 0 - 5 WBC/hpf   Bacteria, UA MANY (A) NONE SEEN   Squamous Epithelial / LPF 0-5 (A) NONE SEEN   Trichomonas, UA PRESENT     O/Positive/-- (12/26 1310)  IMAGING No results found.  MAU Management/MDM: Ordered labs and reviewed results. Pt having intermittent nausea, worse in last few days, likely acid reflux causing bleeding with emesis.  Will treat acid with Pepcid 40 mg PO in MAU, Rx for Zantac 150 mg BID PRN.  Will add Rx for Zofran 4 mg PO Q 8 hours PRN nausea and pt to alternate with Phenergan at home.    Trichomonas positive in today's urine. Discussed with pt. She reports treatment for trichomonas with anaphalaxis to Flagyl last year.  She denies intercourse x 5 months and has negative gonorrhea/chlamydia testing and negative HIV recently. There are no recommended treatments for trichomonas other than Flagyl and Tindamax, which has cross-reactivity to Flagyl.  Per Up-to-Date, it is recommended to refer her to allergist for desensitization.  Message sent to [facility] for referral.  This may or may not be possible during pregnancy. Pt is aware of plan. [facility] to contact pt with allergy appointment or contact information.   Pt stable at time of discharge.  ASSESSMENT  1. Heartburn during pregnancy in second trimester   2. Nausea and vomiting during pregnancy prior to [redacted] weeks gestation   3. Trichomonal vaginitis during pregnancy in second trimester     PLAN Discharge home     Sharen Counter Certified Nurse-Midwife 05/02/2016  3:19 PM

## 2016-05-03 ENCOUNTER — Ambulatory Visit (HOSPITAL_COMMUNITY)
Admission: RE | Admit: 2016-05-03 | Discharge: 2016-05-03 | Disposition: A | Discharge status: Home / Self Care | Payer: Medicaid Other | Source: Ambulatory Visit | Attending: Obstetrics and Gynecology | Admitting: Obstetrics and Gynecology

## 2016-05-03 ENCOUNTER — Other Ambulatory Visit: Payer: Self-pay | Admitting: Obstetrics and Gynecology

## 2016-05-03 DIAGNOSIS — Z3A19 19 weeks gestation of pregnancy: Secondary | ICD-10-CM

## 2016-05-03 DIAGNOSIS — O99212 Obesity complicating pregnancy, second trimester: Secondary | ICD-10-CM

## 2016-05-03 DIAGNOSIS — Z363 Encounter for antenatal screening for malformations: Secondary | ICD-10-CM

## 2016-05-03 DIAGNOSIS — Z3490 Encounter for supervision of normal pregnancy, unspecified, unspecified trimester: Secondary | ICD-10-CM

## 2016-05-04 ENCOUNTER — Other Ambulatory Visit: Payer: Self-pay | Admitting: Obstetrics and Gynecology

## 2016-05-04 ENCOUNTER — Telehealth: Payer: Self-pay | Admitting: *Deleted

## 2016-05-04 MED ORDER — TINIDAZOLE 500 MG PO TABS: 2.0000 g | ORAL_TABLET | Freq: Once | ORAL | 1 refills | Status: AC

## 2016-05-04 NOTE — Telephone Encounter (Signed)
Patient called back- she was given metronidazole a few years ago and did fine. The second time she took it she had itching on her arms and neck with a rash. She then had some trouble with her throat. She did go to the doctor and she was treated for an allergic reaction. She was given an alternative medication which she did fine with. In review of her chart she has taken Tindamax in the past. Told patient I would review with her provider and let her know what she decided.

## 2016-05-04 NOTE — Telephone Encounter (Signed)
-----   Message from Hurshel PartyLisa A Leftwich-Kirby, CNM sent at 05/02/2016  3:10 PM EST ----- Regarding: Pt needs referal for allergist  G1 @ 4035w6d was seen in MAU and has positive trichomonas in her urine. She has had gonorrhea/chlamydia testing recently and is negative. She has anaphalaxis to Flagyl, given last year for trichomonas.  There are no other recommended treatments for trichomonas besides Flagyl and Tindamax, a similar drug with cross-reactivity.  It is recommended that people who are unable to take Flagyl be referred to allergy for desensitization therapy.  I do not know if this can be done in pregnancy or needs to wait until postpartum. Please refer her to allergist as soon as possible. She is aware of this plan, so call her with appointment.Thank you.

## 2016-05-04 NOTE — Telephone Encounter (Signed)
Dr Jolayne Pantheronstant would like more information from patient about her allergic reaction to flagyl. LM on VM to CB

## 2016-05-05 LAB — AFP, QUAD SCREEN
DIA Mom Value: 1.03
DIA VALUE (EIA): 124.24 pg/mL
DSR (By Age)    1 IN: 1017
DSR (SECOND TRIMESTER) 1 IN: 10000
GESTATIONAL AGE AFP: 17.9 wk
MSAFP MOM: 1.19
MSAFP: 37.4 ng/mL
MSHCG MOM: 2.55
MSHCG: 47071 m[IU]/mL
Maternal Age At EDD: 25.3 YEARS
Osb Risk: 10000
TEST RESULTS AFP: NEGATIVE
UE3 MOM: 1.05
WEIGHT: 298 [lb_av]
uE3 Value: 1.16 ng/mL

## 2016-05-23 ENCOUNTER — Ambulatory Visit (INDEPENDENT_AMBULATORY_CARE_PROVIDER_SITE_OTHER): Payer: Medicaid Other | Admitting: Certified Nurse Midwife

## 2016-05-23 VITALS — BP 137/81 | HR 98 | Wt 296.4 lb

## 2016-05-23 DIAGNOSIS — A6009 Herpesviral infection of other urogenital tract: Secondary | ICD-10-CM

## 2016-05-23 DIAGNOSIS — A5901 Trichomonal vulvovaginitis: Secondary | ICD-10-CM

## 2016-05-23 DIAGNOSIS — O98312 Other infections with a predominantly sexual mode of transmission complicating pregnancy, second trimester: Secondary | ICD-10-CM

## 2016-05-23 DIAGNOSIS — Z9189 Other specified personal risk factors, not elsewhere classified: Secondary | ICD-10-CM

## 2016-05-23 DIAGNOSIS — Z6841 Body Mass Index (BMI) 40.0 and over, adult: Secondary | ICD-10-CM

## 2016-05-23 DIAGNOSIS — B9689 Other specified bacterial agents as the cause of diseases classified elsewhere: Secondary | ICD-10-CM

## 2016-05-23 DIAGNOSIS — Z889 Allergy status to unspecified drugs, medicaments and biological substances status: Secondary | ICD-10-CM

## 2016-05-23 DIAGNOSIS — Z3402 Encounter for supervision of normal first pregnancy, second trimester: Secondary | ICD-10-CM

## 2016-05-23 DIAGNOSIS — O26892 Other specified pregnancy related conditions, second trimester: Secondary | ICD-10-CM

## 2016-05-23 DIAGNOSIS — R12 Heartburn: Secondary | ICD-10-CM

## 2016-05-23 DIAGNOSIS — N760 Acute vaginitis: Secondary | ICD-10-CM

## 2016-05-23 DIAGNOSIS — A609 Anogenital herpesviral infection, unspecified: Secondary | ICD-10-CM

## 2016-05-23 DIAGNOSIS — Z3490 Encounter for supervision of normal pregnancy, unspecified, unspecified trimester: Secondary | ICD-10-CM

## 2016-05-23 DIAGNOSIS — J4520 Mild intermittent asthma, uncomplicated: Secondary | ICD-10-CM

## 2016-05-23 DIAGNOSIS — O99512 Diseases of the respiratory system complicating pregnancy, second trimester: Secondary | ICD-10-CM

## 2016-05-23 DIAGNOSIS — O23592 Infection of other part of genital tract in pregnancy, second trimester: Secondary | ICD-10-CM

## 2016-05-23 MED ORDER — CLINDAMYCIN PHOSPHATE 2 % VA CREA
1.0000 | TOPICAL_CREAM | Freq: Every day | VAGINAL | 0 refills | Status: DC
Start: 2016-05-23 — End: 2016-07-15

## 2016-05-23 MED ORDER — TINIDAZOLE 500 MG PO TABS: 2.0000 g | ORAL_TABLET | Freq: Once | ORAL | 0 refills | Status: AC

## 2016-05-23 NOTE — Progress Notes (Signed)
Patient is in the office, reports good fetal movement. 

## 2016-05-23 NOTE — Progress Notes (Signed)
   PRENATAL VISIT NOTE  Subjective:  Alyssa Goodman is a 26 y.o. G1P0 at 3251w6d being seen today for ongoing prenatal care.  She is currently monitored for the following issues for this low-risk pregnancy and has Asthma, chronic; Morbid obesity; Encounter for supervision of normal pregnancy, antepartum; Genital herpes complicating pregnancy; Obesity complicating pregnancy; Trichomonal vaginitis during pregnancy in second trimester; Nausea and vomiting during pregnancy prior to [redacted] weeks gestation; Heartburn during pregnancy in second trimester; and Morbid obesity with BMI of 50.0-59.9, adult (HCC) on her problem list.  Patient reports no complaints.  Contractions: Not present. Vag. Bleeding: None.  Movement: Present. Denies leaking of fluid.   The following portions of the patient's history were reviewed and updated as appropriate: allergies, current medications, past family history, past medical history, past social history, past surgical history and problem list. Problem list updated.  Objective:   Vitals:   05/23/16 0904  BP: 137/81  Pulse: 98  Weight: 296 lb 6.4 oz (134.4 kg)    Fetal Status: Fetal Heart Rate (bpm): 154 Fundal Height: 22 cm Movement: Present     General:  Alert, oriented and cooperative. Patient is in no acute distress.  Skin: Skin is warm and dry. No rash noted.   Cardiovascular: Normal heart rate noted  Respiratory: Normal respiratory effort, no problems with respiration noted  Abdomen: Soft, gravid, appropriate for gestational age. Pain/Pressure: Absent     Pelvic:  Cervical exam deferred        Extremities: Normal range of motion.  Edema: Trace  Mental Status: Normal mood and affect. Normal behavior. Normal judgment and thought content.   Assessment and Plan:  Pregnancy: G1P0 at 7251w6d  1. Encounter for supervision of normal pregnancy, antepartum, unspecified gravidity      - US MFM OB FOLLOW UP; Future  2. BV (bacterial vaginosis) &Trichomoniasis        2  gm Tindamax X 1dose.   - clindamycin (CLEOCIN) 2 % vaginal cream; Place 1 Applicatorful vaginally at bedtime.  Dispense: 40 g; Refill: 0 for BV  3. Mild intermittent chronic asthma without complication     - Ambulatory referral to Allergy  4. Genital herpes affecting pregnancy in second trimester     valcyclovir @36  weeks  5. Heartburn during pregnancy in second trimester       6. H/O multiple allergies     Allergy referral completed.  - Ambulatory referral to Allergy  7. Morbid obesity with BMI of 50.0-59.9, adult (HCC)     Discussed dietary intake and exercise.    Preterm labor symptoms and general obstetric precautions including but not limited to vaginal bleeding, contractions, leaking of fluid and fetal movement were reviewed in detail with the patient. Please refer to After Visit Summary for other counseling recommendations.  Return in about 4 weeks (around 06/20/2016) for ROB, TOC.   Roe Coombsachelle A Mariel Lukins, CNM

## 2016-06-05 ENCOUNTER — Encounter (HOSPITAL_COMMUNITY): Payer: Self-pay | Admitting: *Deleted

## 2016-06-05 ENCOUNTER — Inpatient Hospital Stay (HOSPITAL_COMMUNITY)
Admission: AD | Admit: 2016-06-05 | Discharge: 2016-06-05 | Disposition: A | Discharge status: Home / Self Care | Payer: Medicaid Other | Source: Ambulatory Visit | Attending: Obstetrics & Gynecology | Admitting: Obstetrics & Gynecology

## 2016-06-05 DIAGNOSIS — Z87891 Personal history of nicotine dependence: Secondary | ICD-10-CM | POA: Insufficient documentation

## 2016-06-05 DIAGNOSIS — E669 Obesity, unspecified: Secondary | ICD-10-CM | POA: Insufficient documentation

## 2016-06-05 DIAGNOSIS — O99512 Diseases of the respiratory system complicating pregnancy, second trimester: Secondary | ICD-10-CM | POA: Insufficient documentation

## 2016-06-05 DIAGNOSIS — O99212 Obesity complicating pregnancy, second trimester: Secondary | ICD-10-CM | POA: Insufficient documentation

## 2016-06-05 DIAGNOSIS — Z3A23 23 weeks gestation of pregnancy: Secondary | ICD-10-CM | POA: Insufficient documentation

## 2016-06-05 DIAGNOSIS — O212 Late vomiting of pregnancy: Secondary | ICD-10-CM | POA: Diagnosis present

## 2016-06-05 DIAGNOSIS — O9989 Other specified diseases and conditions complicating pregnancy, childbirth and the puerperium: Secondary | ICD-10-CM | POA: Diagnosis not present

## 2016-06-05 DIAGNOSIS — K8020 Calculus of gallbladder without cholecystitis without obstruction: Secondary | ICD-10-CM | POA: Insufficient documentation

## 2016-06-05 DIAGNOSIS — K529 Noninfective gastroenteritis and colitis, unspecified: Secondary | ICD-10-CM

## 2016-06-05 DIAGNOSIS — J45909 Unspecified asthma, uncomplicated: Secondary | ICD-10-CM | POA: Diagnosis not present

## 2016-06-05 DIAGNOSIS — O99612 Diseases of the digestive system complicating pregnancy, second trimester: Secondary | ICD-10-CM | POA: Insufficient documentation

## 2016-06-05 HISTORY — DX: Trichomoniasis, unspecified: A59.9

## 2016-06-05 LAB — URINALYSIS, ROUTINE W REFLEX MICROSCOPIC
BILIRUBIN URINE: NEGATIVE
Glucose, UA: NEGATIVE mg/dL
HGB URINE DIPSTICK: NEGATIVE
Ketones, ur: 5 mg/dL — AB
Nitrite: NEGATIVE
PROTEIN: 100 mg/dL — AB
Specific Gravity, Urine: 1.028 (ref 1.005–1.030)
pH: 5 (ref 5.0–8.0)

## 2016-06-05 MED ORDER — ONDANSETRON 4 MG PO TBDP: 4.0000 mg | ORAL_TABLET | Freq: Three times a day (TID) | ORAL | 0 refills | Status: DC | PRN

## 2016-06-05 MED ORDER — ONDANSETRON 4 MG PO TBDP
4.0000 mg | ORAL_TABLET | Freq: Once | ORAL | Status: AC
  Administered 2016-06-05: 4 mg via ORAL
  Filled 2016-06-05: qty 1

## 2016-06-05 NOTE — MAU Provider Note (Signed)
History     CSN: [id number]  Arrival date and time: 06/05/16 0124   First Provider Initiated Contact with Patient 06/05/16 0259      Chief Complaint  Patient presents with  . Nausea  . Emesis During Pregnancy  . Diarrhea   HPI   Ms.Alyssa Goodman is a 26 y.o. female G1P0 @ [redacted]w[redacted]d here in MAU with N/V/D. The symptoms started Friday. Late Friday night, into early Saturday she started having diarrhea. She has gone to the bathroom too many times to count. Her mother also has the same symptoms. She has not taken anything for the symptoms.  She has phenergan at home however did not take it.   OB History    Gravida Para Term Preterm AB Living   1             SAB TAB Ectopic Multiple Live Births                  Past Medical History:  Diagnosis Date  . Asthma   . Gallstones   . Obesity   . Trichimoniasis     History reviewed. No pertinent surgical history.  Family History  Problem Relation Age of Onset  . Hypertension Mother   . Diabetes Maternal Uncle   . Hypertension Maternal Uncle   . Diabetes Maternal Grandmother   . Hypertension Maternal Grandmother   . Heart disease Maternal Grandmother   . Cancer Maternal Grandfather     colon    Social History  Substance Use Topics  . Smoking status: Former Smoker    Types: Cigarettes  . Smokeless tobacco: Never Used     Comment: stopped with +UPT  . Alcohol use No    Allergies:  Allergies  Allergen Reactions  . Metronidazole Shortness Of Breath and Rash  . Kiwi Extract Itching  . Shellfish Allergy     Mostly oysters and limited to number of shrimp.    Prescriptions Prior to Admission  Medication Sig Dispense Refill Last Dose  . acetaminophen (TYLENOL) 325 MG tablet Take 650 mg by mouth every 6 (six) hours as needed for mild pain or headache.   Past Week at Unknown time  . tinidazole (TINDAMAX) 500 MG tablet Take 2,000 mg by mouth once.   Past Week at Unknown time  . albuterol (PROVENTIL HFA;VENTOLIN HFA) 108 (90  BASE) MCG/ACT inhaler Inhale 2 puffs into the lungs every 6 (six) hours as needed for wheezing.   More than a month at Unknown time  . clindamycin (CLEOCIN) 2 % vaginal cream Place 1 Applicatorful vaginally at bedtime. 40 g 0   . Prenatal Vit-Fe Fumarate-FA (PREPLUS) 27-1 MG TABS Take 1 tablet by mouth daily. 30 tablet 12 Taking   Results for orders placed or performed during the hospital encounter of 06/05/16 (from the past 48 hour(s))  Urinalysis, Routine w reflex microscopic     Status: Abnormal   Collection Time: 06/05/16  1:50 AM  Result Value Ref Range   Color, Urine AMBER (A) YELLOW    Comment: BIOCHEMICALS MAY BE AFFECTED BY COLOR   APPearance CLOUDY (A) CLEAR   Specific Gravity, Urine 1.028 1.005 - 1.030   pH 5.0 5.0 - 8.0   Glucose, UA NEGATIVE NEGATIVE mg/dL   Hgb urine dipstick NEGATIVE NEGATIVE   Bilirubin Urine NEGATIVE NEGATIVE   Ketones, ur 5 (A) NEGATIVE mg/dL   Protein, ur 782 (A) NEGATIVE mg/dL   Nitrite NEGATIVE NEGATIVE   Leukocytes, UA SMALL (A) NEGATIVE  RBC / HPF 6-30 0 - 5 RBC/hpf   WBC, UA 6-30 0 - 5 WBC/hpf   Bacteria, UA RARE (A) NONE SEEN   Squamous Epithelial / LPF 6-30 (A) NONE SEEN   Mucous PRESENT    Ca Oxalate Crys, UA PRESENT    Review of Systems  Constitutional: Negative for fever.  Gastrointestinal: Positive for diarrhea, nausea and vomiting.   Physical Exam   Blood pressure 118/70, pulse 96, temperature 98.1 F (36.7 C), resp. rate 18, height  (1.549 m), weight 289 lb (131.1 kg), last menstrual period 12/22/2015.  Physical Exam  Constitutional: She is oriented to person, place, and time. Vital signs are normal.  Non-toxic appearance. She does not have a sickly appearance. She does not appear ill. No distress.  HENT:  Head: Normocephalic.  Eyes: Pupils are equal, round, and reactive to light.  Respiratory: Effort normal.  Musculoskeletal: Normal range of motion.  Neurological: She is alert and oriented to person, place, and time.   Skin: Skin is warm.  Psychiatric: Her behavior is normal.   Fetal Tracing: Baseline:125 bpm Variability: minimal  Accelerations: none Decelerations: Quick variables  Toco: none  MAU Course  Procedures None  MDM  NST UA without signs of severe dehydration. Zofran 4 mg PO Push PO fluids in MAU.   Assessment and Plan   A:  1. Gastroenteritis, acute     P:  Discharge home in stable condition Rx: Zofran Ok to use imodium, over the counter as directed Return to MAU as needed, if symptoms worsen  Increase PO fluids   Duane Lope, NP 06/06/2016 8:52 AM

## 2016-06-05 NOTE — Progress Notes (Signed)
J Rasch NP notified of pt's admission and status. Aware of no ctxs, FHR appropriate for 23.5wks. U/A results. Will se pt

## 2016-06-05 NOTE — Progress Notes (Signed)
OK per Blanche East NP to d/c efm

## 2016-06-05 NOTE — Discharge Instructions (Signed)
Food Choices to Help Relieve Diarrhea, Adult °When you have diarrhea, the foods you eat and your eating habits are very important. Choosing the right foods and drinks can help: °· Relieve diarrhea. °· Replace lost fluids and nutrients. °· Prevent dehydration. °What general guidelines should I follow? °Relieving diarrhea  °· Choose foods with less than 2 g or .07 oz. of fiber per serving. °· Limit fats to less than 8 tsp (38 g or 1.34 oz.) a day. °· Avoid the following: °¨ Foods and beverages sweetened with high-fructose corn syrup, honey, or sugar alcohols such as xylitol, sorbitol, and mannitol. °¨ Foods that contain a lot of fat or sugar. °¨ Fried, greasy, or spicy foods. °¨ High-fiber grains, breads, and cereals. °¨ Raw fruits and vegetables. °· Eat foods that are rich in probiotics. These foods include dairy products such as yogurt and fermented milk products. They help increase healthy bacteria in the stomach and intestines (gastrointestinal tract, or GI tract). °· If you have lactose intolerance, avoid dairy products. These may make your diarrhea worse. °· Take medicine to help stop diarrhea (antidiarrheal medicine) only as told by your health care provider. °Replacing nutrients  °· Eat small meals or snacks every 3-4 hours. °· Eat bland foods, such as white rice, toast, or baked potato, until your diarrhea starts to get better. Gradually reintroduce nutrient-rich foods as tolerated or as told by your health care provider. This includes: °¨ Well-cooked protein foods. °¨ Peeled, seeded, and soft-cooked fruits and vegetables. °¨ Low-fat dairy products. °· Take vitamin and mineral supplements as told by your health care provider. °Preventing dehydration  ° °· Start by sipping water or a special solution to prevent dehydration (oral rehydration solution, ORS). Urine that is clear or pale yellow means that you are getting enough fluid. °· Try to drink at least 8-10 cups of fluid each day to help replace lost  fluids. °· You may add other liquids in addition to water, such as clear juice or decaffeinated sports drinks, as tolerated or as told by your health care provider. °· Avoid drinks with caffeine, such as coffee, tea, or soft drinks. °· Avoid alcohol. °What foods are recommended? °The items listed may not be a complete list. Talk with your health care provider about what dietary choices are best for you. °Grains  °White rice. White, French, or pita breads (fresh or toasted), including plain rolls, buns, or bagels. White pasta. Saltine, soda, or graham crackers. Pretzels. Low-fiber cereal. Cooked cereals made with water (such as cornmeal, farina, or cream cereals). Plain muffins. Matzo. Melba toast. Zwieback. °Vegetables  °Potatoes (without the skin). Most well-cooked and canned vegetables without skins or seeds. Tender lettuce. °Fruits  °Apple sauce. Fruits canned in juice. Cooked apricots, cherries, grapefruit, peaches, pears, or plums. Fresh bananas and cantaloupe. °Meats and other protein foods  °Baked or boiled chicken. Eggs. Tofu. Fish. Seafood. Smooth nut butters. Ground or well-cooked tender beef, ham, veal, lamb, pork, or poultry. °Dairy  °Plain yogurt, kefir, and unsweetened liquid yogurt. Lactose-free milk, buttermilk, skim milk, or soy milk. Low-fat or nonfat hard cheese. °Beverages  °Water. Low-calorie sports drinks. Fruit juices without pulp. Strained tomato and vegetable juices. Decaffeinated teas. Sugar-free beverages not sweetened with sugar alcohols. Oral rehydration solutions, if approved by your health care provider. °Seasoning and other foods  °Bouillon, broth, or soups made from recommended foods. °What foods are not recommended? °The items listed may not be a complete list. Talk with your health care provider about what dietary choices   are best for you. °Grains  °Whole grain, whole wheat, bran, or rye breads, rolls, pastas, and crackers. Wild or brown rice. Whole grain or bran cereals. Barley.  Oats and oatmeal. Corn tortillas or taco shells. Granola. Popcorn. °Vegetables  °Raw vegetables. Fried vegetables. Cabbage, broccoli, Brussels sprouts, artichokes, baked beans, beet greens, corn, kale, legumes, peas, sweet potatoes, and yams. Potato skins. Cooked spinach and cabbage. °Fruits  °Dried fruit, including raisins and dates. Raw fruits. Stewed or dried prunes. Canned fruits with syrup. °Meat and other protein foods  °Fried or fatty meats. Deli meats. Chunky nut butters. Nuts and seeds. Beans and lentils. Bacon. Hot dogs. Sausage. °Dairy  °High-fat cheeses. Whole milk, chocolate milk, and beverages made with milk, such as milk shakes. Half-and-half. Cream. sour cream. Ice cream. °Beverages  °Caffeinated beverages (such as coffee, tea, soda, or energy drinks). Alcoholic beverages. Fruit juices with pulp. Prune juice. Soft drinks sweetened with high-fructose corn syrup or sugar alcohols. High-calorie sports drinks. °Fats and oils  °Butter. Cream sauces. Margarine. Salad oils. Plain salad dressings. Olives. Avocados. Mayonnaise. °Sweets and desserts  °Sweet rolls, doughnuts, and sweet breads. Sugar-free desserts sweetened with sugar alcohols such as xylitol and sorbitol. °Seasoning and other foods  °Honey. Hot sauce. Chili powder. Gravy. Cream-based or milk-based soups. Pancakes and waffles. °Summary °· When you have diarrhea, the foods you eat and your eating habits are very important. °· Make sure you get at least 8-10 cups of fluid each day, or enough to keep your urine clear or pale yellow. °· Eat bland foods and gradually reintroduce healthy, nutrient-rich foods as tolerated, or as told by your health care provider. °· Avoid high-fiber, fried, greasy, or spicy foods. °This information is not intended to replace advice given to you by your health care provider. Make sure you discuss any questions you have with your health care provider. °Document Released: 05/14/2003 Document Revised: 02/19/2016 Document  Reviewed: 02/19/2016 °Elsevier Interactive Patient Education © 2017 [employer] Inc[employer] °Norovirus Infection °A norovirus infection is caused by exposure to a virus in a group of similar viruses (noroviruses). This type of infection causes inflammation in your stomach and intestines (gastroenteritis). Norovirus is the most common cause of gastroenteritis. It also causes food poisoning. °Anyone can get a norovirus infection. It spreads very easily (contagious). You can get it from contaminated food, water, surfaces, or other people. Norovirus is found in the stool or vomit of infected people. You can spread the infection as soon as you feel sick until 2 weeks after you recover. °Symptoms usually begin within 2 days after you become infected. Most norovirus symptoms affect the digestive system. °What are the causes? °Norovirus infection is caused by contact with norovirus. You can catch norovirus if you: °· Eat or drink something contaminated with norovirus. °· Touch surfaces or objects contaminated with norovirus and then put your hand in your mouth. °· Have direct contact with an infected person who has symptoms. °· Share food, drink, or utensils with someone with who is sick with norovirus. °What are the signs or symptoms? °Symptoms of norovirus may include: °· Nausea. °· Vomiting. °· Diarrhea. °· Stomach cramps. °· Fever. °· Chills. °· Headache. °· Muscle aches. °· Tiredness. °How is this diagnosed? °Your health care provider may suspect norovirus based on your symptoms and physical exam. Your health care provider may also test a sample of your stool or vomit for the virus. °How is this treated? °There is no specific treatment for norovirus. Most people get better without treatment in   about 2 days. °Follow these instructions at home: °· Replace lost fluids by drinking plenty of water or rehydration fluids containing important minerals called electrolytes. This prevents dehydration. Drink enough fluid to keep your urine  clear or pale yellow. °· Do not prepare food for others while you are infected. Wait at least 3 days after recovering from the illness to do that. °How is this prevented? °· Wash your hands often, especially after using the toilet or changing a diaper. °· Wash fruits and vegetables thoroughly before preparing or serving them. °· Throw out any food that a sick person may have touched. °· Disinfect contaminated surfaces immediately after someone in the household has been sick. Use a bleach-based household cleaner. °· Immediately remove and wash soiled clothes or sheets. °Contact a health care provider if: °· Your vomiting, diarrhea, and stomach pain is getting worse. °· Your symptoms of norovirus do not go away after 2-3 days. °Get help right away if: °You develop symptoms of dehydration that do not improve with fluid replacement. This may include: °· Excessive sleepiness. °· Lack of tears. °· Dry mouth. °· Dizziness when standing. °· Weak pulse. °This information is not intended to replace advice given to you by your health care provider. Make sure you discuss any questions you have with your health care provider. °Document Released: 05/14/2002 Document Revised: 07/30/2015 Document Reviewed: 08/01/2013 °Elsevier Interactive Patient Education © 2017 Elsevier Inc. ° °

## 2016-06-05 NOTE — MAU Note (Signed)
Having diarrhea since late Friday night. Nauseated. Vomited twice Sat. Denies any vag bleeding or LOF. Some mid back pain after vomited last time.

## 2016-06-05 NOTE — Progress Notes (Signed)
Written and verbal d/c instructions given and understanding voiced. 

## 2016-06-09 ENCOUNTER — Ambulatory Visit (HOSPITAL_COMMUNITY)
Admission: RE | Admit: 2016-06-09 | Discharge: 2016-06-09 | Disposition: A | Discharge status: Home / Self Care | Payer: Medicaid Other | Source: Ambulatory Visit | Attending: Certified Nurse Midwife | Admitting: Certified Nurse Midwife

## 2016-06-09 ENCOUNTER — Other Ambulatory Visit: Payer: Self-pay | Admitting: Certified Nurse Midwife

## 2016-06-09 DIAGNOSIS — Z3490 Encounter for supervision of normal pregnancy, unspecified, unspecified trimester: Secondary | ICD-10-CM

## 2016-06-09 DIAGNOSIS — Z3A24 24 weeks gestation of pregnancy: Secondary | ICD-10-CM | POA: Insufficient documentation

## 2016-06-09 DIAGNOSIS — E669 Obesity, unspecified: Secondary | ICD-10-CM | POA: Insufficient documentation

## 2016-06-09 DIAGNOSIS — Z362 Encounter for other antenatal screening follow-up: Secondary | ICD-10-CM | POA: Diagnosis present

## 2016-06-09 DIAGNOSIS — O99212 Obesity complicating pregnancy, second trimester: Secondary | ICD-10-CM | POA: Insufficient documentation

## 2016-06-09 DIAGNOSIS — Z3400 Encounter for supervision of normal first pregnancy, unspecified trimester: Secondary | ICD-10-CM

## 2016-06-20 ENCOUNTER — Other Ambulatory Visit (HOSPITAL_COMMUNITY)
Admission: RE | Admit: 2016-06-20 | Discharge: 2016-06-20 | Disposition: A | Discharge status: Home / Self Care | Payer: Medicaid Other | Source: Ambulatory Visit | Attending: Certified Nurse Midwife | Admitting: Certified Nurse Midwife

## 2016-06-20 ENCOUNTER — Ambulatory Visit (INDEPENDENT_AMBULATORY_CARE_PROVIDER_SITE_OTHER): Payer: Medicaid Other | Admitting: Certified Nurse Midwife

## 2016-06-20 VITALS — BP 119/79 | HR 88 | Wt 298.0 lb

## 2016-06-20 DIAGNOSIS — A5901 Trichomonal vulvovaginitis: Secondary | ICD-10-CM

## 2016-06-20 DIAGNOSIS — Z3A00 Weeks of gestation of pregnancy not specified: Secondary | ICD-10-CM | POA: Diagnosis not present

## 2016-06-20 DIAGNOSIS — O23592 Infection of other part of genital tract in pregnancy, second trimester: Secondary | ICD-10-CM

## 2016-06-20 DIAGNOSIS — O09892 Supervision of other high risk pregnancies, second trimester: Secondary | ICD-10-CM | POA: Diagnosis not present

## 2016-06-20 DIAGNOSIS — O99212 Obesity complicating pregnancy, second trimester: Secondary | ICD-10-CM

## 2016-06-20 DIAGNOSIS — Z3400 Encounter for supervision of normal first pregnancy, unspecified trimester: Secondary | ICD-10-CM

## 2016-06-20 DIAGNOSIS — Z3402 Encounter for supervision of normal first pregnancy, second trimester: Secondary | ICD-10-CM

## 2016-06-20 DIAGNOSIS — Z6841 Body Mass Index (BMI) 40.0 and over, adult: Secondary | ICD-10-CM

## 2016-06-20 NOTE — Patient Instructions (Signed)
AREA PEDIATRIC/FAMILY PRACTICE PHYSICIANS  [facility] [address], [address] [place], [place]  [address] Phone - [phone]   Fax - [phone]  [facility] [address] [place], [place] [address] Phone - [phone]   Fax - [phone]  [facility] [address] [place], [place]  [address] Phone - [phone]   Fax - [phone]  [facility] [address] [place], [place]  [address] Phone - [phone]   Fax - [phone]  [facility] [address] [place], [place]  [address] Phone - [phone]   Fax - [phone]  [facility] [address] [place], [place]  [address] Phone - [phone]   Fax - [phone]  [facility] [address] [place], [place]  [address] Phone - [phone]   Fax - [phone]  [facility] [address] [place], [place]  [address] Phone - [phone]   Fax - [phone]  [facility] [address] [place], [place]  [address] Phone - [phone]   Fax - [phone] [facility] [address] [place], [place]  [address] Phone - [phone]   Fax - [phone]  [facility] [address] [place], [place]  [address] Phone - [phone]   Fax - [phone]  [facility] [address] [place], [place]  [address] Phone - [phone]   Fax - [phone]  [facility] [address] [place], [place]  [address] Phone - [phone]   Fax - [phone]  [facility] [address] [place], [place]  [address] Phone - [phone]  [facility] [address] [place], [place]  [address] Phone - [phone]   Fax - [phone]  [facility] [address] [place], [place]  [address] Phone - [phone]   Fax - [phone]  [facility] [address] [place], [place]  [address] Phone - [phone]   Fax - [phone]  [facility] [address] [place], [place]  [address] Phone - [phone]   Fax - [phone]   [facility] [address] [place], [place]  [address] Phone - [phone]   Fax - [phone]  [facility] [address] [place], [place]  [address] Phone - [phone]   Fax - [phone]  [facility] [address] [place], [place]  [address] Phone - [phone]   Fax - [phone]  [facility] [address] [place], [place]  [address] Phone - [phone]   Fax - [phone]  [facility] [address] [place], [place]  [address] Phone - [phone]   Fax - [phone] [facility] [address] [place], [place]  [address] Phone - [phone]   Fax - [phone]  [facility] [address] [place], [place]  [address] Phone - [phone]   Fax - [phone]  [facility] [address] [place],   [address] Phone - [phone]   Fax - [phone]  [facility] Charlene Flemming MD [address] [place]  [address] Phone [phone]  Fax [phone]   

## 2016-06-20 NOTE — Progress Notes (Signed)
   PRENATAL VISIT NOTE  SubjectiveKARRAH MANGINI Goodman is a 26 y.o. G1P0 at [redacted]w[redacted]d being seen today for ongoing prenatal care.  She is currently monitored for the following issues for this low-risk pregnancy and has Asthma, chronic; Morbid obesity; Encounter for supervision of normal pregnancy, antepartum; Genital herpes complicating pregnancy; Obesity complicating pregnancy; Trichomonal vaginitis during pregnancy in second trimester; Nausea and vomiting during pregnancy prior to [redacted] weeks gestation; Heartburn during pregnancy in second trimester; and Morbid obesity with BMI of 50.0-59.9, adult (HCC) on her problem list.  Patient reports no complaints.  Contractions: Not present. Vag. Bleeding: None.  Movement: Present. Denies leaking of fluid.   The following portions of the patient's history were reviewed and updated as appropriate: allergies, current medications, past family history, past medical history, past social history, past surgical history and problem list. Problem list updated.  Objective:   Vitals:   06/20/16 0917  BP: 119/79  Pulse: 88  Weight: 298 lb (135.2 kg)    Fetal Status: Fetal Heart Rate (bpm): 150 Fundal Height: 28 cm Movement: Present     General:  Alert, oriented and cooperative. Patient is in no acute distress.  Skin: Skin is warm and dry. No rash noted.   Cardiovascular: Normal heart rate noted  Respiratory: Normal respiratory effort, no problems with respiration noted  Abdomen: Soft, gravid, appropriate for gestational age. Pain/Pressure: Absent     Pelvic:  Cervical exam deferred        Extremities: Normal range of motion.  Edema: Trace  Mental Status: Normal mood and affect. Normal behavior. Normal judgment and thought content.   Assessment and Plan:  Pregnancy: G1P0 at [redacted]w[redacted]d  1. Morbid obesity with BMI of 50.0-59.9, adult (HCC)      BMI: 57%, Total weight loss this pregnancy 7 lbs.    2. Supervision of normal first pregnancy, antepartum     Doing well.   F/U growth [name] scheduled in the beginning of May.  EFW: 71%. Hx of Trich this pregnancy: TOC today.   Preterm labor symptoms and general obstetric precautions including but not limited to vaginal bleeding, contractions, leaking of fluid and fetal movement were reviewed in detail with the patient. Please refer to After Visit Summary for other counseling recommendations.  Return in about 2 weeks (around 07/04/2016) for ROB, 2 hr OGTT.   Roe Coombs, CNM

## 2016-06-21 LAB — CERVICOVAGINAL ANCILLARY ONLY: Trichomonas: POSITIVE — AB

## 2016-06-22 ENCOUNTER — Other Ambulatory Visit: Payer: Self-pay | Admitting: Certified Nurse Midwife

## 2016-06-22 DIAGNOSIS — O23592 Infection of other part of genital tract in pregnancy, second trimester: Principal | ICD-10-CM

## 2016-06-22 DIAGNOSIS — A5901 Trichomonal vulvovaginitis: Secondary | ICD-10-CM

## 2016-06-22 MED ORDER — TINIDAZOLE 500 MG PO TABS: 2000.0000 mg | ORAL_TABLET | Freq: Once | ORAL | 0 refills | Status: AC

## 2016-06-23 ENCOUNTER — Other Ambulatory Visit: Payer: Self-pay | Admitting: Certified Nurse Midwife

## 2016-06-23 DIAGNOSIS — A599 Trichomoniasis, unspecified: Secondary | ICD-10-CM

## 2016-06-23 MED ORDER — CLOTRIMAZOLE 2 % VA CREA: 1.0000 | TOPICAL_CREAM | Freq: Every day | VAGINAL | 0 refills | Status: DC

## 2016-06-27 ENCOUNTER — Telehealth: Payer: Self-pay | Admitting: Allergy & Immunology

## 2016-06-27 NOTE — Telephone Encounter (Signed)
Left message that pregnancy MCD does not cover our procdeures - she called me back & wanted to cancel her appointment. - kt

## 2016-06-27 NOTE — Telephone Encounter (Signed)
patient states they have pregnancy medicaid - - NOT family planning - wants to know if upcoming appt will be covered?  The insurance in [employer] is standard medicaid and has been verified Please call patient to answer any questions

## 2016-06-28 ENCOUNTER — Encounter: Payer: Self-pay | Admitting: Certified Nurse Midwife

## 2016-06-28 ENCOUNTER — Ambulatory Visit (INDEPENDENT_AMBULATORY_CARE_PROVIDER_SITE_OTHER): Payer: Medicaid Other | Admitting: Certified Nurse Midwife

## 2016-06-28 VITALS — BP 117/69 | HR 96 | Wt 298.6 lb

## 2016-06-28 DIAGNOSIS — A6009 Herpesviral infection of other urogenital tract: Secondary | ICD-10-CM

## 2016-06-28 DIAGNOSIS — O98312 Other infections with a predominantly sexual mode of transmission complicating pregnancy, second trimester: Secondary | ICD-10-CM

## 2016-06-28 DIAGNOSIS — Z6841 Body Mass Index (BMI) 40.0 and over, adult: Secondary | ICD-10-CM

## 2016-06-28 DIAGNOSIS — O99212 Obesity complicating pregnancy, second trimester: Secondary | ICD-10-CM

## 2016-06-28 DIAGNOSIS — Z3402 Encounter for supervision of normal first pregnancy, second trimester: Secondary | ICD-10-CM

## 2016-06-28 DIAGNOSIS — Z3400 Encounter for supervision of normal first pregnancy, unspecified trimester: Secondary | ICD-10-CM

## 2016-06-28 DIAGNOSIS — O1202 Gestational edema, second trimester: Secondary | ICD-10-CM

## 2016-06-28 NOTE — Progress Notes (Signed)
Patient complains of swelling in her feet, she was told by Erskine Squibb to come in.

## 2016-06-28 NOTE — Progress Notes (Signed)
   PRENATAL VISIT NOTE  Subjective:  Alyssa Goodman is a 26 y.o. G1P0 at 49w0dbeing seen today for ongoing prenatal care.  She is currently monitored for the following issues for this low-risk pregnancy and has Asthma, chronic; Morbid obesity; Encounter for supervision of normal pregnancy, antepartum; Genital herpes complicating pregnancy; Obesity complicating pregnancy; Trichomonal vaginitis during pregnancy in second trimester; Nausea and vomiting during pregnancy prior to [redacted] weeks gestation; Heartburn during pregnancy in second trimester; and Morbid obesity with BMI of 50.0-59.9, adult ([place] on her problem list.  Patient reports no bleeding, no contractions, no cramping, no leaking and no headache, no upper right gastric pain, states that she woke up with increased edema in her feet and that it is sore when she goes down the stairs at her house.  Contractions: Not present. Vag. Bleeding: None.  Movement: Present. Denies leaking of fluid.   The following portions of the patient's history were reviewed and updated as appropriate: allergies, current medications, past family history, past medical history, past social history, past surgical history and problem list. Problem list updated.  Objective:   Vitals:   06/28/16 1414  BP: 117/69  Pulse: 96  Weight: 298 lb 9.6 oz (135.4 kg)    Fetal Status: Fetal Heart Rate (bpm): 138   Movement: Present     General:  Alert, oriented and cooperative. Patient is in no acute distress.  Skin: Skin is warm and dry. No rash noted.   Cardiovascular: Normal heart rate noted  Respiratory: Normal respiratory effort, no problems with respiration noted  Abdomen: Soft, gravid, appropriate for gestational age. Pain/Pressure: Absent     Pelvic:  Cervical exam deferred        Extremities: Normal range of motion.  Edema: Mild pitting, slight indentation  Mental Status: Normal mood and affect. Normal behavior. Normal judgment and thought content.   Assessment and  Plan:  Pregnancy: G1P0 at 263w0d1. Genital herpes affecting pregnancy in second trimester    Valtrex '@36'$  weeks  2. Supervision of normal first pregnancy, antepartum      Bilateral lower leg edema most likely physiologic from the pregnancy  3. Morbid obesity with BMI of 50.0-59.9, adult ([facility]    Has lost 6 pounds this pregnancy, discussed low salt diet and elevation of extremities.    4. Edema during pregnancy in second trimester    Baseline labs as a precaution ordered even though patient is normal tensive.  - Protein / creatinine ratio, urine - Comp Met (CMET)  Preterm labor symptoms and general obstetric precautions including but not limited to vaginal bleeding, contractions, leaking of fluid and fetal movement were reviewed in detail with the patient. Please refer to After Visit Summary for other counseling recommendations.  Return for ROB as scheduled previously.   RaMorene CrockerCNM

## 2016-06-29 LAB — COMPREHENSIVE METABOLIC PANEL
ALBUMIN: 3.2 g/dL — AB (ref 3.5–5.5)
ALK PHOS: 59 IU/L (ref 39–117)
ALT: 11 IU/L (ref 0–32)
AST: 13 IU/L (ref 0–40)
Albumin/Globulin Ratio: 1.1 — ABNORMAL LOW (ref 1.2–2.2)
BUN / CREAT RATIO: 9 (ref 9–23)
BUN: 6 mg/dL (ref 6–20)
Bilirubin Total: 0.2 mg/dL (ref 0.0–1.2)
CO2: 21 mmol/L (ref 18–29)
CREATININE: 0.68 mg/dL (ref 0.57–1.00)
Calcium: 8.6 mg/dL — ABNORMAL LOW (ref 8.7–10.2)
Chloride: 100 mmol/L (ref 96–106)
GFR calc Af Amer: 141 mL/min/{1.73_m2} (ref >59)
GFR calc non Af Amer: 122 mL/min/{1.73_m2} (ref >59)
GLUCOSE: 79 mg/dL (ref 65–99)
Globulin, Total: 2.8 g/dL (ref 1.5–4.5)
Potassium: 4.2 mmol/L (ref 3.5–5.2)
Sodium: 138 mmol/L (ref 134–144)
Total Protein: 6 g/dL (ref 6.0–8.5)

## 2016-06-29 LAB — PROTEIN / CREATININE RATIO, URINE
CREATININE, UR: 147.5 mg/dL
Protein, Ur: 40.2 mg/dL
Protein/Creat Ratio: 273 mg/g creat — ABNORMAL HIGH (ref 0–200)

## 2016-06-30 ENCOUNTER — Ambulatory Visit: Payer: Self-pay | Admitting: Allergy & Immunology

## 2016-07-04 ENCOUNTER — Ambulatory Visit (INDEPENDENT_AMBULATORY_CARE_PROVIDER_SITE_OTHER): Payer: Medicaid Other | Admitting: Certified Nurse Midwife

## 2016-07-04 ENCOUNTER — Other Ambulatory Visit: Payer: Medicaid Other

## 2016-07-04 VITALS — BP 129/79 | HR 96 | Wt 295.0 lb

## 2016-07-04 DIAGNOSIS — Z23 Encounter for immunization: Secondary | ICD-10-CM

## 2016-07-04 DIAGNOSIS — J4520 Mild intermittent asthma, uncomplicated: Secondary | ICD-10-CM

## 2016-07-04 DIAGNOSIS — O98312 Other infections with a predominantly sexual mode of transmission complicating pregnancy, second trimester: Secondary | ICD-10-CM

## 2016-07-04 DIAGNOSIS — O99013 Anemia complicating pregnancy, third trimester: Secondary | ICD-10-CM

## 2016-07-04 DIAGNOSIS — Z3403 Encounter for supervision of normal first pregnancy, third trimester: Secondary | ICD-10-CM

## 2016-07-04 DIAGNOSIS — Z3400 Encounter for supervision of normal first pregnancy, unspecified trimester: Secondary | ICD-10-CM

## 2016-07-04 DIAGNOSIS — A6009 Herpesviral infection of other urogenital tract: Secondary | ICD-10-CM

## 2016-07-04 DIAGNOSIS — O98313 Other infections with a predominantly sexual mode of transmission complicating pregnancy, third trimester: Secondary | ICD-10-CM

## 2016-07-04 NOTE — Progress Notes (Signed)
Patient presents for ROB/GTT.  TDAP given, patient tolerated well.

## 2016-07-05 LAB — GLUCOSE TOLERANCE, 2 HOURS W/ 1HR
GLUCOSE, 1 HOUR: 129 mg/dL (ref 65–179)
GLUCOSE, FASTING: 88 mg/dL (ref 65–91)
Glucose, 2 hour: 93 mg/dL (ref 65–152)

## 2016-07-05 LAB — CBC
Hematocrit: 36.9 % (ref 34.0–46.6)
Hemoglobin: 11.5 g/dL (ref 11.1–15.9)
MCH: 26.6 pg (ref 26.6–33.0)
MCHC: 31.2 g/dL — AB (ref 31.5–35.7)
MCV: 85 fL (ref 79–97)
PLATELETS: 231 10*3/uL (ref 150–379)
RBC: 4.33 x10E6/uL (ref 3.77–5.28)
RDW: 15.9 % — ABNORMAL HIGH (ref 12.3–15.4)
WBC: 10.8 10*3/uL (ref 3.4–10.8)

## 2016-07-05 LAB — HIV ANTIBODY (ROUTINE TESTING W REFLEX): HIV SCREEN 4TH GENERATION: NONREACTIVE

## 2016-07-05 LAB — RPR: RPR Ser Ql: NONREACTIVE

## 2016-07-05 NOTE — Progress Notes (Signed)
   PRENATAL VISIT NOTE  Subjective:  Alyssa Goodman is a 26 y.o. G1P0 at [redacted]w[redacted]d being seen today for ongoing prenatal care.  She is currently monitored for the following issues for this low-risk pregnancy and has Asthma, chronic; Morbid obesity; Encounter for supervision of normal pregnancy, antepartum; Genital herpes complicating pregnancy; Obesity complicating pregnancy; Trichomonal vaginitis during pregnancy in second trimester; Nausea and vomiting during pregnancy prior to [redacted] weeks gestation; Heartburn during pregnancy in second trimester; and Morbid obesity with BMI of 50.0-59.9, adult (HCC) on her problem list.  Patient reports no complaints.  Contractions: Not present. Vag. Bleeding: None.  Movement: Present. Denies leaking of fluid.   The following portions of the patient's history were reviewed and updated as appropriate: allergies, current medications, past family history, past medical history, past social history, past surgical history and problem list. Problem list updated.  Objective:   Vitals:   07/04/16 0957  BP: 129/79  Pulse: 96  Weight: 295 lb (133.8 kg)    Fetal Status: Fetal Heart Rate (bpm): 141 Fundal Height: 30 cm Movement: Present     General:  Alert, oriented and cooperative. Patient is in no acute distress.  Skin: Skin is warm and dry. No rash noted.   Cardiovascular: Normal heart rate noted  Respiratory: Normal respiratory effort, no problems with respiration noted  Abdomen: Soft, gravid, appropriate for gestational age. Pain/Pressure: Absent     Pelvic:  Cervical exam deferred        Extremities: Normal range of motion.  Edema: Trace  Mental Status: Normal mood and affect. Normal behavior. Normal judgment and thought content.   Assessment and Plan:  Pregnancy: G1P0 at [redacted]w[redacted]d  1. Supervision of normal first pregnancy, antepartum       - Glucose Tolerance, 2 Hours w/1 Hour - CBC - HIV antibody (with reflex) - RPR - Tdap vaccine greater than or equal to  7yo IM  2. Mild intermittent chronic asthma without complication     Has albuterol  3. Genital herpes affecting pregnancy in second trimester     Valtrex  weeks  4. Morbid obesity     Has lost 10lbs this pregnancy  Preterm labor symptoms and general obstetric precautions including but not limited to vaginal bleeding, contractions, leaking of fluid and fetal movement were reviewed in detail with the patient. Please refer to After Visit Summary for other counseling recommendations.  Return in about 2 weeks (around 07/18/2016) for ROB.   Roe Coombs, CNM

## 2016-07-07 ENCOUNTER — Other Ambulatory Visit: Payer: Self-pay | Admitting: Certified Nurse Midwife

## 2016-07-07 DIAGNOSIS — Z3400 Encounter for supervision of normal first pregnancy, unspecified trimester: Secondary | ICD-10-CM

## 2016-07-13 ENCOUNTER — Ambulatory Visit (HOSPITAL_COMMUNITY)
Admission: RE | Admit: 2016-07-13 | Discharge: 2016-07-13 | Disposition: A | Discharge status: Home / Self Care | Payer: Medicaid Other | Source: Ambulatory Visit | Attending: Certified Nurse Midwife | Admitting: Certified Nurse Midwife

## 2016-07-13 DIAGNOSIS — Z3400 Encounter for supervision of normal first pregnancy, unspecified trimester: Secondary | ICD-10-CM | POA: Diagnosis present

## 2016-07-13 DIAGNOSIS — O99212 Obesity complicating pregnancy, second trimester: Secondary | ICD-10-CM | POA: Diagnosis not present

## 2016-07-13 DIAGNOSIS — Z362 Encounter for other antenatal screening follow-up: Secondary | ICD-10-CM | POA: Diagnosis not present

## 2016-07-13 DIAGNOSIS — Z3A29 29 weeks gestation of pregnancy: Secondary | ICD-10-CM | POA: Insufficient documentation

## 2016-07-13 DIAGNOSIS — Z6841 Body Mass Index (BMI) 40.0 and over, adult: Secondary | ICD-10-CM | POA: Insufficient documentation

## 2016-07-15 ENCOUNTER — Inpatient Hospital Stay (HOSPITAL_COMMUNITY)
Admission: AD | Admit: 2016-07-15 | Discharge: 2016-07-15 | Disposition: A | Discharge status: Home / Self Care | Payer: Medicaid Other | Source: Ambulatory Visit | Attending: Family Medicine | Admitting: Family Medicine

## 2016-07-15 ENCOUNTER — Encounter (HOSPITAL_COMMUNITY): Payer: Self-pay | Admitting: *Deleted

## 2016-07-15 DIAGNOSIS — Z888 Allergy status to other drugs, medicaments and biological substances status: Secondary | ICD-10-CM | POA: Insufficient documentation

## 2016-07-15 DIAGNOSIS — Z79899 Other long term (current) drug therapy: Secondary | ICD-10-CM | POA: Insufficient documentation

## 2016-07-15 DIAGNOSIS — Z833 Family history of diabetes mellitus: Secondary | ICD-10-CM | POA: Insufficient documentation

## 2016-07-15 DIAGNOSIS — R109 Unspecified abdominal pain: Secondary | ICD-10-CM | POA: Diagnosis not present

## 2016-07-15 DIAGNOSIS — O99213 Obesity complicating pregnancy, third trimester: Secondary | ICD-10-CM | POA: Insufficient documentation

## 2016-07-15 DIAGNOSIS — O9989 Other specified diseases and conditions complicating pregnancy, childbirth and the puerperium: Secondary | ICD-10-CM | POA: Diagnosis not present

## 2016-07-15 DIAGNOSIS — O99513 Diseases of the respiratory system complicating pregnancy, third trimester: Secondary | ICD-10-CM | POA: Insufficient documentation

## 2016-07-15 DIAGNOSIS — Z87891 Personal history of nicotine dependence: Secondary | ICD-10-CM | POA: Diagnosis not present

## 2016-07-15 DIAGNOSIS — R102 Pelvic and perineal pain: Secondary | ICD-10-CM | POA: Insufficient documentation

## 2016-07-15 DIAGNOSIS — Z800 Family history of malignant neoplasm of digestive organs: Secondary | ICD-10-CM | POA: Diagnosis not present

## 2016-07-15 DIAGNOSIS — Z3A29 29 weeks gestation of pregnancy: Secondary | ICD-10-CM | POA: Insufficient documentation

## 2016-07-15 DIAGNOSIS — Z91013 Allergy to seafood: Secondary | ICD-10-CM | POA: Diagnosis not present

## 2016-07-15 DIAGNOSIS — O26893 Other specified pregnancy related conditions, third trimester: Secondary | ICD-10-CM | POA: Diagnosis not present

## 2016-07-15 DIAGNOSIS — Z6841 Body Mass Index (BMI) 40.0 and over, adult: Secondary | ICD-10-CM

## 2016-07-15 DIAGNOSIS — J45909 Unspecified asthma, uncomplicated: Secondary | ICD-10-CM | POA: Diagnosis not present

## 2016-07-15 DIAGNOSIS — Z8249 Family history of ischemic heart disease and other diseases of the circulatory system: Secondary | ICD-10-CM | POA: Diagnosis not present

## 2016-07-15 DIAGNOSIS — N949 Unspecified condition associated with female genital organs and menstrual cycle: Secondary | ICD-10-CM

## 2016-07-15 LAB — URINALYSIS, ROUTINE W REFLEX MICROSCOPIC
Bilirubin Urine: NEGATIVE
Glucose, UA: NEGATIVE mg/dL
Hgb urine dipstick: NEGATIVE
KETONES UR: 5 mg/dL — AB
Leukocytes, UA: NEGATIVE
Nitrite: NEGATIVE
PROTEIN: 30 mg/dL — AB
Specific Gravity, Urine: 1.024 (ref 1.005–1.030)
pH: 6 (ref 5.0–8.0)

## 2016-07-15 MED ORDER — CYCLOBENZAPRINE HCL 10 MG PO TABS: 10.0000 mg | ORAL_TABLET | Freq: Two times a day (BID) | ORAL | 0 refills | Status: DC | PRN

## 2016-07-15 MED ORDER — ACETAMINOPHEN 500 MG PO TABS
1000.0000 mg | ORAL_TABLET | Freq: Once | ORAL | Status: AC
  Administered 2016-07-15: 1000 mg via ORAL
  Filled 2016-07-15: qty 2

## 2016-07-15 NOTE — MAU Note (Signed)
Pt reports onset of left lower abd pain x 40 minutes ago. Denies bleeding or ROM, denies problems with pregnancy. Denies N,V,D or fever

## 2016-07-15 NOTE — MAU Provider Note (Signed)
History     CSN: [id number]  Arrival date and time: 07/15/16 1649   First Provider Initiated Contact with Patient 07/15/16 1713      Chief Complaint  Patient presents with  . Abdominal Pain   Alyssa Goodman is a 26 y.o. G1P0 at [redacted]w[redacted]d presents via EMS with 1 hour history acute onset sharp left groin pain which is so severe she has difficulty walking. She describes discomfort similar to menstrual cramping which has been unchanged throughout pregnancy. The pain radiates to her low back. It is relieved by lying down. No leakage of fluid or vaginal bleeding. Good fetal movement.    [facility]atal [facility] at [facility] complicated by morbid obesity and Trichomonas vaginitis for which she is currently being treated vaginally. 2 hour OGTT normal.  Past Medical History:  Diagnosis Date  . Asthma   . Gallstones   . Obesity   . Trichimoniasis     Past Surgical History:  Procedure Laterality Date  . NO PAST SURGERIES      Family History  Problem Relation Age of Onset  . Hypertension Mother   . Diabetes Maternal Uncle   . Hypertension Maternal Uncle   . Diabetes Maternal Grandmother   . Hypertension Maternal Grandmother   . Heart disease Maternal Grandmother   . Cancer Maternal Grandfather        colon    Social History  Substance Use Topics  . Smoking status: Former Smoker    Types: Cigarettes  . Smokeless tobacco: Never Used     Comment: stopped with +UPT  . Alcohol use No    Allergies:  Allergies  Allergen Reactions  . Metronidazole Shortness Of Breath and Rash  . Kiwi Extract Itching  . Shellfish Allergy     Mostly oysters and limited to number of shrimp.    Prescriptions Prior to Admission  Medication Sig Dispense Refill Last Dose  . acetaminophen (TYLENOL) 325 MG tablet Take 650 mg by mouth every 6 (six) hours as needed for mild pain or headache.   07/14/2016 at Unknown time  . Prenatal Vit-Fe Fumarate-FA (PREPLUS) 27-1 MG TABS Take 1 tablet by mouth daily. 30 tablet 12  07/14/2016 at Unknown time  . ondansetron (ZOFRAN ODT) 4 MG disintegrating tablet Take 1 tablet (4 mg total) by mouth every 8 (eight) hours as needed for nausea or vomiting. (Patient not taking: Reported on 06/28/2016) 20 tablet 0 Not Taking    Review of Systems  Constitutional: Negative for fever.  Gastrointestinal: Positive for abdominal pain. Negative for constipation, diarrhea, nausea, rectal pain and vomiting.  Genitourinary: Positive for pelvic pain. Negative for dysuria, flank pain, genital sores, vaginal bleeding, vaginal discharge and vaginal pain.  Musculoskeletal: Negative for back pain.  Neurological: Negative for light-headedness.  Psychiatric/Behavioral: The patient is nervous/anxious.    Physical Exam   Blood pressure 133/65, pulse 96, temperature 97.5 F (36.4 C), temperature source Oral, resp. rate (!) 22, last menstrual period 12/22/2015, SpO2 99 %.  Physical Exam  Nursing note and vitals reviewed. Constitutional: She is oriented to person, place, and time. She appears well-developed and well-nourished. No distress.  HENT:  Head: Normocephalic.  Eyes: No scleral icterus.  Neck: Normal range of motion.  Cardiovascular: Normal rate.   Respiratory: Effort normal.  GI: There is tenderness.  Mild left groin pain on palpation.   Genitourinary: Vaginal discharge found.  Genitourinary Comments: Large amount homogenous white discharge. Cervix is long/closed/high.  Musculoskeletal: She exhibits tenderness.  No CVAT Minimal low mid back  paraspinous tenderness  Neurological: She is alert and oriented to person, place, and time.  Skin: Skin is warm and dry.  Psychiatric: She has a normal mood and affect. Her behavior is normal.    MAU Course  Procedures Acetaminophen 1000mg  po I&O cath: Results for orders placed or performed during the hospital encounter of 07/15/16 (from the past 24 hour(s))  Urinalysis, Routine w reflex microscopic     Status: Abnormal   Collection  Time: 07/15/16  5:30 PM  Result Value Ref Range   Color, Urine YELLOW YELLOW   APPearance CLEAR CLEAR   Specific Gravity, Urine 1.024 1.005 - 1.030   pH 6.0 5.0 - 8.0   Glucose, UA NEGATIVE NEGATIVE mg/dL   Hgb urine dipstick NEGATIVE NEGATIVE   Bilirubin Urine NEGATIVE NEGATIVE   Ketones, ur 5 (A) NEGATIVE mg/dL   Protein, ur 30 (A) NEGATIVE mg/dL   Nitrite NEGATIVE NEGATIVE   Leukocytes, UA NEGATIVE NEGATIVE   RBC / HPF 0-5 0 - 5 RBC/hpf   WBC, UA 0-5 0 - 5 WBC/hpf   Bacteria, UA RARE (A) NONE SEEN   Squamous Epithelial / LPF 0-5 (A) NONE SEEN   Mucous PRESENT   Acetaminophen 1000mg  po given> improved Fetal monitoring: Baseline fetal heart rate 135-140, moderate variability, accelerations present Toco: No contractions  Assessment and Plan  26 yo G1 at 1863w3d 1. Round ligament pain   2. Morbid obesity with BMI of 50.0-59.9, adult (HCC)    Allergies as of 07/15/2016      Reactions   Metronidazole Shortness Of Breath, Rash   Kiwi Extract Itching   Shellfish Allergy    Mostly oysters and limited to number of shrimp.      Medication List    STOP taking these medications   ondansetron 4 MG disintegrating tablet Commonly known as:  ZOFRAN ODT     TAKE these medications   acetaminophen 325 MG tablet Commonly known as:  TYLENOL Take 650 mg by mouth every 6 (six) hours as needed for mild pain or headache.   cyclobenzaprine 10 MG tablet Commonly known as:  FLEXERIL Take 1 tablet (10 mg total) by mouth 2 (two) times daily as needed for muscle spasms.   PREPLUS 27-1 MG Tabs Take 1 tablet by mouth daily.      Follow-up Information    [facility] up on 07/18/2016.   Contact information: [address] [place] [phone]          Ajani Schnieders 07/15/2016, 5:14 PM

## 2016-07-15 NOTE — Discharge Instructions (Signed)
Round Ligament Pain The round ligament is a cord of muscle and tissue that helps to support the uterus. It can become a source of pain during pregnancy if it becomes stretched or twisted as the baby grows. The pain usually begins in the second trimester of pregnancy, and it can come and go until the baby is delivered. It is not a serious problem, and it does not cause harm to the baby. Round ligament pain is usually a short, sharp, and pinching pain, but it can also be a dull, lingering, and aching pain. The pain is felt in the lower side of the abdomen or in the groin. It usually starts deep in the groin and moves up to the outside of the hip area. Pain can occur with:  A sudden change in position.  Rolling over in bed.  Coughing or sneezing.  Physical activity. Follow these instructions at home: Watch your condition for any changes. Take these steps to help with your pain:  When the pain starts, relax. Then try:  Sitting down.  Flexing your knees up to your abdomen.  Lying on your side with one pillow under your abdomen and another pillow between your legs.  Sitting in a warm bath for 15-20 minutes or until the pain goes away.  Take over-the-counter and prescription medicines only as told by your health care provider.  Move slowly when you sit and stand.  Avoid long walks if they cause pain.  Stop or lessen your physical activities if they cause pain. Contact a health care provider if:  Your pain does not go away with treatment.  You feel pain in your back that you did not have before.  Your medicine is not helping. Get help right away if:  You develop a fever or chills.  You develop uterine contractions.  You develop vaginal bleeding.  You develop nausea or vomiting.  You develop diarrhea.  You have pain when you urinate. This information is not intended to replace advice given to you by your health care provider. Make sure you discuss any questions you have with  your health care provider. Document Released: 12/01/2007 Document Revised: 07/30/2015 Document Reviewed: 04/30/2014 Elsevier Interactive Patient Education  2017 Elsevier Inc.  

## 2016-07-17 ENCOUNTER — Other Ambulatory Visit: Payer: Self-pay | Admitting: Certified Nurse Midwife

## 2016-07-17 DIAGNOSIS — Z3400 Encounter for supervision of normal first pregnancy, unspecified trimester: Secondary | ICD-10-CM

## 2016-07-18 ENCOUNTER — Ambulatory Visit (INDEPENDENT_AMBULATORY_CARE_PROVIDER_SITE_OTHER): Payer: Medicaid Other | Admitting: Certified Nurse Midwife

## 2016-07-18 ENCOUNTER — Other Ambulatory Visit (HOSPITAL_COMMUNITY)
Admission: RE | Admit: 2016-07-18 | Discharge: 2016-07-18 | Disposition: A | Discharge status: Home / Self Care | Payer: Medicaid Other | Source: Ambulatory Visit | Attending: Certified Nurse Midwife | Admitting: Certified Nurse Midwife

## 2016-07-18 ENCOUNTER — Encounter: Payer: Self-pay | Admitting: Certified Nurse Midwife

## 2016-07-18 VITALS — BP 110/77 | HR 94 | Wt 291.2 lb

## 2016-07-18 DIAGNOSIS — Z3400 Encounter for supervision of normal first pregnancy, unspecified trimester: Secondary | ICD-10-CM

## 2016-07-18 DIAGNOSIS — J4520 Mild intermittent asthma, uncomplicated: Secondary | ICD-10-CM

## 2016-07-18 DIAGNOSIS — R102 Pelvic and perineal pain: Secondary | ICD-10-CM

## 2016-07-18 DIAGNOSIS — O23593 Infection of other part of genital tract in pregnancy, third trimester: Secondary | ICD-10-CM

## 2016-07-18 DIAGNOSIS — A5901 Trichomonal vulvovaginitis: Secondary | ICD-10-CM | POA: Insufficient documentation

## 2016-07-18 DIAGNOSIS — O23592 Infection of other part of genital tract in pregnancy, second trimester: Secondary | ICD-10-CM | POA: Insufficient documentation

## 2016-07-18 DIAGNOSIS — O26893 Other specified pregnancy related conditions, third trimester: Secondary | ICD-10-CM

## 2016-07-18 DIAGNOSIS — A6009 Herpesviral infection of other urogenital tract: Secondary | ICD-10-CM

## 2016-07-18 DIAGNOSIS — O26899 Other specified pregnancy related conditions, unspecified trimester: Secondary | ICD-10-CM

## 2016-07-18 DIAGNOSIS — Z3403 Encounter for supervision of normal first pregnancy, third trimester: Secondary | ICD-10-CM

## 2016-07-18 DIAGNOSIS — O98313 Other infections with a predominantly sexual mode of transmission complicating pregnancy, third trimester: Secondary | ICD-10-CM

## 2016-07-18 MED ORDER — COMFORT FIT MATERNITY SUPP LG MISC: 1.0000 [IU] | Freq: Every day | 0 refills | Status: DC

## 2016-07-18 NOTE — Patient Instructions (Addendum)
AREA PEDIATRIC/FAMILY PRACTICE PHYSICIANS  [facility] [address], [address] [place], [place]  [address] Phone - [phone]   Fax - [phone]  [facility] [address] [place], [place] [address] Phone - [phone]   Fax - [phone]  [facility] [address] [place], [place]  [address] Phone - [phone]   Fax - [phone]  [facility] [address] [place], [place]  [address] Phone - [phone]   Fax - [phone]  [facility] [address] [place], [place]  [address] Phone - [phone]   Fax - [phone]  [facility] [address] [place], [place]  [address] Phone - [phone]   Fax - [phone]  [facility] [address] [place], [place]  [address] Phone - [phone]   Fax - [phone]  [facility] [address], [address] [place], [place]  [address] Phone - [phone]   Fax - [phone]  [facility] [address] [place], [place]  [address] Phone - [phone]   Fax - [phone] [facility] [address] [place], [place]  [address] Phone - [phone]   Fax - [phone]  [facility] [address] [place], [place]  [address] Phone - [phone]   Fax - [phone]  [facility] [address] [place], [place]  [address] Phone - [phone]   Fax - [phone]  [facility] [address] [place], [place]  [address] Phone - [phone]   Fax - [phone]  [facility] [address] [place], [place]  [address] Phone - [phone]  [facility] [address] [place], [place]  [address] Phone - [phone]   Fax - [phone]  [facility] [address] [place], [place]  [address] Phone - [phone]   Fax - [phone]  [facility] [address] [place], [place]  [address] Phone - [phone]   Fax - [phone]  [facility] [address] [place], [place]  [address] Phone - [phone]   Fax - [phone]   [facility] [address] [place], [place]  [address] Phone - [phone]   Fax - [phone]  [facility] [address] [place], [place]  [address] Phone - [phone]   Fax - [phone]  [facility] [address] [place], [place]  [address] Phone - [phone]   Fax - [phone]  [facility] [address] [place], [place]  [address] Phone - [phone]   Fax - [phone]  [facility] [address] [place], [place]  [address] Phone - [phone]   Fax - [phone] [facility] [address] [place], [place]  [address] Phone - [phone]   Fax - [phone]  [facility] [address] [place], [place]  [address] Phone - [phone]   Fax - [phone]  [facility] [address] , [place]  [address] Phone - [phone]   Fax - [phone]  [facility] Charlene Flemming MD [address] [place] [address] Phone [phone]  Fax [phone]  Contraception Choices Contraception (birth control) is the use of any methods or devices to prevent   pregnancy. Below are some methods to help avoid pregnancy. Hormonal methods  Contraceptive implant. This is a thin, plastic tube containing progesterone hormone. It does not contain estrogen hormone. Your health care provider inserts the tube in the inner part of the upper arm. The tube can remain in place for up to 3 years. After 3 years, the implant must be removed. The implant prevents the  ovaries from releasing an egg (ovulation), thickens the cervical mucus to prevent sperm from entering the uterus, and thins the lining of the inside of the uterus.  Progesterone-only injections. These injections are given every 3 months by your health care provider to prevent pregnancy. This synthetic progesterone hormone stops the ovaries from releasing eggs. It also thickens cervical mucus and changes the uterine lining. This makes it harder for sperm to survive in the uterus.  Birth control pills. These pills contain estrogen and progesterone hormone. They work by preventing the ovaries from releasing eggs (ovulation). They also cause the cervical mucus to thicken, preventing the sperm from entering the uterus. Birth control pills are prescribed by a health care provider.Birth control pills can also be used to treat heavy periods.  Minipill. This type of birth control pill contains only the progesterone hormone. They are taken every day of each month and must be prescribed by your health care provider.  Birth control patch. The patch contains hormones similar to those in birth control pills. It must be changed once a week and is prescribed by a health care provider.  Vaginal ring. The ring contains hormones similar to those in birth control pills. It is left in the vagina for 3 weeks, removed for 1 week, and then a new one is put back in place. The patient must be comfortable inserting and removing the ring from the vagina.A health care provider's prescription is necessary.  Emergency contraception. Emergency contraceptives prevent pregnancy after unprotected sexual intercourse. This pill can be taken right after sex or up to 5 days after unprotected sex. It is most effective the sooner you take the pills after having sexual intercourse. Most emergency contraceptive pills are available without a prescription. Check with your pharmacist. Do not use emergency contraception as your only form of birth  control. Barrier methods  Female condom. This is a thin sheath (latex or rubber) that is worn over the penis during sexual intercourse. It can be used with spermicide to increase effectiveness.  Female condom. This is a soft, loose-fitting sheath that is put into the vagina before sexual intercourse.  Diaphragm. This is a soft, latex, dome-shaped barrier that must be fitted by a health care provider. It is inserted into the vagina, along with a spermicidal jelly. It is inserted before intercourse. The diaphragm should be left in the vagina for 6 to 8 hours after intercourse.  Cervical cap. This is a round, soft, latex or plastic cup that fits over the cervix and must be fitted by a health care provider. The cap can be left in place for up to 48 hours after intercourse.  Sponge. This is a soft, circular piece of polyurethane foam. The sponge has spermicide in it. It is inserted into the vagina after wetting it and before sexual intercourse.  Spermicides. These are chemicals that kill or block sperm from entering the cervix and uterus. They come in the form of creams, jellies, suppositories, foam, or tablets. They do not require a prescription. They are inserted into the vagina with an applicator before having sexual intercourse. The process   must be repeated every time you have sexual intercourse. Intrauterine contraception  Intrauterine device (IUD). This is a T-shaped device that is put in a woman's uterus during a menstrual period to prevent pregnancy. There are 2 types: ? Copper IUD. This type of IUD is wrapped in copper wire and is placed inside the uterus. Copper makes the uterus and fallopian tubes produce a fluid that kills sperm. It can stay in place for 10 years. ? Hormone IUD. This type of IUD contains the hormone progestin (synthetic progesterone). The hormone thickens the cervical mucus and prevents sperm from entering the uterus, and it also thins the uterine lining to prevent  implantation of a fertilized egg. The hormone can weaken or kill the sperm that get into the uterus. It can stay in place for 3-5 years, depending on which type of IUD is used. Permanent methods of contraception  Female tubal ligation. This is when the woman's fallopian tubes are surgically sealed, tied, or blocked to prevent the egg from traveling to the uterus.  Hysteroscopic sterilization. This involves placing a small coil or insert into each fallopian tube. Your doctor uses a technique called hysteroscopy to do the procedure. The device causes scar tissue to form. This results in permanent blockage of the fallopian tubes, so the sperm cannot fertilize the egg. It takes about 3 months after the procedure for the tubes to become blocked. You must use another form of birth control for these 3 months.  Female sterilization. This is when the female has the tubes that carry sperm tied off (vasectomy).This blocks sperm from entering the vagina during sexual intercourse. After the procedure, the man can still ejaculate fluid (semen). Natural planning methods  Natural family planning. This is not having sexual intercourse or using a barrier method (condom, diaphragm, cervical cap) on days the woman could become pregnant.  Calendar method. This is keeping track of the length of each menstrual cycle and identifying when you are fertile.  Ovulation method. This is avoiding sexual intercourse during ovulation.  Symptothermal method. This is avoiding sexual intercourse during ovulation, using a thermometer and ovulation symptoms.  Post-ovulation method. This is timing sexual intercourse after you have ovulated. Regardless of which type or method of contraception you choose, it is important that you use condoms to protect against the transmission of sexually transmitted infections (STIs). Talk with your health care provider about which form of contraception is most appropriate for you. This information is not  intended to replace advice given to you by your health care provider. Make sure you discuss any questions you have with your health care provider. Document Released: 02/21/2005 Document Revised: 07/30/2015 Document Reviewed: 08/16/2012 Elsevier Interactive Patient Education  2017 Elsevier Inc.  

## 2016-07-18 NOTE — Progress Notes (Signed)
   PRENATAL VISIT NOTE  Subjective:  Alyssa Goodman is a 26 y.o. G1P0 at 560w6d being seen today for ongoing prenatal care.  She is currently monitored for the following issues for this low-risk pregnancy and has Asthma, chronic; Morbid obesity; Encounter for supervision of normal pregnancy, antepartum; Genital herpes complicating pregnancy; Obesity complicating pregnancy; Trichomonal vaginitis during pregnancy in second trimester; Nausea and vomiting during pregnancy prior to [redacted] weeks gestation; Heartburn during pregnancy in second trimester; and Morbid obesity with BMI of 50.0-59.9, adult (HCC) on her problem list.  Patient reports backache, no bleeding, no contractions, no cramping and no leaking.  Contractions: Not present. Vag. Bleeding: None.  Movement: Present. Denies leaking of fluid.   The following portions of the patient's history were reviewed and updated as appropriate: allergies, current medications, past family history, past medical history, past social history, past surgical history and problem list. Problem list updated.  Objective:   Vitals:   07/18/16 0850  BP: 110/77  Pulse: 94  Weight: 291 lb 3.2 oz (132.1 kg)    Fetal Status: Fetal Heart Rate (bpm): 138 Fundal Height: 34 cm Movement: Present     General:  Alert, oriented and cooperative. Patient is in no acute distress.  Skin: Skin is warm and dry. No rash noted.   Cardiovascular: Normal heart rate noted  Respiratory: Normal respiratory effort, no problems with respiration noted  Abdomen: Soft, gravid, appropriate for gestational age. Pain/Pressure: Absent     Pelvic:  Cervical exam deferred        Extremities: Normal range of motion.     Mental Status: Normal mood and affect. Normal behavior. Normal judgment and thought content.   Assessment and Plan:  Pregnancy: G1P0 at 5360w6d  1. Supervision of normal first pregnancy, antepartum     Round ligament pain, BC reviewed, peds list given  2. Genital herpes  affecting pregnancy in third trimester      Valtrex @36  weeks  3. Morbid obesity      Has lost 14 lbs this pregnancy  4. Mild intermittent chronic asthma without complication      Stable  5. Pain of round ligament complicating pregnancy, antepartum      MAU note reviewed from 07/15/16.  States symptoms are improved.  Is currently working.  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  Dispense: 1 each; Refill: 0  6. Trichomonal vaginitis during pregnancy in second trimester      TOC today - Cervicovaginal ancillary only  Preterm labor symptoms and general obstetric precautions including but not limited to vaginal bleeding, contractions, leaking of fluid and fetal movement were reviewed in detail with the patient. Please refer to After Visit Summary for other counseling recommendations.  Return in about 2 weeks (around 08/01/2016) for ROB.   Roe Coombsenney, [place] Demaria A, CNM

## 2016-07-18 NOTE — Progress Notes (Signed)
Patient reports good fetal movement, denies pain/contractions. 

## 2016-07-19 LAB — CERVICOVAGINAL ANCILLARY ONLY
Chlamydia: NEGATIVE
NEISSERIA GONORRHEA: NEGATIVE
TRICH (WINDOWPATH): POSITIVE — AB

## 2016-07-20 ENCOUNTER — Other Ambulatory Visit: Payer: Self-pay | Admitting: Certified Nurse Midwife

## 2016-07-20 DIAGNOSIS — A599 Trichomoniasis, unspecified: Secondary | ICD-10-CM

## 2016-07-20 MED ORDER — TINIDAZOLE 500 MG PO TABS: 2.0000 g | ORAL_TABLET | Freq: Every day | ORAL | 0 refills | Status: DC

## 2016-07-21 ENCOUNTER — Telehealth: Payer: Self-pay | Admitting: *Deleted

## 2016-07-21 NOTE — Telephone Encounter (Signed)
Pt called to office stating she had some questions and would like call back.  Attempt to call pt. No answer, LM on VM to call back.

## 2016-07-21 NOTE — Telephone Encounter (Signed)
See lab note.  

## 2016-07-22 ENCOUNTER — Encounter: Payer: Self-pay | Admitting: Certified Nurse Midwife

## 2016-07-25 ENCOUNTER — Encounter: Payer: Self-pay | Admitting: *Deleted

## 2016-07-26 ENCOUNTER — Other Ambulatory Visit: Payer: Self-pay | Admitting: Certified Nurse Midwife

## 2016-07-26 DIAGNOSIS — A599 Trichomoniasis, unspecified: Secondary | ICD-10-CM

## 2016-07-26 MED ORDER — METRONIDAZOLE 500 MG PO TABS: 500.0000 mg | ORAL_TABLET | Freq: Two times a day (BID) | ORAL | 0 refills | Status: DC

## 2016-07-26 MED ORDER — METRONIDAZOLE 0.75 % VA GEL
1.0000 | Freq: Two times a day (BID) | VAGINAL | 0 refills | Status: DC
Start: 2016-07-26 — End: 2016-08-03

## 2016-08-03 ENCOUNTER — Encounter (HOSPITAL_COMMUNITY): Payer: Self-pay

## 2016-08-03 ENCOUNTER — Ambulatory Visit (INDEPENDENT_AMBULATORY_CARE_PROVIDER_SITE_OTHER): Payer: Medicaid Other | Admitting: Certified Nurse Midwife

## 2016-08-03 ENCOUNTER — Encounter: Payer: Self-pay | Admitting: Certified Nurse Midwife

## 2016-08-03 ENCOUNTER — Inpatient Hospital Stay (HOSPITAL_COMMUNITY)
Admission: AD | Admit: 2016-08-03 | Discharge: 2016-08-03 | Disposition: A | Discharge status: Home / Self Care | Payer: Medicaid Other | Source: Ambulatory Visit | Attending: Family Medicine | Admitting: Family Medicine

## 2016-08-03 ENCOUNTER — Encounter: Payer: Self-pay | Admitting: *Deleted

## 2016-08-03 VITALS — BP 104/69 | HR 91 | Wt 293.0 lb

## 2016-08-03 DIAGNOSIS — O99213 Obesity complicating pregnancy, third trimester: Secondary | ICD-10-CM

## 2016-08-03 DIAGNOSIS — Z3403 Encounter for supervision of normal first pregnancy, third trimester: Secondary | ICD-10-CM

## 2016-08-03 DIAGNOSIS — Z87891 Personal history of nicotine dependence: Secondary | ICD-10-CM | POA: Diagnosis not present

## 2016-08-03 DIAGNOSIS — A5901 Trichomonal vulvovaginitis: Secondary | ICD-10-CM

## 2016-08-03 DIAGNOSIS — Z79899 Other long term (current) drug therapy: Secondary | ICD-10-CM | POA: Diagnosis not present

## 2016-08-03 DIAGNOSIS — O99713 Diseases of the skin and subcutaneous tissue complicating pregnancy, third trimester: Secondary | ICD-10-CM | POA: Diagnosis not present

## 2016-08-03 DIAGNOSIS — O9989 Other specified diseases and conditions complicating pregnancy, childbirth and the puerperium: Secondary | ICD-10-CM

## 2016-08-03 DIAGNOSIS — O99513 Diseases of the respiratory system complicating pregnancy, third trimester: Secondary | ICD-10-CM | POA: Insufficient documentation

## 2016-08-03 DIAGNOSIS — L509 Urticaria, unspecified: Secondary | ICD-10-CM | POA: Diagnosis present

## 2016-08-03 DIAGNOSIS — O23593 Infection of other part of genital tract in pregnancy, third trimester: Secondary | ICD-10-CM

## 2016-08-03 DIAGNOSIS — T373X5A Adverse effect of other antiprotozoal drugs, initial encounter: Secondary | ICD-10-CM | POA: Diagnosis not present

## 2016-08-03 DIAGNOSIS — E669 Obesity, unspecified: Secondary | ICD-10-CM | POA: Insufficient documentation

## 2016-08-03 DIAGNOSIS — J45909 Unspecified asthma, uncomplicated: Secondary | ICD-10-CM | POA: Insufficient documentation

## 2016-08-03 DIAGNOSIS — O98313 Other infections with a predominantly sexual mode of transmission complicating pregnancy, third trimester: Secondary | ICD-10-CM

## 2016-08-03 DIAGNOSIS — O23592 Infection of other part of genital tract in pregnancy, second trimester: Secondary | ICD-10-CM

## 2016-08-03 DIAGNOSIS — O9A213 Injury, poisoning and certain other consequences of external causes complicating pregnancy, third trimester: Secondary | ICD-10-CM | POA: Diagnosis not present

## 2016-08-03 DIAGNOSIS — T7840XA Allergy, unspecified, initial encounter: Secondary | ICD-10-CM

## 2016-08-03 DIAGNOSIS — Z3400 Encounter for supervision of normal first pregnancy, unspecified trimester: Secondary | ICD-10-CM

## 2016-08-03 DIAGNOSIS — Z3A32 32 weeks gestation of pregnancy: Secondary | ICD-10-CM | POA: Insufficient documentation

## 2016-08-03 DIAGNOSIS — Z6841 Body Mass Index (BMI) 40.0 and over, adult: Secondary | ICD-10-CM

## 2016-08-03 LAB — URINALYSIS, ROUTINE W REFLEX MICROSCOPIC
BACTERIA UA: NONE SEEN
Bilirubin Urine: NEGATIVE
Glucose, UA: NEGATIVE mg/dL
HGB URINE DIPSTICK: NEGATIVE
Ketones, ur: NEGATIVE mg/dL
NITRITE: NEGATIVE
PROTEIN: 30 mg/dL — AB
SPECIFIC GRAVITY, URINE: 1.027 (ref 1.005–1.030)
pH: 5 (ref 5.0–8.0)

## 2016-08-03 MED ORDER — PREDNISONE 10 MG PO TABS: 10.0000 mg | ORAL_TABLET | Freq: Every day | ORAL | 0 refills | Status: DC

## 2016-08-03 MED ORDER — LORATADINE 10 MG PO TABS
10.0000 mg | ORAL_TABLET | Freq: Once | ORAL | Status: AC
  Administered 2016-08-03: 10 mg via ORAL
  Filled 2016-08-03: qty 1

## 2016-08-03 MED ORDER — HYDROXYZINE HCL 10 MG PO TABS
10.0000 mg | ORAL_TABLET | Freq: Once | ORAL | Status: AC
  Administered 2016-08-03: 10 mg via ORAL
  Filled 2016-08-03: qty 1

## 2016-08-03 MED ORDER — LORATADINE 10 MG PO TABS
10.0000 mg | ORAL_TABLET | Freq: Every day | ORAL | Status: DC
  Filled 2016-08-03: qty 1

## 2016-08-03 NOTE — Progress Notes (Signed)
[facility] for reaction to Flagyl that was given. Pt states that she has broke out in rash, irritation.  Pt was given Prednisone Rx and has to pick up at pharmacy.

## 2016-08-03 NOTE — MAU Provider Note (Signed)
History   Patient Alyssa Goodman is a 26 y.o.  G1P0 At 3160w5d  Here with complaints of itching and hives since last night. She is allergic to Flagyl but wanted to take Flagyl for her Trichomonas infection so she was taking it with benadryl BID for 5 days.  She started having hives and itching on Monday night, and then took her Flagyl yesterday morning. Yesterday's dose was her second to last days.   Denies bleeding, leaking of fluid or decreased fetal movements.   She has had an allergic reaction to Flagyl in the past for trich. She had a reocurrence of Trich and was prescribed with tinidazole but she also wanted to take Flagyl to make sure she did not have a reoccurence.  She was planning to start her tinidazole today once she finished her flagyl.   CSN: [id number]  Arrival date and time: 08/03/16 0035   First Provider Initiated Contact with Patient 08/03/16 0211      Chief Complaint  Patient presents with  . Allergic Reaction   Allergic Reaction  This is a new problem. The current episode started yesterday. The problem occurs constantly. The problem has been gradually worsening since onset. The patient was exposed to a prescription drug. The time of exposure was just prior to onset. The exposure occurred at home. Pertinent negatives include no chest pressure, coughing, hyperventilation, trouble swallowing or vomiting. There is no swelling present.    OB History    Gravida Para Term Preterm AB Living   1             SAB TAB Ectopic Multiple Live Births                  Past Medical History:  Diagnosis Date  . Asthma   . Gallstones   . Obesity   . Trichimoniasis     Past Surgical History:  Procedure Laterality Date  . NO PAST SURGERIES      Family History  Problem Relation Age of Onset  . Hypertension Mother   . Diabetes Maternal Uncle   . Hypertension Maternal Uncle   . Diabetes Maternal Grandmother   . Hypertension Maternal Grandmother   . Heart disease Maternal  Grandmother   . Cancer Maternal Grandfather        colon    Social History  Substance Use Topics  . Smoking status: Former Smoker    Types: Cigarettes  . Smokeless tobacco: Never Used     Comment: stopped with +UPT  . Alcohol use No    Allergies:  Allergies  Allergen Reactions  . Metronidazole Shortness Of Breath and Rash  . Kiwi Extract Itching  . Shellfish Allergy     Mostly oysters and limited to number of shrimp.    Prescriptions Prior to Admission  Medication Sig Dispense Refill Last Dose  . metroNIDAZOLE (FLAGYL) 500 MG tablet Take 1 tablet (500 mg total) by mouth 2 (two) times daily. 14 tablet 0 08/02/2016 at 2230  . Prenatal Vit-Fe Fumarate-FA (PREPLUS) 27-1 MG TABS Take 1 tablet by mouth daily. 30 tablet 12 Past Week at Unknown time  . acetaminophen (TYLENOL) 325 MG tablet Take 650 mg by mouth every 6 (six) hours as needed for mild pain or headache.   07/14/2016 at Unknown time  . cyclobenzaprine (FLEXERIL) 10 MG tablet Take 1 tablet (10 mg total) by mouth 2 (two) times daily as needed for muscle spasms. (Patient not taking: Reported on 07/18/2016) 20 tablet 0 Not Taking  .  Elastic Bandages & Supports (COMFORT FIT MATERNITY SUPP LG) MISC 1 Units by Does not apply route daily. 1 each 0   . metroNIDAZOLE (METROGEL VAGINAL) 0.75 % vaginal gel Place 1 Applicatorful vaginally 2 (two) times daily. 70 g 0 07/29/2016  . tinidazole (TINDAMAX) 500 MG tablet Take 4 tablets (2,000 mg total) by mouth daily with breakfast. 12 tablet 0     Review of Systems  HENT: Negative for facial swelling and trouble swallowing.   Respiratory: Negative.  Negative for cough.   Gastrointestinal: Negative.  Negative for vomiting.  Genitourinary: Negative.   Skin:       Hives over chest, arms, abdomen and upper thighs  Neurological: Negative.    Physical Exam   Blood pressure 124/68, pulse 88, temperature 97.9 F (36.6 C), temperature source Oral, resp. rate 18, height 5\' 1"  (1.549 m), weight 294  lb (133.4 kg), last menstrual period 12/22/2015, SpO2 100 %.  Physical Exam  Constitutional: She is oriented to person, place, and time. She appears well-developed and well-nourished.  HENT:  Head: Normocephalic.  Neck: Normal range of motion.  Respiratory: Effort normal.  GI: Soft.  Neurological: She is alert and oriented to person, place, and time.  Skin: Skin is warm and dry.  Diffuse red welts over abdomen, chest and upper thighs.     MAU Course  Procedures  MDM -Vistaril and claritin for itching -NST: 130, moderate variability, present acels, neg decels, no ctx -No cultures or ob exam done due to lack of obstetrical complaints.   Assessment and Plan   1. Allergic reaction to drug, initial encounter    2. Patient stable for discharge with RX and prednisone taper. Start tinidazole.  3. Patient verbalized understanding; will keep appt today at [facility].   Charlesetta Garibaldi Maxwell Lemen 08/03/2016, 2:12 AM

## 2016-08-03 NOTE — Progress Notes (Signed)
   PRENATAL VISIT NOTE  Subjective:  Alyssa Goodman is a 26 y.o. G1P0 at 8w5dbeing seen today for ongoing prenatal care.  She is currently monitored for the following issues for this low-risk pregnancy and has Asthma, chronic; Morbid obesity; Encounter for supervision of normal pregnancy, antepartum; Genital herpes complicating pregnancy; Obesity complicating pregnancy; Trichomonal vaginitis during pregnancy in second trimester; Nausea and vomiting during pregnancy prior to [redacted] weeks gestation; Heartburn during pregnancy in second trimester; and Morbid obesity with BMI of 50.0-59.9, adult ([place] on her problem list.  Patient reports no bleeding, no contractions, no cramping, no leaking and reaction to Flagyl diagnosed this morning and seen in MAU.  Has not started on pred pack yet.  Denies any SOB.  .  Contractions: Not present. Vag. Bleeding: None.  Movement: Present. Denies leaking of fluid.   The following portions of the patient's history were reviewed and updated as appropriate: allergies, current medications, past family history, past medical history, past social history, past surgical history and problem list. Problem list updated.  Objective:   Vitals:   08/03/16 0909  BP: 104/69  Pulse: 91  Weight: 293 lb (132.9 kg)    Fetal Status: Fetal Heart Rate (bpm): 131 Fundal Height: 37 cm Movement: Present     General:  Alert, oriented and cooperative. Patient is in no acute distress.  Skin: Skin is warm and dry. No rash noted.   Cardiovascular: Normal heart rate noted  Respiratory: Normal respiratory effort, no problems with respiration noted  Abdomen: Soft, gravid, appropriate for gestational age. Pain/Pressure: Absent     Pelvic:  Cervical exam deferred        Extremities: Normal range of motion.     Mental Status: Normal mood and affect. Normal behavior. Normal judgment and thought content.   Assessment and Plan:  Pregnancy: G1P0 at 370w5d. Trichomonal vaginitis during pregnancy  in second trimester     S/P Flagyl, completed 6 days.    2. Supervision of normal first pregnancy, antepartum      Feeling overwhelmed.  SW met with patient in office.   3. Morbid obesity with BMI of 50.0-59.9, adult (H[place]astian    Has lost 12 lbs this pregnancy.     Preterm labor symptoms and general obstetric precautions including but not limited to vaginal bleeding, contractions, leaking of fluid and fetal movement were reviewed in detail with the patient. Please refer to After Visit Summary for other counseling recommendations.  Return in about 2 weeks (around 08/17/2016) for ROFreedom  RaMorene CrockerCNM

## 2016-08-03 NOTE — Discharge Instructions (Signed)
Prednisone tablets °What is this medicine? °PREDNISONE (PRED ni sone) is a corticosteroid. It is commonly used to treat inflammation of the skin, joints, lungs, and other organs. Common conditions treated include asthma, allergies, and arthritis. It is also used for other conditions, such as blood disorders and diseases of the adrenal glands. °This medicine may be used for other purposes; ask your health care provider or pharmacist if you have questions. °COMMON BRAND NAME(S): Deltasone, Predone, Sterapred, Sterapred DS °What should I tell my health care provider before I take this medicine? °They need to know if you have any of these conditions: °-Cushing's syndrome °-diabetes °-glaucoma °-heart disease °-high blood pressure °-infection (especially a virus infection such as chickenpox, cold sores, or herpes) °-kidney disease °-liver disease °-mental illness °-myasthenia gravis °-osteoporosis °-seizures °-stomach or intestine problems °-thyroid disease °-an unusual or allergic reaction to lactose, prednisone, other medicines, foods, dyes, or preservatives °-pregnant or trying to get pregnant °-breast-feeding °How should I use this medicine? °Take this medicine by mouth with a glass of water. Follow the directions on the prescription label. Take this medicine with food. If you are taking this medicine once a day, take it in the morning. Do not take more medicine than you are told to take. Do not suddenly stop taking your medicine because you may develop a severe reaction. Your doctor will tell you how much medicine to take. If your doctor wants you to stop the medicine, the dose may be slowly lowered over time to avoid any side effects. °Talk to your pediatrician regarding the use of this medicine in children. Special care may be needed. °Overdosage: If you think you have taken too much of this medicine contact a poison control center or emergency room at once. °NOTE: This medicine is only for you. Do not share this  medicine with others. °What if I miss a dose? °If you miss a dose, take it as soon as you can. If it is almost time for your next dose, talk to your doctor or health care professional. You may need to miss a dose or take an extra dose. Do not take double or extra doses without advice. °What may interact with this medicine? °Do not take this medicine with any of the following medications: °-metyrapone °-mifepristone °This medicine may also interact with the following medications: °-aminoglutethimide °-amphotericin B °-aspirin and aspirin-like medicines °-barbiturates °-certain medicines for diabetes, like glipizide or glyburide °-cholestyramine °-cholinesterase inhibitors °-cyclosporine °-digoxin °-diuretics °-ephedrine °-female hormones, like estrogens and birth control pills °-isoniazid °-ketoconazole °-NSAIDS, medicines for pain and inflammation, like ibuprofen or naproxen °-phenytoin °-rifampin °-toxoids °-vaccines °-warfarin °This list may not describe all possible interactions. Give your health care provider a list of all the medicines, herbs, non-prescription drugs, or dietary supplements you use. Also tell them if you smoke, drink alcohol, or use illegal drugs. Some items may interact with your medicine. °What should I watch for while using this medicine? °Visit your doctor or health care professional for regular checks on your progress. If you are taking this medicine over a prolonged period, carry an identification card with your name and address, the type and dose of your medicine, and your doctor's name and address. °This medicine may increase your risk of getting an infection. Tell your doctor or health care professional if you are around anyone with measles or chickenpox, or if you develop sores or blisters that do not heal properly. °If you are going to have surgery, tell your doctor or health care professional that   you have taken this medicine within the last twelve months. °Ask your doctor or health  care professional about your diet. You may need to lower the amount of salt you eat. °This medicine may affect blood sugar levels. If you have diabetes, check with your doctor or health care professional before you change your diet or the dose of your diabetic medicine. °What side effects may I notice from receiving this medicine? °Side effects that you should report to your doctor or health care professional as soon as possible: °-allergic reactions like skin rash, itching or hives, swelling of the face, lips, or tongue °-changes in emotions or moods °-changes in vision °-depressed mood °-eye pain °-fever or chills, cough, sore throat, pain or difficulty passing urine °-increased thirst °-swelling of ankles, feet °Side effects that usually do not require medical attention (report to your doctor or health care professional if they continue or are bothersome): °-confusion, excitement, restlessness °-headache °-nausea, vomiting °-skin problems, acne, thin and shiny skin °-trouble sleeping °-weight gain °This list may not describe all possible side effects. Call your doctor for medical advice about side effects. You may report side effects to FDA at 1-800-FDA-1088. °Where should I keep my medicine? °Keep out of the reach of children. °Store at room temperature between 15 and 30 degrees C (59 and 86 degrees F). Protect from light. Keep container tightly closed. Throw away any unused medicine after the expiration date. °NOTE: This sheet is a summary. It may not cover all possible information. If you have questions about this medicine, talk to your doctor, pharmacist, or health care provider. °© 2018 Elsevier/Gold Standard (2010-10-07 10:57:14) ° °

## 2016-08-03 NOTE — MAU Note (Signed)
Pt here with rash all over upper body after taking Flagyl.

## 2016-08-15 ENCOUNTER — Other Ambulatory Visit: Payer: Self-pay | Admitting: Certified Nurse Midwife

## 2016-08-15 ENCOUNTER — Ambulatory Visit (HOSPITAL_COMMUNITY)
Admission: RE | Admit: 2016-08-15 | Discharge: 2016-08-15 | Disposition: A | Discharge status: Home / Self Care | Payer: Medicaid Other | Source: Ambulatory Visit | Attending: Certified Nurse Midwife | Admitting: Certified Nurse Midwife

## 2016-08-15 DIAGNOSIS — O99210 Obesity complicating pregnancy, unspecified trimester: Secondary | ICD-10-CM

## 2016-08-15 DIAGNOSIS — Z3A33 33 weeks gestation of pregnancy: Secondary | ICD-10-CM

## 2016-08-15 DIAGNOSIS — O99213 Obesity complicating pregnancy, third trimester: Secondary | ICD-10-CM | POA: Insufficient documentation

## 2016-08-15 DIAGNOSIS — Z362 Encounter for other antenatal screening follow-up: Secondary | ICD-10-CM

## 2016-08-15 DIAGNOSIS — Z3400 Encounter for supervision of normal first pregnancy, unspecified trimester: Secondary | ICD-10-CM

## 2016-08-17 ENCOUNTER — Ambulatory Visit (HOSPITAL_COMMUNITY): Payer: Medicaid Other

## 2016-08-17 ENCOUNTER — Other Ambulatory Visit: Payer: Self-pay | Admitting: Certified Nurse Midwife

## 2016-08-18 ENCOUNTER — Encounter: Payer: Self-pay | Admitting: Certified Nurse Midwife

## 2016-08-18 ENCOUNTER — Ambulatory Visit (INDEPENDENT_AMBULATORY_CARE_PROVIDER_SITE_OTHER): Payer: Medicaid Other | Admitting: Certified Nurse Midwife

## 2016-08-18 VITALS — BP 125/78 | HR 90 | Wt 296.0 lb

## 2016-08-18 DIAGNOSIS — A6009 Herpesviral infection of other urogenital tract: Secondary | ICD-10-CM

## 2016-08-18 DIAGNOSIS — Z6841 Body Mass Index (BMI) 40.0 and over, adult: Secondary | ICD-10-CM

## 2016-08-18 DIAGNOSIS — Z3493 Encounter for supervision of normal pregnancy, unspecified, third trimester: Secondary | ICD-10-CM

## 2016-08-18 DIAGNOSIS — Z3400 Encounter for supervision of normal first pregnancy, unspecified trimester: Secondary | ICD-10-CM

## 2016-08-18 DIAGNOSIS — Z3483 Encounter for supervision of other normal pregnancy, third trimester: Secondary | ICD-10-CM

## 2016-08-18 DIAGNOSIS — O98313 Other infections with a predominantly sexual mode of transmission complicating pregnancy, third trimester: Secondary | ICD-10-CM

## 2016-08-18 MED ORDER — VALACYCLOVIR HCL 1 G PO TABS: 1000.0000 mg | ORAL_TABLET | Freq: Two times a day (BID) | ORAL | 2 refills | Status: DC

## 2016-08-18 NOTE — Patient Instructions (Addendum)
AREA PEDIATRIC/FAMILY PRACTICE PHYSICIANS  [facility] [address], [address] [place], [place]  [address] Phone - [phone]   Fax - [phone]  [facility] [address] [place], [place] [address] Phone - [phone]   Fax - [phone]  [facility] [address] [place], [place]  [address] Phone - [phone]   Fax - [phone]  [facility] [address] [place], [place]  [address] Phone - [phone]   Fax - [phone]  [facility] [address] [place], [place]  [address] Phone - [phone]   Fax - [phone]  [facility] [address] [place], [place]  [address] Phone - [phone]   Fax - [phone]  [facility] [address] [place], [place]  [address] Phone - [phone]   Fax - [phone]  [facility] [address] [place], [place]  [address] Phone - [phone]   Fax - [phone]  [facility] [address] [place], [place]  [address] Phone - [phone]   Fax - [phone] [facility] [address] [place], [place]  [address] Phone - [phone]   Fax - [phone]  [facility] [address] [place], [place]  [address] Phone - [phone]   Fax - [phone]  [facility] [address] [place], [place]  [address] Phone - [phone]   Fax - [phone]  [facility] [address] [place], [place]  [address] Phone - [phone]   Fax - [phone]  [facility] GOSR[facility] [address] [place], [place]  [address] Phone - [phone]  [facility] [address] [place], [place]  [address] Phone - [phone]   Fax - [phone]  [facility] [address] [place], [place]  [address] Phone - [phone]   Fax - [phone]  [facility] [address] [place], [place]  [address] Phone - [phone]   Fax - [phone]  [facility] [address] [place], [place]  [address] Phone - [phone]   Fax - [phone]   [facility] [address] [place], [place]  [address] Phone - [phone]   Fax - [phone]  [facility] [address] [place], [place]  [address] Phone - [phone]   Fax - [phone]  DAV[facility] RUBIN [address] [place], [place]  [address] Phone - [phone]   Fax - [phone]  [facility] [address] [place], [place]  [address] Phone - [phone]   Fax - [phone]  [facility] [address] , [place]  [address] Phone - [phone]   Fax - [phone] [facility] [address] [place], [place]  [address] Phone - [phone]   Fax - [phone]  [facility] [address] [place], [place]  [address] Phone - [phone]   Fax - [phone]  [facility] [address] [place],   [address] Phone - [phone]   Fax - [phone]  [facility] Charlene Flemming MD [address] [place]  [address] Phone [phone]  Fax [phone]  Contraception Choices Contraception (birth control) is the use of any methods or devices to prevent   pregnancy. Below are some methods to help avoid pregnancy. Hormonal methods  Contraceptive implant. This is a thin, plastic tube containing progesterone hormone. It does not contain estrogen hormone. Your health care provider inserts the tube in the inner part of the upper arm. The tube can remain in place for up to 3 years. After 3 years, the implant must be removed. The implant prevents the  ovaries from releasing an egg (ovulation), thickens the cervical mucus to prevent sperm from entering the uterus, and thins the lining of the inside of the uterus.  Progesterone-only injections. These injections are given every 3 months by your health care provider to prevent pregnancy. This synthetic progesterone hormone stops the ovaries from releasing eggs. It also thickens cervical mucus and changes the uterine lining. This makes it harder for sperm to survive in the uterus.  Birth control pills. These pills contain estrogen and progesterone hormone. They work by preventing the ovaries from releasing eggs (ovulation). They also cause the cervical mucus to thicken, preventing the sperm from entering the uterus. Birth control pills are prescribed by a health care provider.Birth control pills can also be used to treat heavy periods.  Minipill. This type of birth control pill contains only the progesterone hormone. They are taken every day of each month and must be prescribed by your health care provider.  Birth control patch. The patch contains hormones similar to those in birth control pills. It must be changed once a week and is prescribed by a health care provider.  Vaginal ring. The ring contains hormones similar to those in birth control pills. It is left in the vagina for 3 weeks, removed for 1 week, and then a new one is put back in place. The patient must be comfortable inserting and removing the ring from the vagina.A health care provider's prescription is necessary.  Emergency contraception. Emergency contraceptives prevent pregnancy after unprotected sexual intercourse. This pill can be taken right after sex or up to 5 days after unprotected sex. It is most effective the sooner you take the pills after having sexual intercourse. Most emergency contraceptive pills are available without a prescription. Check with your pharmacist. Do not use emergency contraception as your only form of birth  control. Barrier methods  Female condom. This is a thin sheath (latex or rubber) that is worn over the penis during sexual intercourse. It can be used with spermicide to increase effectiveness.  Female condom. This is a soft, loose-fitting sheath that is put into the vagina before sexual intercourse.  Diaphragm. This is a soft, latex, dome-shaped barrier that must be fitted by a health care provider. It is inserted into the vagina, along with a spermicidal jelly. It is inserted before intercourse. The diaphragm should be left in the vagina for 6 to 8 hours after intercourse.  Cervical cap. This is a round, soft, latex or plastic cup that fits over the cervix and must be fitted by a health care provider. The cap can be left in place for up to 48 hours after intercourse.  Sponge. This is a soft, circular piece of polyurethane foam. The sponge has spermicide in it. It is inserted into the vagina after wetting it and before sexual intercourse.  Spermicides. These are chemicals that kill or block sperm from entering the cervix and uterus. They come in the form of creams, jellies, suppositories, foam, or tablets. They do not require a prescription. They are inserted into the vagina with an applicator before having sexual intercourse. The process   must be repeated every time you have sexual intercourse. Intrauterine contraception  Intrauterine device (IUD). This is a T-shaped device that is put in a woman's uterus during a menstrual period to prevent pregnancy. There are 2 types: ? Copper IUD. This type of IUD is wrapped in copper wire and is placed inside the uterus. Copper makes the uterus and fallopian tubes produce a fluid that kills sperm. It can stay in place for 10 years. ? Hormone IUD. This type of IUD contains the hormone progestin (synthetic progesterone). The hormone thickens the cervical mucus and prevents sperm from entering the uterus, and it also thins the uterine lining to prevent  implantation of a fertilized egg. The hormone can weaken or kill the sperm that get into the uterus. It can stay in place for 3-5 years, depending on which type of IUD is used. Permanent methods of contraception  Female tubal ligation. This is when the woman's fallopian tubes are surgically sealed, tied, or blocked to prevent the egg from traveling to the uterus.  Hysteroscopic sterilization. This involves placing a small coil or insert into each fallopian tube. Your doctor uses a technique called hysteroscopy to do the procedure. The device causes scar tissue to form. This results in permanent blockage of the fallopian tubes, so the sperm cannot fertilize the egg. It takes about 3 months after the procedure for the tubes to become blocked. You must use another form of birth control for these 3 months.  Female sterilization. This is when the female has the tubes that carry sperm tied off (vasectomy).This blocks sperm from entering the vagina during sexual intercourse. After the procedure, the man can still ejaculate fluid (semen). Natural planning methods  Natural family planning. This is not having sexual intercourse or using a barrier method (condom, diaphragm, cervical cap) on days the woman could become pregnant.  Calendar method. This is keeping track of the length of each menstrual cycle and identifying when you are fertile.  Ovulation method. This is avoiding sexual intercourse during ovulation.  Symptothermal method. This is avoiding sexual intercourse during ovulation, using a thermometer and ovulation symptoms.  Post-ovulation method. This is timing sexual intercourse after you have ovulated. Regardless of which type or method of contraception you choose, it is important that you use condoms to protect against the transmission of sexually transmitted infections (STIs). Talk with your health care provider about which form of contraception is most appropriate for you. This information is not  intended to replace advice given to you by your health care provider. Make sure you discuss any questions you have with your health care provider. Document Released: 02/21/2005 Document Revised: 07/30/2015 Document Reviewed: 08/16/2012 Elsevier Interactive Patient Education  2017 [employer].  

## 2016-08-18 NOTE — Progress Notes (Signed)
   PRENATAL VISIT NOTE  Subjective:  Alyssa Goodman is a 26 y.o. G1P0 at 7838w6d being seen today for ongoing prenatal care.  She is currently monitored for the following issues for this low-risk pregnancy and has Asthma, chronic; Morbid obesity; Encounter for supervision of normal pregnancy, antepartum; Genital herpes complicating pregnancy; Obesity complicating pregnancy; Trichomonal vaginitis during pregnancy in second trimester; Nausea and vomiting during pregnancy prior to [redacted] weeks gestation; Heartburn during pregnancy in second trimester; and Morbid obesity with BMI of 50.0-59.9, adult (HCC) on her problem list.  Patient reports no complaints.  Contractions: Not present. Vag. Bleeding: None.  Movement: Present. Denies leaking of fluid.   The following portions of the patient's history were reviewed and updated as appropriate: allergies, current medications, past family history, past medical history, past social history, past surgical history and problem list. Problem list updated.  Objective:   Vitals:   08/18/16 0849  BP: 125/78  Pulse: 90  Weight: 296 lb (134.3 kg)    Fetal Status: Fetal Heart Rate (bpm): 141 Fundal Height: 46 cm Movement: Present     General:  Alert, oriented and cooperative. Patient is in no acute distress.  Skin: Skin is warm and dry. No rash noted.   Cardiovascular: Normal heart rate noted  Respiratory: Normal respiratory effort, no problems with respiration noted  Abdomen: Soft, gravid, appropriate for gestational age. Pain/Pressure: Absent     Pelvic:  Cervical exam deferred        Extremities: Normal range of motion.  Edema: None  Mental Status: Normal mood and affect. Normal behavior. Normal judgment and thought content.   Assessment and Plan:  Pregnancy: G1P0 at 7738w6d  1. Genital herpes affecting pregnancy in third trimester      - valACYclovir (VALTREX) 1000 MG tablet; Take 1 tablet (1,000 mg total) by mouth 2 (two) times daily.  Dispense: 60  tablet; Refill: 2  2. Morbid obesity     9 lb weight loss this pregnancy  3. Supervision of normal first pregnancy, antepartum     FMLA paperwork turned in.  Plan for TOC and GBS next week.    4. Morbid obesity with BMI of 50.0-59.9, adult (HCC)       Preterm labor symptoms and general obstetric precautions including but not limited to vaginal bleeding, contractions, leaking of fluid and fetal movement were reviewed in detail with the patient. Please refer to After Visit Summary for other counseling recommendations.  Return in about 1 week (around 08/25/2016) for ROB, GBS.   Roe Coombsachelle A [name], CNM

## 2016-08-19 ENCOUNTER — Ambulatory Visit (HOSPITAL_COMMUNITY): Payer: Medicaid Other

## 2016-08-26 ENCOUNTER — Other Ambulatory Visit (HOSPITAL_COMMUNITY)
Admission: RE | Admit: 2016-08-26 | Discharge: 2016-08-26 | Disposition: A | Discharge status: Home / Self Care | Payer: Medicaid Other | Source: Ambulatory Visit | Attending: Certified Nurse Midwife | Admitting: Certified Nurse Midwife

## 2016-08-26 ENCOUNTER — Ambulatory Visit (INDEPENDENT_AMBULATORY_CARE_PROVIDER_SITE_OTHER): Payer: Medicaid Other | Admitting: Certified Nurse Midwife

## 2016-08-26 VITALS — BP 122/82 | HR 99 | Wt 296.0 lb

## 2016-08-26 DIAGNOSIS — A6009 Herpesviral infection of other urogenital tract: Secondary | ICD-10-CM

## 2016-08-26 DIAGNOSIS — O99213 Obesity complicating pregnancy, third trimester: Secondary | ICD-10-CM

## 2016-08-26 DIAGNOSIS — Z3400 Encounter for supervision of normal first pregnancy, unspecified trimester: Secondary | ICD-10-CM

## 2016-08-26 DIAGNOSIS — O98313 Other infections with a predominantly sexual mode of transmission complicating pregnancy, third trimester: Secondary | ICD-10-CM

## 2016-08-26 DIAGNOSIS — Z3403 Encounter for supervision of normal first pregnancy, third trimester: Secondary | ICD-10-CM | POA: Insufficient documentation

## 2016-08-26 DIAGNOSIS — Z6841 Body Mass Index (BMI) 40.0 and over, adult: Secondary | ICD-10-CM

## 2016-08-26 LAB — OB RESULTS CONSOLE GC/CHLAMYDIA: GC PROBE AMP, GENITAL: NEGATIVE

## 2016-08-26 NOTE — Progress Notes (Signed)
Patient has been having back pain and cramping on and off since Tuesday.

## 2016-08-28 LAB — STREP GP B NAA: Strep Gp B NAA: POSITIVE — AB

## 2016-08-29 NOTE — Progress Notes (Signed)
   PRENATAL VISIT NOTE  Subjective:  Alyssa Goodman is a 26 y.o. G1P0 at 7044w3d being seen today for ongoing prenatal care.  She is currently monitored for the following issues for this low-risk pregnancy and has Asthma, chronic; Morbid obesity; Encounter for supervision of normal pregnancy, antepartum; Genital herpes complicating pregnancy; Obesity complicating pregnancy; Trichomonal vaginitis during pregnancy in second trimester; Heartburn during pregnancy in second trimester; and Morbid obesity with BMI of 50.0-59.9, adult (HCC) on her problem list.  Patient reports no bleeding, no contractions, no cramping and no leaking.  Contractions: Irregular. Vag. Bleeding: None.  Movement: Present. Denies leaking of fluid.   The following portions of the patient's history were reviewed and updated as appropriate: allergies, current medications, past family history, past medical history, past social history, past surgical history and problem list. Problem list updated.  Objective:   Vitals:   08/26/16 0959  BP: 122/82  Pulse: 99  Weight: 296 lb (134.3 kg)    Fetal Status: Fetal Heart Rate (bpm): 142 Fundal Height: 50 cm Movement: Present  Presentation: Vertex  General:  Alert, oriented and cooperative. Patient is in no acute distress.  Skin: Skin is warm and dry. No rash noted.   Cardiovascular: Normal heart rate noted  Respiratory: Normal respiratory effort, no problems with respiration noted  Abdomen: Soft, gravid, appropriate for gestational age. Pain/Pressure: Present     Pelvic:  Cervical exam performed Dilation: Closed Effacement (%): Thick Station: Ballotable  Extremities: Normal range of motion.  Edema: Trace  Mental Status: Normal mood and affect. Normal behavior. Normal judgment and thought content.   Assessment and Plan:  Pregnancy: G1P0 at 1644w3d  1. Genital herpes affecting pregnancy in third trimester     Start on valtrex  2. Obesity affecting pregnancy in third trimester  Last EFW 80%@34  weeks  3. Morbid obesity with BMI of 50.0-59.9, adult (HCC)     Has lost 9 lbs this preganancy  4. Supervision of normal first pregnancy, antepartum     - Strep Gp B NAA - Cervicovaginal ancillary only  Preterm labor symptoms and general obstetric precautions including but not limited to vaginal bleeding, contractions, leaking of fluid and fetal movement were reviewed in detail with the patient. Please refer to After Visit Summary for other counseling recommendations.  Return in about 1 week (around 09/02/2016) for ROB.   Roe Coombsachelle A Talecia Sherlin, CNM

## 2016-08-30 ENCOUNTER — Other Ambulatory Visit: Payer: Self-pay | Admitting: Certified Nurse Midwife

## 2016-08-30 DIAGNOSIS — Z3400 Encounter for supervision of normal first pregnancy, unspecified trimester: Secondary | ICD-10-CM

## 2016-08-30 DIAGNOSIS — B951 Streptococcus, group B, as the cause of diseases classified elsewhere: Secondary | ICD-10-CM

## 2016-08-30 LAB — CERVICOVAGINAL ANCILLARY ONLY
BACTERIAL VAGINITIS: NEGATIVE
Candida vaginitis: NEGATIVE
Chlamydia: NEGATIVE
Neisseria Gonorrhea: NEGATIVE
TRICH (WINDOWPATH): NEGATIVE

## 2016-08-31 ENCOUNTER — Ambulatory Visit (INDEPENDENT_AMBULATORY_CARE_PROVIDER_SITE_OTHER): Payer: Medicaid Other | Admitting: Certified Nurse Midwife

## 2016-08-31 VITALS — BP 138/82 | HR 96 | Wt 297.2 lb

## 2016-08-31 DIAGNOSIS — B951 Streptococcus, group B, as the cause of diseases classified elsewhere: Secondary | ICD-10-CM

## 2016-08-31 DIAGNOSIS — O99213 Obesity complicating pregnancy, third trimester: Secondary | ICD-10-CM

## 2016-08-31 DIAGNOSIS — O98313 Other infections with a predominantly sexual mode of transmission complicating pregnancy, third trimester: Secondary | ICD-10-CM

## 2016-08-31 DIAGNOSIS — Z3400 Encounter for supervision of normal first pregnancy, unspecified trimester: Secondary | ICD-10-CM

## 2016-08-31 DIAGNOSIS — A5901 Trichomonal vulvovaginitis: Secondary | ICD-10-CM

## 2016-08-31 DIAGNOSIS — O23592 Infection of other part of genital tract in pregnancy, second trimester: Secondary | ICD-10-CM

## 2016-08-31 DIAGNOSIS — A6009 Herpesviral infection of other urogenital tract: Secondary | ICD-10-CM

## 2016-08-31 LAB — OB RESULTS CONSOLE GBS: GBS: POSITIVE

## 2016-08-31 NOTE — Progress Notes (Signed)
Discuss GBS results

## 2016-08-31 NOTE — Progress Notes (Signed)
   PRENATAL VISIT NOTE  Subjective:  Alyssa Goodman is a 26 y.o. G1P0 at 3613w5d being seen today for ongoing prenatal care.  She is currently monitored for the following issues for this low-risk pregnancy and has Asthma, chronic; Morbid obesity; Encounter for supervision of normal pregnancy, antepartum; Genital herpes complicating pregnancy; Obesity complicating pregnancy; Trichomonal vaginitis during pregnancy in second trimester; Heartburn during pregnancy in second trimester; Morbid obesity with BMI of 50.0-59.9, adult (HCC); and Positive GBS test on her problem list.  Patient reports no complaints.  Contractions: Irregular. Vag. Bleeding: None.  Movement: Present. Denies leaking of fluid.   The following portions of the patient's history were reviewed and updated as appropriate: allergies, current medications, past family history, past medical history, past social history, past surgical history and problem list. Problem list updated.  Objective:   Vitals:   08/31/16 1057  BP: 138/82  Pulse: 96  Weight: 297 lb 3.2 oz (134.8 kg)    Fetal Status: Fetal Heart Rate (bpm): 151 Fundal Height: 50 cm Movement: Present     General:  Alert, oriented and cooperative. Patient is in no acute distress.  Skin: Skin is warm and dry. No rash noted.   Cardiovascular: Normal heart rate noted  Respiratory: Normal respiratory effort, no problems with respiration noted  Abdomen: Soft, gravid, appropriate for gestational age. Pain/Pressure: Present     Pelvic:  Cervical exam deferred        Extremities: Normal range of motion.  Edema: Deep pitting, indentation remains for a short time  Mental Status: Normal mood and affect. Normal behavior. Normal judgment and thought content.   Assessment and Plan:  Pregnancy: G1P0 at 3313w5d  1. Obesity affecting pregnancy in third trimester     Has lost 7 lbs this pregnancy  2. Genital herpes affecting pregnancy in third trimester     On Valtrex daily  3.  Trichomonal vaginitis during pregnancy in second trimester      TOC negative  4. Positive GBS test     PCN for labor/delivery  5. Supervision of normal first pregnancy, antepartum      Doing well.  Labor s/s reviewed.    Preterm labor symptoms and general obstetric precautions including but not limited to vaginal bleeding, contractions, leaking of fluid and fetal movement were reviewed in detail with the patient. Please refer to After Visit Summary for other counseling recommendations.  Return in about 1 week (around 09/07/2016) for ROB.   Roe Coombsachelle A Tamarion Haymond, CNM

## 2016-09-08 ENCOUNTER — Encounter: Payer: Self-pay | Admitting: Advanced Practice Midwife

## 2016-09-15 ENCOUNTER — Ambulatory Visit (INDEPENDENT_AMBULATORY_CARE_PROVIDER_SITE_OTHER): Payer: Medicaid Other | Admitting: Certified Nurse Midwife

## 2016-09-15 DIAGNOSIS — Z3400 Encounter for supervision of normal first pregnancy, unspecified trimester: Secondary | ICD-10-CM

## 2016-09-15 DIAGNOSIS — B951 Streptococcus, group B, as the cause of diseases classified elsewhere: Secondary | ICD-10-CM

## 2016-09-15 DIAGNOSIS — O99213 Obesity complicating pregnancy, third trimester: Secondary | ICD-10-CM

## 2016-09-15 DIAGNOSIS — Z6841 Body Mass Index (BMI) 40.0 and over, adult: Secondary | ICD-10-CM

## 2016-09-15 DIAGNOSIS — Z3403 Encounter for supervision of normal first pregnancy, third trimester: Secondary | ICD-10-CM

## 2016-09-15 NOTE — Progress Notes (Signed)
   PRENATAL VISIT NOTE  Subjective:  Alyssa Goodman is a 26 y.o. G1P0 at 9149w6d being seen today for ongoing prenatal care.  She is currently monitored for the following issues for this low-risk pregnancy and has Asthma, chronic; Morbid obesity; Encounter for supervision of normal pregnancy, antepartum; Genital herpes complicating pregnancy; Obesity complicating pregnancy; Trichomonal vaginitis during pregnancy in second trimester; Heartburn during pregnancy in second trimester; Morbid obesity with BMI of 50.0-59.9, adult (HCC); and Positive GBS test on her problem list.  Patient reports no complaints.  Contractions: Irregular. Vag. Bleeding: None.  Movement: Present. Denies leaking of fluid.   The following portions of the patient's history were reviewed and updated as appropriate: allergies, current medications, past family history, past medical history, past social history, past surgical history and problem list. Problem list updated.  Objective:   Vitals:   09/15/16 0923  BP: 120/78  Pulse: 90  Weight: 298 lb (135.2 kg)    Fetal Status: Fetal Heart Rate (bpm): 142 Fundal Height: 50 cm Movement: Present     General:  Alert, oriented and cooperative. Patient is in no acute distress.  Skin: Skin is warm and dry. No rash noted.   Cardiovascular: Normal heart rate noted  Respiratory: Normal respiratory effort, no problems with respiration noted  Abdomen: Soft, gravid, appropriate for gestational age. Pain/Pressure: Present     Pelvic:  Cervical exam deferred        Extremities: Normal range of motion.  Edema: Deep pitting, indentation remains for a short time  Mental Status: Normal mood and affect. Normal behavior. Normal judgment and thought content.   Assessment and Plan:  Pregnancy: G1P0 at 6049w6d  1. Morbid obesity       2. Supervision of normal first pregnancy, antepartum      Doing well  3. Morbid obesity with BMI of 50.0-59.9, adult (HCC)     Lost 7 lbs this  pregnancy  4. Positive GBS test     PCN for labor/delivery  Term labor symptoms and general obstetric precautions including but not limited to vaginal bleeding, contractions, leaking of fluid and fetal movement were reviewed in detail with the patient. Please refer to After Visit Summary for other counseling recommendations.  Return in about 1 week (around 09/22/2016) for ROB.   Roe Coombsachelle A Tate Zagal, CNM

## 2016-09-26 ENCOUNTER — Ambulatory Visit (INDEPENDENT_AMBULATORY_CARE_PROVIDER_SITE_OTHER): Payer: Medicaid Other | Admitting: Certified Nurse Midwife

## 2016-09-26 ENCOUNTER — Telehealth (HOSPITAL_COMMUNITY): Payer: Self-pay | Admitting: *Deleted

## 2016-09-26 DIAGNOSIS — Z3400 Encounter for supervision of normal first pregnancy, unspecified trimester: Secondary | ICD-10-CM

## 2016-09-26 DIAGNOSIS — Z6841 Body Mass Index (BMI) 40.0 and over, adult: Secondary | ICD-10-CM

## 2016-09-26 DIAGNOSIS — B951 Streptococcus, group B, as the cause of diseases classified elsewhere: Secondary | ICD-10-CM

## 2016-09-26 DIAGNOSIS — Z3403 Encounter for supervision of normal first pregnancy, third trimester: Secondary | ICD-10-CM

## 2016-09-26 DIAGNOSIS — O480 Post-term pregnancy: Secondary | ICD-10-CM

## 2016-09-26 NOTE — Telephone Encounter (Signed)
Preadmission screen  

## 2016-09-26 NOTE — Progress Notes (Signed)
   PRENATAL VISIT NOTE  Subjective:  Alyssa Goodman is a 26 y.o. G1P0 at 1828w3d being seen today for ongoing prenatal care.  She is currently monitored for the following issues for this low-risk pregnancy and has Asthma, chronic; Morbid obesity; Encounter for supervision of normal pregnancy, antepartum; Genital herpes complicating pregnancy; Obesity complicating pregnancy; Trichomonal vaginitis during pregnancy in second trimester; Heartburn during pregnancy in second trimester; Morbid obesity with BMI of 50.0-59.9, adult (HCC); and Positive GBS test on her problem list.  Patient reports no complaints.  Contractions: Irregular. Vag. Bleeding: None.  Movement: Present. Denies leaking of fluid.   The following portions of the patient's history were reviewed and updated as appropriate: allergies, current medications, past family history, past medical history, past social history, past surgical history and problem list. Problem list updated.  Objective:   Vitals:   09/26/16 1112  BP: 109/71  Pulse: 87  Weight: 296 lb (134.3 kg)    Fetal Status: Fetal Heart Rate (bpm): NST; reactive Fundal Height: 50 cm Movement: Present     General:  Alert, oriented and cooperative. Patient is in no acute distress.  Skin: Skin is warm and dry. No rash noted.   Cardiovascular: Normal heart rate noted  Respiratory: Normal respiratory effort, no problems with respiration noted  Abdomen: Soft, gravid, appropriate for gestational age.  Pain/Pressure: Present     Pelvic: Cervical exam deferred        Extremities: Normal range of motion.     Mental Status:  Normal mood and affect. Normal behavior. Normal judgment and thought content.  NST: + accels, no decels, moderate variability, Cat. 1 tracing. No contractions on toco.   Assessment and Plan:  Pregnancy: G1P0 at 6628w3d  1. Morbid obesity     Has lost 9lbs this pregnancy  2. Supervision of normal first pregnancy, antepartum     Doing well.  NST for  postdates.  Reactive.   3. Morbid obesity with BMI of 50.0-59.9, adult (HCC)     4. Positive GBS test     PCN for labor/delivery  Term labor symptoms and general obstetric precautions including but not limited to vaginal bleeding, contractions, leaking of fluid and fetal movement were reviewed in detail with the patient. Please refer to After Visit Summary for other counseling recommendations.  Return in about 4 weeks (around 10/24/2016) for Postpartum.   Roe Coombsachelle A Alizaya Oshea, CNM

## 2016-09-27 ENCOUNTER — Encounter (HOSPITAL_COMMUNITY): Payer: Self-pay | Admitting: General Practice

## 2016-09-27 ENCOUNTER — Inpatient Hospital Stay (HOSPITAL_COMMUNITY): Payer: Medicaid Other | Admitting: Anesthesiology

## 2016-09-27 ENCOUNTER — Inpatient Hospital Stay (HOSPITAL_COMMUNITY)
Admission: AD | Admit: 2016-09-27 | Discharge: 2016-09-30 | DRG: 774 | Disposition: A | Discharge status: Home / Self Care | Payer: Medicaid Other | Source: Ambulatory Visit | Attending: Family Medicine | Admitting: Family Medicine

## 2016-09-27 DIAGNOSIS — A6000 Herpesviral infection of urogenital system, unspecified: Secondary | ICD-10-CM | POA: Diagnosis present

## 2016-09-27 DIAGNOSIS — Z3A40 40 weeks gestation of pregnancy: Secondary | ICD-10-CM

## 2016-09-27 DIAGNOSIS — O99214 Obesity complicating childbirth: Secondary | ICD-10-CM | POA: Diagnosis present

## 2016-09-27 DIAGNOSIS — O99824 Streptococcus B carrier state complicating childbirth: Secondary | ICD-10-CM | POA: Diagnosis not present

## 2016-09-27 DIAGNOSIS — Z6841 Body Mass Index (BMI) 40.0 and over, adult: Secondary | ICD-10-CM

## 2016-09-27 DIAGNOSIS — O480 Post-term pregnancy: Secondary | ICD-10-CM | POA: Diagnosis not present

## 2016-09-27 DIAGNOSIS — J45909 Unspecified asthma, uncomplicated: Secondary | ICD-10-CM | POA: Diagnosis present

## 2016-09-27 DIAGNOSIS — O9832 Other infections with a predominantly sexual mode of transmission complicating childbirth: Secondary | ICD-10-CM | POA: Diagnosis present

## 2016-09-27 DIAGNOSIS — O9952 Diseases of the respiratory system complicating childbirth: Secondary | ICD-10-CM | POA: Diagnosis present

## 2016-09-27 DIAGNOSIS — Z87891 Personal history of nicotine dependence: Secondary | ICD-10-CM | POA: Diagnosis not present

## 2016-09-27 DIAGNOSIS — Z3A41 41 weeks gestation of pregnancy: Secondary | ICD-10-CM | POA: Diagnosis not present

## 2016-09-27 DIAGNOSIS — O26893 Other specified pregnancy related conditions, third trimester: Secondary | ICD-10-CM | POA: Diagnosis present

## 2016-09-27 LAB — CBC
HEMATOCRIT: 39.3 % (ref 36.0–46.0)
HEMOGLOBIN: 12.7 g/dL (ref 12.0–15.0)
MCH: 27.3 pg (ref 26.0–34.0)
MCHC: 32.3 g/dL (ref 30.0–36.0)
MCV: 84.3 fL (ref 78.0–100.0)
Platelets: 207 10*3/uL (ref 150–400)
RBC: 4.66 MIL/uL (ref 3.87–5.11)
RDW: 16 % — ABNORMAL HIGH (ref 11.5–15.5)
WBC: 12.8 10*3/uL — ABNORMAL HIGH (ref 4.0–10.5)

## 2016-09-27 LAB — TYPE AND SCREEN
ABO/RH(D): O POS
Antibody Screen: NEGATIVE

## 2016-09-27 LAB — POCT FERN TEST: POCT Fern Test: POSITIVE

## 2016-09-27 MED ORDER — EPHEDRINE 5 MG/ML INJ
10.0000 mg | INTRAVENOUS | Status: DC | PRN
  Filled 2016-09-27: qty 2

## 2016-09-27 MED ORDER — OXYTOCIN 40 UNITS IN LACTATED RINGERS INFUSION - SIMPLE MED
2.5000 [IU]/h | INTRAVENOUS | Status: DC
  Filled 2016-09-27: qty 1000

## 2016-09-27 MED ORDER — TERBUTALINE SULFATE 1 MG/ML IJ SOLN: 0.2500 mg | Freq: Once | INTRAMUSCULAR | Status: DC | PRN

## 2016-09-27 MED ORDER — LACTATED RINGERS IV SOLN: 500.0000 mL | INTRAVENOUS | Status: DC | PRN

## 2016-09-27 MED ORDER — OXYTOCIN BOLUS FROM INFUSION
500.0000 mL | Freq: Once | INTRAVENOUS | Status: AC
  Administered 2016-09-28: 500 mL via INTRAVENOUS

## 2016-09-27 MED ORDER — ONDANSETRON HCL 4 MG/2ML IJ SOLN
4.0000 mg | Freq: Four times a day (QID) | INTRAMUSCULAR | Status: DC | PRN
  Administered 2016-09-27 (×2): 4 mg via INTRAVENOUS
  Filled 2016-09-27 (×2): qty 2

## 2016-09-27 MED ORDER — PENICILLIN G POT IN DEXTROSE 60000 UNIT/ML IV SOLN
3.0000 10*6.[IU] | INTRAVENOUS | Status: DC
  Administered 2016-09-27 – 2016-09-28 (×3): 3 10*6.[IU] via INTRAVENOUS
  Filled 2016-09-27 (×6): qty 50

## 2016-09-27 MED ORDER — LIDOCAINE HCL (PF) 1 % IJ SOLN
30.0000 mL | INTRAMUSCULAR | Status: DC | PRN
  Administered 2016-09-28: 30 mL via SUBCUTANEOUS
  Filled 2016-09-27: qty 30

## 2016-09-27 MED ORDER — OXYCODONE-ACETAMINOPHEN 5-325 MG PO TABS: 1.0000 | ORAL_TABLET | ORAL | Status: DC | PRN

## 2016-09-27 MED ORDER — FENTANYL CITRATE (PF) 100 MCG/2ML IJ SOLN
100.0000 ug | INTRAMUSCULAR | Status: DC | PRN
  Administered 2016-09-27 (×2): 100 ug via INTRAVENOUS
  Filled 2016-09-27 (×3): qty 2

## 2016-09-27 MED ORDER — FENTANYL 2.5 MCG/ML BUPIVACAINE 1/10 % EPIDURAL INFUSION (WH - ANES)
14.0000 mL/h | INTRAMUSCULAR | Status: DC | PRN
  Administered 2016-09-27 – 2016-09-28 (×2): 14 mL/h via EPIDURAL
  Filled 2016-09-27 (×2): qty 100

## 2016-09-27 MED ORDER — PHENYLEPHRINE 40 MCG/ML (10ML) SYRINGE FOR IV PUSH (FOR BLOOD PRESSURE SUPPORT)
80.0000 ug | PREFILLED_SYRINGE | INTRAVENOUS | Status: DC | PRN
  Administered 2016-09-27: 80 ug via INTRAVENOUS
  Filled 2016-09-27: qty 5
  Filled 2016-09-27: qty 10

## 2016-09-27 MED ORDER — PENICILLIN G POTASSIUM 5000000 UNITS IJ SOLR
5.0000 10*6.[IU] | Freq: Once | INTRAVENOUS | Status: AC
  Administered 2016-09-27: 5 10*6.[IU] via INTRAVENOUS
  Filled 2016-09-27: qty 5

## 2016-09-27 MED ORDER — FLEET ENEMA 7-19 GM/118ML RE ENEM: 1.0000 | ENEMA | RECTAL | Status: DC | PRN

## 2016-09-27 MED ORDER — LIDOCAINE HCL (PF) 1 % IJ SOLN
INTRAMUSCULAR | Status: DC | PRN
  Administered 2016-09-27 (×2): 7 mL via EPIDURAL

## 2016-09-27 MED ORDER — DIPHENHYDRAMINE HCL 50 MG/ML IJ SOLN: 12.5000 mg | INTRAMUSCULAR | Status: DC | PRN

## 2016-09-27 MED ORDER — LACTATED RINGERS IV SOLN
500.0000 mL | Freq: Once | INTRAVENOUS | Status: AC
  Administered 2016-09-27: 500 mL via INTRAVENOUS

## 2016-09-27 MED ORDER — OXYCODONE-ACETAMINOPHEN 5-325 MG PO TABS: 2.0000 | ORAL_TABLET | ORAL | Status: DC | PRN

## 2016-09-27 MED ORDER — SOD CITRATE-CITRIC ACID 500-334 MG/5ML PO SOLN: 30.0000 mL | ORAL | Status: DC | PRN

## 2016-09-27 MED ORDER — ACETAMINOPHEN 325 MG PO TABS: 650.0000 mg | ORAL_TABLET | ORAL | Status: DC | PRN

## 2016-09-27 MED ORDER — LACTATED RINGERS IV SOLN
INTRAVENOUS | Status: DC
  Administered 2016-09-27: 11:00:00 via INTRAVENOUS

## 2016-09-27 MED ORDER — PHENYLEPHRINE 40 MCG/ML (10ML) SYRINGE FOR IV PUSH (FOR BLOOD PRESSURE SUPPORT)
80.0000 ug | PREFILLED_SYRINGE | INTRAVENOUS | Status: DC | PRN
  Filled 2016-09-27: qty 5

## 2016-09-27 MED ORDER — OXYTOCIN 40 UNITS IN LACTATED RINGERS INFUSION - SIMPLE MED
1.0000 m[IU]/min | INTRAVENOUS | Status: DC
  Administered 2016-09-27: 2 m[IU]/min via INTRAVENOUS

## 2016-09-27 NOTE — Progress Notes (Signed)
Patient ID: [name], female   DOB: 07/29/1990, 26 y.o.   MRN: [id number] Labor Progress Note  S: Patient seen & examined for progress of labor. Patient comfortable, but upset about progress of labor. Comforted and answered any questions she may have.   O: BP 105/69   Pulse 71   Temp 98.2 F (36.8 C) (Oral)   Resp 18   Ht 5\' 1"  (1.549 m)   Wt 134.3 kg (296 lb)   LMP 12/22/2015   SpO2 98%   BMI 55.93 kg/m   FHT: 130bpm, mod var, +accels, no decels TOCO: q 2-405min, patient looks comfortable during contractions  CVE: Dilation: 5 Effacement (%): 50 Cervical Position: Posterior Station: -3 Presentation: Vertex Exam by:: Dr. Talbert ForestShirley  A&P: 26 y.o. G1P0 7684w4d SROM @0810   Inserted IUPC Started on 2 milli-units of pitocin Continue current management Anticipate SVD  [place] Khara Renaud, DO [facility] Resident PGY-1 09/27/2016 3:10 PM

## 2016-09-27 NOTE — MAU Note (Signed)
+  LOF; clear since 810 +contractions following 5-10 min apart  Denies VB at this time. Denies any recent VE. +FM

## 2016-09-27 NOTE — Anesthesia Preprocedure Evaluation (Signed)
Anesthesia Evaluation  Patient identified by MRN, date of birth, ID band Patient awake    Reviewed: Allergy & Precautions, H&P , NPO status , Patient's Chart, lab work & pertinent test results  Airway Mallampati: III  TM Distance: >3 FB Neck ROM: full    Dental no notable dental hx. (+) Teeth Intact   Pulmonary former smoker,    Pulmonary exam normal breath sounds clear to auscultation       Cardiovascular negative cardio ROS Normal cardiovascular exam Rhythm:regular Rate:Normal     Neuro/Psych negative neurological ROS  negative psych ROS   GI/Hepatic negative GI ROS, Neg liver ROS,   Endo/Other  negative endocrine ROS  Renal/GU negative Renal ROS  negative genitourinary   Musculoskeletal negative musculoskeletal ROS (+)   Abdominal (+) + obese,   Peds  Hematology negative hematology ROS (+)   Anesthesia Other Findings   Reproductive/Obstetrics (+) Pregnancy                             Anesthesia Physical Anesthesia Plan  ASA: III  Anesthesia Plan: Epidural   Post-op Pain Management:    Induction:   PONV Risk Score and Plan:   Airway Management Planned:   Additional Equipment:   Intra-op Plan:   Post-operative Plan:   Informed Consent: I have reviewed the patients History and Physical, chart, labs and discussed the procedure including the risks, benefits and alternatives for the proposed anesthesia with the patient or authorized representative who has indicated his/her understanding and acceptance.     Plan Discussed with:   Anesthesia Plan Comments:         Anesthesia Quick Evaluation

## 2016-09-27 NOTE — Progress Notes (Signed)
   Alyssa Goodman is a 26 y.o. G1P0 at 7579w4d  admitted for rupture of membranes  Subjective: Patient feeling uncomfortable, has some cramps in her legs. Epidural is working.   Objective: Vitals:   09/27/16 1900 09/27/16 1930 09/27/16 2000 09/27/16 2030  BP: (!) 146/88 140/85 131/88 133/85  Pulse: 79 80 93 91  Resp: 18 16 16 16   Temp:   (!) 97.4 F (36.3 C)   TempSrc:   Oral   SpO2:      Weight:      Height:       No intake/output data recorded.  FHT:  FHR: 145 bpm, variability: moderate,  accelerations:  Abscent,  decelerations:  Absent UC:   irregular, every 2-3 minutes SVE:   Dilation: 8.5 Effacement (%): 100 Station: 0 Exam by:: Alyssa Salt. [name] RN  Pitocin @ 8 mu/min MVU at 100 Labs: Lab Results  Component Value Date   WBC 12.8 (H) 09/27/2016   HGB 12.7 09/27/2016   HCT 39.3 09/27/2016   MCV 84.3 09/27/2016   PLT 207 09/27/2016    Assessment / Plan: Spontaneous labor, progressing normally  Labor: Progressing normally Fetal Wellbeing:  Category I Pain Control:  Epidural Anticipated MOD:  NSVD Patient now positioned on her right side and peanut ball placed between her legs.  Continue to titrate pitocin per protocol to achieve adequate contractions.   Alyssa Goodman 09/27/2016, 9:04 PM

## 2016-09-27 NOTE — Anesthesia Pain Management Evaluation Note (Signed)
  CRNA Pain Management Visit Note  Patient: Alyssa Goodman, 26 y.o., female  "Hello I am a member of the anesthesia team at [facility]. We have an anesthesia team available at all times to provide care throughout the hospital, including epidural management and anesthesia for C-section. I don't know your plan for the delivery whether it a natural birth, water birth, IV sedation, nitrous supplementation, doula or epidural, but we want to meet your pain goals."   1.Was your pain managed to your expectations on prior hospitalizations?   No prior hospitalizations  2.What is your expectation for pain management during this hospitalization?     Labor support without medications  3.How can we help you reach that goal? Pt wishes for natural labor process without pain interventions  Record the patient's initial score and the patient's pain goal.   Pain: 8  Pain Goal: 10 [facility] wants you to be able to say your pain was always managed very well.  Alyssa Goodman 09/27/2016

## 2016-09-27 NOTE — Progress Notes (Signed)
Patient ID: Alyssa ArbourBianca L Goodman, female   DOB: 04/03/1990, 26 y.o.   MRN: [id number] Labor Progress Note  S: Patient seen & examined for progress of labor. Patient comfortable with epidural.   O: BP 122/69   Pulse 81   Temp 98.7 F (37.1 C) (Oral)   Resp 18   Ht 5\' 1"  (1.549 m)   Wt 134.3 kg (296 lb)   LMP 12/22/2015   SpO2 98%   BMI 55.93 kg/m   FHT: 140bpm, mod var, +accels, no decels TOCO: q2-7 min, patient looks comfortable during contractions  CVE: Dilation: 8.5 Effacement (%): 100 Cervical Position: Posterior Station: 0 Presentation: Vertex Exam by:: AThersa Goodman. [name] RN   A&P: 26 y.o. G1P0 6834w4d SROM @0810    IUPC replaced after falling out Currently on 6 milli-units of pitocin Continue current  management Anticipate SVD  [place]Jordan Zalmen Wrightsman, DO FM Resident PGY-1 09/27/2016 6:26 PM

## 2016-09-27 NOTE — Anesthesia Procedure Notes (Signed)
Epidural Patient location during procedure: OB Start time: 09/27/2016 3:48 PM End time: 09/27/2016 3:52 PM  Staffing Anesthesiologist: Leilani AbleHATCHETT, Nakaila Freeze Performed: anesthesiologist   Preanesthetic Checklist Completed: patient identified, surgical consent, pre-op evaluation, timeout performed, IV checked, risks and benefits discussed and monitors and equipment checked  Epidural Patient position: sitting Prep: site prepped and draped and DuraPrep Patient monitoring: continuous pulse ox and blood pressure Approach: midline Location: L3-L4 Injection technique: LOR air  Needle:  Needle type: Tuohy  Needle gauge: 17 G Needle length: 9 cm and 9 Needle insertion depth: 8 cm Catheter type: closed end flexible Catheter size: 19 Gauge Catheter at skin depth: 14 cm Test dose: negative and Other  Assessment Sensory level: T10 Events: blood not aspirated, injection not painful, no injection resistance, negative IV test and no paresthesia

## 2016-09-27 NOTE — Progress Notes (Signed)
LABOR PROGRESS NOTE  Alyssa ArbourBianca L Goodman is a 26 y.o. G1P0 at 3735w4d  admitted for SROM at 0810  Subjective: Pt is resting watching tv. Sh reports feeling better with the epidural in place and is not in pain. She is reporting feeling nauseased and wants to know if sh can have some nausea medication.   Objective: BP 131/88   Pulse 93   Temp 98.7 F (37.1 C) (Oral)   Resp 18   Ht 5\' 1"  (1.549 m)   Wt 134.3 kg (296 lb)   LMP 12/22/2015   SpO2 98%   BMI 55.93 kg/m  or  Vitals:   09/27/16 1830 09/27/16 1900 09/27/16 1930 09/27/16 2000  BP: 110/71 (!) 146/88 140/85 131/88  Pulse: (!) 104 79 80 93  Resp: 16 18    Temp:      TempSrc:      SpO2:      Weight:      Height:        FHR: base line 140 with moderate variability with out accelerations or decelerations   Dilation: 8.5 Effacement (%): 100 Cervical Position: Posterior Station: 0 Presentation: Vertex Exam by:: Alyssa CoganA. [name] RN   Labs: Lab Results  Component Value Date   WBC 12.8 (H) 09/27/2016   HGB 12.7 09/27/2016   HCT 39.3 09/27/2016   MCV 84.3 09/27/2016   PLT 207 09/27/2016    Patient Active Problem List   Diagnosis Date Noted  . Normal labor 09/27/2016  . Positive GBS test 08/30/2016  . Morbid obesity with BMI of 50.0-59.9, adult (HCC) 05/23/2016  . Trichomonal vaginitis during pregnancy in second trimester 05/02/2016  . Heartburn during pregnancy in second trimester 05/02/2016  . Genital herpes complicating pregnancy 03/01/2016  . Obesity complicating pregnancy 03/01/2016  . Encounter for supervision of normal pregnancy, antepartum 02/26/2016  . Morbid obesity 06/28/2012  . Asthma, chronic 06/27/2012    Assessment / Plan: 26 y.o. G1P0 at 3635w4d here for SROM/SOL  Labor: active labor. Continue oxytocin. Contractions are currently <200 [place] units. reassess need for increased augmentation at next progress note. Fetal Wellbeing:  Category 1 Pain Control:  Epidural. Ondansetron for nausea, last dose at  1245 Anticipated MOD:  VSD  Alyssa Goodman,  09/27/2016, 8:38 PM

## 2016-09-27 NOTE — H&P (Signed)
LABOR ADMISSION HISTORY AND PHYSICAL  ELLIANNA Goodman is a 26 y.o. female G1P0 with IUP at [redacted]w[redacted]d by LMP presenting for SROM @0810 . She has a h/o of asthma. She reports +FMs, LOF- clear and cloudy, no VB, no blurry vision, no headaches, no peripheral edema, and no RUQ pain.  She plans on breast and bottle feeding. She is undecided for birth control.  Dating: By LMP --->  Estimated Date of Delivery: 09/23/16  Sono:   @[redacted]w[redacted]d , CWD, normal anatomy, cephalic presentation, 2600g, 16% EFW  Prenatal History/Complications:  Past Medical History: Past Medical History:  Diagnosis Date  . Asthma   . Gallstones   . Obesity   . Trichimoniasis     Past Surgical History: Past Surgical History:  Procedure Laterality Date  . NO PAST SURGERIES      Obstetrical History: OB History    Gravida Para Term Preterm AB Living   1             SAB TAB Ectopic Multiple Live Births                  Social History: Social History   Social History  . Marital status: Single    Spouse name: N/A  . Number of children: N/A  . Years of education: 25   Occupational History  . UNEMPLOYED Unenployed   Social History Main Topics  . Smoking status: Former Smoker    Types: Cigarettes  . Smokeless tobacco: Never Used     Comment: stopped with +UPT  . Alcohol use No  . Drug use: No  . Sexual activity: Yes    Birth control/ protection: None   Other Topics Concern  . None   Social History Narrative  . None    Family History: Family History  Problem Relation Age of Onset  . Hypertension Mother   . Diabetes Maternal Uncle   . Hypertension Maternal Uncle   . Diabetes Maternal Grandmother   . Hypertension Maternal Grandmother   . Heart disease Maternal Grandmother   . Cancer Maternal Grandfather        colon    Allergies: Allergies  Allergen Reactions  . Metronidazole Shortness Of Breath and Rash  . Kiwi Extract Itching  . Shellfish Allergy     Mostly oysters and limited to number of  shrimp.    Prescriptions Prior to Admission  Medication Sig Dispense Refill Last Dose  . acetaminophen (TYLENOL) 325 MG tablet Take 650 mg by mouth every 6 (six) hours as needed for mild pain or headache.   Past Week at Unknown time  . Prenatal Vit-Fe Fumarate-FA (MULTIVITAMIN-PRENATAL) 27-0.8 MG TABS tablet Take 1 tablet by mouth daily at 12 noon.   09/26/2016 at Unknown time  . valACYclovir (VALTREX) 1000 MG tablet Take 1 tablet (1,000 mg total) by mouth 2 (two) times daily. 60 tablet 2 09/26/2016 at Unknown time  . [DISCONTINUED] Elastic Bandages & Supports (COMFORT FIT MATERNITY SUPP LG) MISC 1 Units by Does not apply route daily. 1 each 0 Taking     Review of Systems   All systems reviewed and negative except as stated in HPI  Blood pressure 117/61, pulse 76, temperature 98.8 F (37.1 C), temperature source Oral, resp. rate 18, height 5\' 1"  (1.549 m), weight 134.3 kg (296 lb), last menstrual period 12/22/2015, SpO2 98 %. BMI 57 General appearance: alert and cooperative Extremities: Homans sign is negative, no sign of DVT Presentation: cephalic Fetal monitoringBaseline: 145 bpm, Variability: Good {> 6  bpm), Accelerations: Reactive and Decelerations: Absent Uterine activityFrequency: Irregular Dilation: 4 Effacement (%): 70 Station: -3 Exam by:: jolynn   Prenatal labs: ABO, Rh: O/Positive/-- (12/26 1310) Antibody: Negative (12/26 1310) Rubella: Immune RPR: Non Reactive (04/30 1123)  HBsAg: Negative (12/26 1310)  HIV:   Non-reactive GBS: Positive (06/22 1040)  Glucola- Fasting: 88, 1 hr: 129, 2 hr: 93  Prenatal Transfer Tool  Maternal Diabetes: No Genetic Screening: Normal Maternal Ultrasounds/Referrals: Normal Fetal Ultrasounds or other Referrals:  None Maternal Substance Abuse:  No Significant Maternal Medications:  None Significant Maternal Lab Results: Lab values include: Group B Strep positive  Results for orders placed or performed during the hospital encounter  of 09/27/16 (from the past 24 hour(s))  POCT fern test   Collection Time: 09/27/16 10:01 AM  Result Value Ref Range   POCT Fern Test Positive = ruptured amniotic membanes     Patient Active Problem List   Diagnosis Date Noted  . Normal labor 09/27/2016  . Positive GBS test 08/30/2016  . Morbid obesity with BMI of 50.0-59.9, adult (HCC) 05/23/2016  . Trichomonal vaginitis during pregnancy in second trimester 05/02/2016  . Heartburn during pregnancy in second trimester 05/02/2016  . Genital herpes complicating pregnancy 03/01/2016  . Obesity complicating pregnancy 03/01/2016  . Encounter for supervision of normal pregnancy, antepartum 02/26/2016  . Morbid obesity 06/28/2012  . Asthma, chronic 06/27/2012    Assessment: Alyssa ArbourBianca L Goodman is a 26 y.o. G1P0 at 5954w4d here for SROM @0810  with h/o asthma.   #Labor: Expectant management #Pain: Epidural upon request  #FWB: Cat 1 #ID: GBS pos- PCN #MOF: Breast and bottle #MOC:Undecided #Circ: N/A  [place] Shirley, DO Family Medicine Resident PGY-1  09/27/2016, 10:54 AM   OB FELLOW HISTORY AND PHYSICAL ATTESTATION  I have seen and examined this patient; I agree with above documentation in the resident's note.    Alyssa PearJulie P Kamryn Messineo, MD OB Fellow 09/27/2016, 11:11 AM

## 2016-09-28 ENCOUNTER — Encounter (HOSPITAL_COMMUNITY): Payer: Self-pay

## 2016-09-28 DIAGNOSIS — Z3A41 41 weeks gestation of pregnancy: Secondary | ICD-10-CM

## 2016-09-28 DIAGNOSIS — O99824 Streptococcus B carrier state complicating childbirth: Secondary | ICD-10-CM

## 2016-09-28 DIAGNOSIS — O480 Post-term pregnancy: Secondary | ICD-10-CM

## 2016-09-28 LAB — RPR: RPR Ser Ql: NONREACTIVE

## 2016-09-28 MED ORDER — ONDANSETRON HCL 4 MG PO TABS: 4.0000 mg | ORAL_TABLET | ORAL | Status: DC | PRN

## 2016-09-28 MED ORDER — SENNOSIDES-DOCUSATE SODIUM 8.6-50 MG PO TABS
2.0000 | ORAL_TABLET | ORAL | Status: DC
  Administered 2016-09-28 – 2016-09-30 (×2): 2 via ORAL
  Filled 2016-09-28 (×2): qty 2

## 2016-09-28 MED ORDER — ONDANSETRON HCL 4 MG/2ML IJ SOLN: 4.0000 mg | INTRAMUSCULAR | Status: DC | PRN

## 2016-09-28 MED ORDER — WITCH HAZEL-GLYCERIN EX PADS: 1.0000 "application " | MEDICATED_PAD | CUTANEOUS | Status: DC | PRN

## 2016-09-28 MED ORDER — ACETAMINOPHEN 325 MG PO TABS
650.0000 mg | ORAL_TABLET | ORAL | Status: DC | PRN
  Administered 2016-09-28 – 2016-09-30 (×4): 650 mg via ORAL
  Filled 2016-09-28 (×4): qty 2

## 2016-09-28 MED ORDER — BENZOCAINE-MENTHOL 20-0.5 % EX AERO
1.0000 "application " | INHALATION_SPRAY | CUTANEOUS | Status: DC | PRN
  Filled 2016-09-28: qty 56

## 2016-09-28 MED ORDER — SIMETHICONE 80 MG PO CHEW: 80.0000 mg | CHEWABLE_TABLET | ORAL | Status: DC | PRN

## 2016-09-28 MED ORDER — PRENATAL MULTIVITAMIN CH
1.0000 | ORAL_TABLET | Freq: Every day | ORAL | Status: DC
  Administered 2016-09-28 – 2016-09-29 (×2): 1 via ORAL
  Filled 2016-09-28 (×2): qty 1

## 2016-09-28 MED ORDER — TETANUS-DIPHTH-ACELL PERTUSSIS 5-2.5-18.5 LF-MCG/0.5 IM SUSP: 0.5000 mL | Freq: Once | INTRAMUSCULAR | Status: DC

## 2016-09-28 MED ORDER — DIPHENHYDRAMINE HCL 25 MG PO CAPS: 25.0000 mg | ORAL_CAPSULE | Freq: Four times a day (QID) | ORAL | Status: DC | PRN

## 2016-09-28 MED ORDER — DIBUCAINE 1 % RE OINT: 1.0000 "application " | TOPICAL_OINTMENT | RECTAL | Status: DC | PRN

## 2016-09-28 MED ORDER — IBUPROFEN 600 MG PO TABS
600.0000 mg | ORAL_TABLET | Freq: Four times a day (QID) | ORAL | Status: DC
  Administered 2016-09-28 – 2016-09-30 (×9): 600 mg via ORAL
  Filled 2016-09-28 (×9): qty 1

## 2016-09-28 MED ORDER — COCONUT OIL OIL: 1.0000 "application " | TOPICAL_OIL | Status: DC | PRN

## 2016-09-28 MED ORDER — ZOLPIDEM TARTRATE 5 MG PO TABS: 5.0000 mg | ORAL_TABLET | Freq: Every evening | ORAL | Status: DC | PRN

## 2016-09-28 NOTE — Progress Notes (Signed)
LABOR PROGRESS NOTE  Alyssa Goodman is a 26 y.o. G1P0 at 6533w5d  admitted for ROM  Subjective: Pt is uncomfortable and annoyed that she has not progressed faster and that her IUPC keeps coming out of place.   Objective: BP 125/78   Pulse (!) 121   Temp 99.4 F (37.4 C) (Oral)   Resp 16   Ht 5\' 1"  (1.549 m)   Wt 134.3 kg (296 lb)   LMP 12/22/2015   SpO2 98%   BMI 55.93 kg/m  or  Vitals:   09/28/16 0000 09/28/16 0030 09/28/16 0045 09/28/16 0100  BP: 113/68 128/83  125/78  Pulse: (!) 102 (!) 114  (!) 121  Resp: 16 16    Temp:   99.4 F (37.4 C)   TempSrc:   Oral   SpO2:      Weight:      Height:        FHR: baseline 170 with minimal variability, several early decelerations and no accelerations  Dilation: 10 Dilation Complete Date: 09/27/16 Dilation Complete Time: 2147 Effacement (%): 100 Cervical Position: Posterior Station: -1 Presentation: Vertex Exam by:: Kodie Pick, CNM  Labs: Lab Results  Component Value Date   WBC 12.8 (H) 09/27/2016   HGB 12.7 09/27/2016   HCT 39.3 09/27/2016   MCV 84.3 09/27/2016   PLT 207 09/27/2016    Patient Active Problem List   Diagnosis Date Noted  . Normal labor 09/27/2016  . Positive GBS test 08/30/2016  . Morbid obesity with BMI of 50.0-59.9, adult (HCC) 05/23/2016  . Trichomonal vaginitis during pregnancy in second trimester 05/02/2016  . Heartburn during pregnancy in second trimester 05/02/2016  . Genital herpes complicating pregnancy 03/01/2016  . Obesity complicating pregnancy 03/01/2016  . Encounter for supervision of normal pregnancy, antepartum 02/26/2016  . Morbid obesity 06/28/2012  . Asthma, chronic 06/27/2012    Assessment / Plan: 26 y.o. G1P0 at 3333w5d here for ROM  Labor: stage II, pt's IUPC keeps coming out of place making determination of adequacy very difficult. IUPC replaced, with pt sitting upright for 20 minutes before position change  Fetal Wellbeing:  Category II Pain Control:  Epidural Anticipated  MOD:  SVD  Sullivan LoneBrannon L Inman,  09/28/2016, 1:15 AM    I confirm that I have verified the information documented in the student's note and that I have also personally performed the physical exam and all medical decision making activities.

## 2016-09-28 NOTE — Anesthesia Postprocedure Evaluation (Signed)
Anesthesia Post Note  Patient: Alyssa Goodman  Procedure(s) Performed: * No procedures listed *     Patient location during evaluation: Mother Baby Anesthesia Type: Epidural Level of consciousness: awake and alert and oriented Pain management: pain level controlled Vital Signs Assessment: post-procedure vital signs reviewed and stable Respiratory status: spontaneous breathing and nonlabored ventilation Cardiovascular status: stable Postop Assessment: no headache, patient able to bend at knees, no backache, no signs of nausea or vomiting, epidural receding and adequate PO intake Anesthetic complications: no    Last Vitals:  Vitals:   09/28/16 0756 09/28/16 1144  BP: (!) 105/46 130/85  Pulse: 74 83  Resp: 16 18  Temp: 36.9 C     Last Pain:  Vitals:   09/28/16 1340  TempSrc:   PainSc: 6    Pain Goal: Patients Stated Pain Goal: 0 (09/27/16 0948)               Laban EmperorMalinova,Therisa Mennella Hristova

## 2016-09-29 LAB — CBC
HEMATOCRIT: 33.1 % — AB (ref 36.0–46.0)
HEMOGLOBIN: 10.7 g/dL — AB (ref 12.0–15.0)
MCH: 27.2 pg (ref 26.0–34.0)
MCHC: 32.3 g/dL (ref 30.0–36.0)
MCV: 84.2 fL (ref 78.0–100.0)
Platelets: 202 10*3/uL (ref 150–400)
RBC: 3.93 MIL/uL (ref 3.87–5.11)
RDW: 16.2 % — ABNORMAL HIGH (ref 11.5–15.5)
WBC: 13.4 10*3/uL — ABNORMAL HIGH (ref 4.0–10.5)

## 2016-09-29 MED ORDER — IBUPROFEN 600 MG PO TABS: 600.0000 mg | ORAL_TABLET | Freq: Four times a day (QID) | ORAL | 0 refills | Status: DC

## 2016-09-29 NOTE — Lactation Note (Signed)
This note was copied from a baby's chart. Lactation Consultation Note: This is mothers first child. She reports that she took [facility] breastfeeding class. Mother has had staff assistance with feedings . Latch scores of 7-9. Mother feels that infant is feeding well. She has offered small amts of formula with a bottle nipple. Advised mother to limit use of formula and limit use of pacifier. Mother encouraged to cue base feed infant and at least 8-12 times in 24 hours. Assist mother with proper hand technique to hand express colostrum. Observed large drops from left breast and none from the right breast. Mother has a DEBP sat up in her room. She has pumped several times and reports that she is not getting any colostrum out. Encouraged mother to feed infant more frequently. Mother has lots of visitors in her room at present. Advised mother to page for assistance for next feeding.  Lactation Brochure given to mother with information on all LC services.   Patient Name: Alyssa Genice RougeBianca Goodman Today's Date: 09/29/2016 Reason for consult: Initial assessment   Maternal Data Has patient been taught Hand Expression?: Yes Does the patient have breastfeeding experience prior to this delivery?: No  Feeding    LATCH Score/Interventions                      Lactation Tools Discussed/Used     Consult Status Consult Status: Follow-up Date: 09/29/16 Follow-up type: In-patient    Stevan BornKendrick, Lai Hendriks [facility] 09/29/2016, 2:21 PM

## 2016-09-29 NOTE — Plan of Care (Signed)
Problem: Activity: Goal: Ability to tolerate increased activity will improve Outcome: Completed/Met Date Met: 09/29/16 Patient is ambulating well without assistance.

## 2016-09-29 NOTE — Progress Notes (Signed)
Post Partum Day *#1 Subjective: no complaints, up ad lib, voiding, tolerating PO and reports normal lochia  Objective: Blood pressure 121/73, pulse 72, temperature 97.6 F (36.4 C), temperature source Oral, resp. rate 18, height 5\' 1"  (1.549 m), weight 134.3 kg (296 lb), last menstrual period 12/22/2015, SpO2 100 %, unknown if currently breastfeeding.  Physical Exam:  General: alert Lochia: appropriate Uterine Fundus: firm and NT at U DVT Evaluation: No evidence of DVT seen on physical exam.   Recent Labs  09/27/16 1031 09/29/16 0546  HGB 12.7 10.7*  HCT 39.3 33.1*    Assessment/Plan: Plan for discharge tomorrow  Breast/abstinence   LOS: 2 days   Alyssa Goodman 09/29/2016, 6:49 AM

## 2016-09-29 NOTE — Discharge Instructions (Signed)
Contraception Choices °Contraception, also called birth control, means things to use or ways to try not to get pregnant. °Hormonal birth control °This kind of birth control uses hormones. Here are some types of hormonal birth control: °· A tube that is put under skin of the arm (implant). The tube can stay in for as long as 3 years. °· Shots to get every 3 months (injections). °· Pills to take every day (birth control pills). °· A patch to change 1 time each week for 3 weeks (birth control patch). After that, the patch is taken off for 1 week. °· A ring to put in the vagina. The ring is left in for 3 weeks. Then it is taken out of the vagina for 1 week. Then a new ring is put in. °· Pills to take after unprotected sex (emergency birth control pills). ° °Barrier birth control °Here are some types of barrier birth control: °· A thin covering that is put on the penis before sex (female condom). The covering is thrown away after sex. °· A soft, loose covering that is put in the vagina before sex (female condom). The covering is thrown away after sex. °· A rubber bowl that sits over the cervix (diaphragm). The bowl must be made for you. The bowl is put into the vagina before sex. The bowl is left in for 6-8 hours after sex. It is taken out within 24 hours. °· A small, soft cup that fits over the cervix (cervical cap). The cup must be made for you. The cup can be left in for 6-8 hours after sex. It is taken out within 48 hours. °· A sponge that is put into the vagina before sex. It must be left in for at least 6 hours after sex. It must be taken out within 30 hours. Then it is thrown away. °· A chemical that kills or stops sperm from getting into the uterus (spermicide). It may be a pill, cream, jelly, or foam to put in the vagina. The chemical should be used at least 10-15 minutes before sex. ° °IUD (intrauterine) birth control °An IUD is a small, T-shaped piece of plastic. It is put inside the uterus. There are two  kinds: °· Hormone IUD. This kind can stay in for 3-5 years. °· Copper IUD. This kind can stay in for 10 years. ° °Permanent birth control °Here are some types of permanent birth control: °· Surgery to block the fallopian tubes. °· Having an insert put into each fallopian tube. °· Surgery to tie off the tubes that carry sperm (vasectomy). ° °Natural planning birth control °Here are some types of natural planning birth control: °· Not having sex on the days the woman could get pregnant. °· Using a calendar: °? To keep track of the length of each period. °? To find out what days pregnancy can happen. °? To plan to not have sex on days when pregnancy can happen. °· Watching for symptoms of ovulation and not having sex during ovulation. One way the woman can check for ovulation is to check her temperature. °· Waiting to have sex until after ovulation. ° °Summary °· Contraception, also called birth control, means things to use or ways to try not to get pregnant. °· Hormonal methods of birth control include implants, injections, pills, patches, vaginal rings, and emergency birth control pills. °· Barrier methods of birth control can include female condoms, female condoms, diaphragms, cervical caps, sponges, and spermicides. °· There are two types of   IUD (intrauterine device) birth control. An IUD can be put in a woman's uterus to prevent pregnancy for 3-5 years. °· Permanent sterilization can be done through a procedure for males, females, or both. °· Natural planning methods involve not having sex on the days when the woman could get pregnant. °This information is not intended to replace advice given to you by your health care provider. Make sure you discuss any questions you have with your health care provider. °Document Released: 12/19/2008 Document Revised: 03/03/2016 Document Reviewed: 03/03/2016 °Elsevier Interactive Patient Education © 2017 Else[employer] Inc[employer] °Home Care Instructions for Mom °ACTIVITY °· Gradually return to  your regular activities. °· Let yourself rest. Nap while your baby sleeps. °· Avoid lifting anything that is heavier than 10 lb (4.5 kg) until your health care provider says it is okay. °· Avoid activities that take a lot of effort and energy (are strenuous) until approved by your health care provider. Walking at a slow-to-moderate pace is usually safe. °· If you had a cesarean delivery: °? Do not vacuum, climb stairs, or drive a car for 4-6 weeks. °? Have someone help you at home until you feel like you can do your usual activities yourself. °? Do exercises as told by your health care provider, if this applies. ° °VAGINAL BLEEDING °You may continue to bleed for 4-6 weeks after delivery. Over time, the amount of blood usually decreases and the color of the blood usually gets lighter. However, the flow of bright red blood may increase if you have been too active. If you need to use more than one pad in an hour because your pad gets soaked, or if you pass a large clot: °· Lie down. °· Raise your feet. °· Place a cold compress on your lower abdomen. °· Rest. °· Call your health care provider. ° °If you are breastfeeding, your period should return anytime between 8 weeks after delivery and the time that you stop breastfeeding. If you are not breastfeeding, your period should return 6-8 weeks after delivery. °PERINEAL CARE °The perineal area, or perineum, is the part of your body between your thighs. After delivery, this area needs special care. Follow these instructions as told by your health care provider. °· Take warm tub baths for 15-20 minutes. °· Use medicated pads and pain-relieving sprays and creams as told. °· Do not use tampons or douches until vaginal bleeding has stopped. °· Each time you go to the bathroom: °? Use a peri bottle. °? Change your pad. °? Use towelettes in place of toilet paper until your stitches have healed. °· Do Kegel exercises every day. Kegel exercises help to maintain the muscles that  support the vagina, bladder, and bowels. You can do these exercises while you are standing, sitting, or lying down. To do Kegel exercises: °? Tighten the muscles of your abdomen and the muscles that surround your birth canal. °? Hold for a few seconds. °? Relax. °? Repeat until you have done this 5 times in a row. °· To prevent hemorrhoids from developing or getting worse: °? Drink enough fluid to keep your urine clear or pale yellow. °? Avoid straining when having a bowel movement. °? Take over-the-counter medicines and stool softeners as told by your health care provider. ° °BREAST CARE °· Wear a tight-fitting bra. °· Avoid taking over-the-counter pain medicine for breast discomfort. °· Apply ice to the breasts to help with discomfort as needed: °? Put ice in a plastic bag. °? Place a towel between your   skin and the bag. °? Leave the ice on for 20 minutes or as told by your health care provider. ° °NUTRITION °· Eat a well-balanced diet. °· Do not try to lose weight quickly by cutting back on calories. °· Take your prenatal vitamins until your postpartum checkup or until your health care provider tells you to stop. ° °POSTPARTUM DEPRESSION °You may find yourself crying for no apparent reason and unable to cope with all of the changes that come with having a newborn. This mood is called postpartum depression. Postpartum depression happens because your hormone levels change after delivery. If you have postpartum depression, get support from your partner, friends, and family. If the depression does not go away on its own after several weeks, contact your health care provider. °BREAST SELF-EXAM °Do a breast self-exam each month, at the same time of the month. If you are breastfeeding, check your breasts just after a feeding, when your breasts are less full. If you are breastfeeding and your period has started, check your breasts on day 5, 6, or 7 of your period. °Report any lumps, bumps, or discharge to your health  care provider. Know that breasts are normally lumpy if you are breastfeeding. This is temporary, and it is not a health risk. °INTIMACY AND SEXUALITY °Avoid sexual activity for at least 3-4 weeks after delivery or until the brownish-red vaginal flow is completely gone. If you want to avoid pregnancy, use some form of birth control. You can get pregnant after delivery, even if you have not had your period. °SEEK MEDICAL CARE IF: °· You feel unable to cope with the changes that a child brings to your life, and these feelings do not go away after several weeks. °· You notice a lump, a bump, or discharge on your breast. ° °SEEK IMMEDIATE MEDICAL CARE IF: °· Blood soaks your pad in 1 hour or less. °· You have: °? Severe pain or cramping in your lower abdomen. °? A bad-smelling vaginal discharge. °? A fever that is not controlled by medicine. °? A fever, and an area of your breast is red and sore. °? Pain or redness in your calf. °? Sudden, severe chest pain. °? Shortness of breath. °? Painful or bloody urination. °? Problems with your vision. °· You vomit for 12 hours or longer. °· You develop a severe headache. °· You have serious thoughts about hurting yourself, your child, or anyone else. ° °This information is not intended to replace advice given to you by your health care provider. Make sure you discuss any questions you have with your health care provider. °Document Released: 02/19/2000 Document Revised: 07/30/2015 Document Reviewed: 08/25/2014 °Elsevier Interactive Patient Education © 2017 Elsevier Inc. ° °

## 2016-09-30 ENCOUNTER — Inpatient Hospital Stay (HOSPITAL_COMMUNITY): Admission: RE | Admit: 2016-09-30 | Payer: Medicaid Other | Source: Ambulatory Visit

## 2016-09-30 NOTE — Discharge Summary (Signed)
OB Discharge Summary  Patient Name: Alyssa ArbourBianca L Goodman DOB: 06/26/1990 MRN: [id number]  Date of admission: 09/27/2016 Delivering MD: Marylene LandKOOISTRA, KATHRYN LORRAINE   Date of discharge: 09/30/2016  Admitting diagnosis: 40WKS,WATER BROKE Intrauterine pregnancy: 8127w5d     Secondary diagnosis:Active Problems:   Normal labor  Additional problems:morbid obesity, +GBS     Discharge diagnosis: Term Pregnancy Delivered                                                                     Augmentation: Pitocin  Complications: None  Hospital course:  Onset of Labor With Vaginal Delivery     26 y.o. yo G1P1001 at 6527w5d was admitted in Latent Labor on 09/27/2016. Patient had an uncomplicated labor course as follows:  Membrane Rupture Time/Date: 8:10 AM ,09/27/2016   Intrapartum Procedures: Episiotomy: None [1]                                         Lacerations:  2nd degree [3]  Patient had a delivery of a Viable infant. 09/28/2016  Information for the patient's newborn:  Hyman Biblevans, Girl Jeriyah [[id number]][[id number]]  Delivery Method: Vag-Spont    Pateint had an uncomplicated postpartum course.  She is ambulating, tolerating a regular diet, passing flatus, and urinating well. Patient is discharged home in stable condition on 09/30/16.   Physical exam  Vitals:   09/28/16 1832 09/29/16 0548 09/29/16 1815 09/30/16 0606  BP: (!) 110/55 121/73 (!) 110/56 110/77  Pulse: 77 72 75 86  Resp: 17 18 20 18   Temp: 98.1 F (36.7 C) 97.6 F (36.4 C) 98 F (36.7 C) 98.3 F (36.8 C)  TempSrc: Oral Oral Oral Oral  SpO2: 100%     Weight:      Height:       General: alert Lochia: appropriate Uterine Fundus: firm Incision: N/A DVT Evaluation: No evidence of DVT seen on physical exam. Labs: Lab Results  Component Value Date   WBC 13.4 (H) 09/29/2016   HGB 10.7 (L) 09/29/2016   HCT 33.1 (L) 09/29/2016   MCV 84.2 09/29/2016   PLT 202 09/29/2016   CMP Latest Ref Rng & Units 06/28/2016  Glucose 65 - 99 mg/dL 79   BUN 6 - 20 mg/dL 6  Creatinine 7.820.57 - 9.561.00 mg/dL 2.130.68  Sodium 086134 - 578144 mmol/L 138  Potassium 3.5 - 5.2 mmol/L 4.2  Chloride 96 - 106 mmol/L 100  CO2 18 - 29 mmol/L 21  Calcium 8.7 - 10.2 mg/dL 4.6(N8.6(L)  Total Protein 6.0 - 8.5 g/dL 6.0  Total Bilirubin 0.0 - 1.2 mg/dL 0.2  Alkaline Phos 39 - 117 IU/L 59  AST 0 - 40 IU/L 13  ALT 0 - 32 IU/L 11    Discharge instruction: per After Visit Summary and "Baby and Me Booklet".  After Visit Meds:  Allergies as of 09/30/2016      Reactions   Metronidazole Shortness Of Breath, Rash   Kiwi Extract Itching   Shellfish Allergy    Mostly oysters and limited to number of shrimp.      Medication List    TAKE these medications   acetaminophen  325 MG tablet Commonly known as:  TYLENOL Take 650 mg by mouth every 6 (six) hours as needed for mild pain or headache.   ibuprofen 600 MG tablet Commonly known as:  ADVIL,MOTRIN Take 1 tablet (600 mg total) by mouth every 6 (six) hours.   multivitamin-prenatal 27-0.8 MG Tabs tablet Take 1 tablet by mouth daily at 12 noon.   valACYclovir 1000 MG tablet Commonly known as:  VALTREX Take 1 tablet (1,000 mg total) by mouth 2 (two) times daily.       Diet: routine diet  Activity: Advance as tolerated. Pelvic rest for 6 weeks.   Outpatient follow up:4 weeks Follow up Appt:Future Appointments Date Time Provider Department Center  10/28/2016 8:30 AM Orvilla Cornwallenney, Rachelle A, CNM [facility] None   Follow up visit: No Follow-up on file.  Postpartum contraception: Undecided  Newborn Data: Live born female  Birth Weight: 7 lb 10.1 oz (3460 g) APGAR: 7, 9  Baby Feeding: Bottle Disposition:home with mother pending Peds rounding   09/30/2016 Allie BossierMyra C Triston Skare, MD

## 2016-10-03 ENCOUNTER — Ambulatory Visit (HOSPITAL_COMMUNITY)
Admission: EM | Admit: 2016-10-03 | Discharge: 2016-10-03 | Disposition: A | Discharge status: Home / Self Care | Payer: Medicaid Other | Attending: Physician Assistant | Admitting: Physician Assistant

## 2016-10-03 ENCOUNTER — Encounter (HOSPITAL_COMMUNITY): Payer: Self-pay | Admitting: *Deleted

## 2016-10-03 DIAGNOSIS — M654 Radial styloid tenosynovitis [de Quervain]: Secondary | ICD-10-CM

## 2016-10-03 NOTE — ED Provider Notes (Signed)
CSN: [id number]     Arrival date & time 10/03/16  1558 History   None    Chief Complaint  Patient presents with  . Wrist Pain   (Consider location/radiation/quality/duration/timing/severity/associated sxs/prior Treatment) 26 year old female comes in for exacerbation of bilateral wrist pain that she's been experiencing for the past 2 months. She states pain is mostly on the radial side of the wrist, she has not done anything for it. She gave birth to her baby a few days ago, and had exacerbation of the pain after lifting the baby up. 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 Denies numbness, tingling, swelling of the joint. Movement exacerbates the pain, has not found any alleviating factor.      Past Medical History:  Diagnosis Date  . Asthma   . Gallstones   . Obesity   . Trichimoniasis    Past Surgical History:  Procedure Laterality Date  . NO PAST SURGERIES     Family History  Problem Relation Age of Onset  . Hypertension Mother   . Diabetes Maternal Uncle   . Hypertension Maternal Uncle   . Diabetes Maternal Grandmother   . Hypertension Maternal Grandmother   . Heart disease Maternal Grandmother   . Cancer Maternal Grandfather        colon   Social History  Substance Use Topics  . Smoking status: Former Smoker    Types: Cigarettes  . Smokeless tobacco: Never Used     Comment: stopped with +UPT  . Alcohol use No   OB History    Gravida Para Term Preterm AB Living   1 1 1     1    SAB TAB Ectopic Multiple Live Births         0 1     Review of Systems  Reason unable to perform ROS: See history of present illness as above.    Allergies  Metronidazole; Kiwi extract; and Shellfish allergy  Home Medications   Prior to Admission medications   Medication Sig Start Date End Date Taking? Authorizing Provider  acetaminophen (TYLENOL) 325 MG tablet Take 650 mg by mouth every 6 (six) hours as needed for mild pain or headache.     [provider]  ibuprofen (ADVIL,MOTRIN) 600 MG tablet Take 1 tablet (600 mg total) by mouth every 6 (six) hours. 09/29/16   Allie Bossierove, Myra C, MD  Prenatal Vit-Fe Fumarate-FA (MULTIVITAMIN-PRENATAL) 27-0.8 MG TABS tablet Take 1 tablet by mouth daily at 12 noon.    [provider]  valACYclovir (VALTREX) 1000 MG tablet Take 1 tablet (1,000 mg total) by mouth 2 (two) times daily. 08/18/16   Roe Coombsenney, Rachelle A, CNM   Meds Ordered and Administered this Visit  Medications - No data to display  BP (!) 141/70 (BP Location: Left Arm) Comment: rn notified  Pulse 62   Temp 98 F (36.7 C) (Oral)   Resp 16   SpO2 100%   Breastfeeding? No  No data found.   Physical Exam  Constitutional: She is oriented to person, place, and time. She appears well-developed and well-nourished. No distress.  HENT:  Head: Normocephalic and atraumatic.  Musculoskeletal:  No tenderness of palpation of the elbows. Tenderness on palpation of the radial aspect of bilateral wrist. No tenderness on palpation of the ulnar aspect of the wrist. Full range of motion of the wrist. Mild pain on palpation of the thenar aspect. Full range of motion of the fingers. Sensation intact and equal bilaterally. Radial  pulses 2+ and equal bilaterally. Negative Tinel's, positive Finkelstein.   Neurological: She is alert and oriented to person, place, and time.  Skin: Skin is warm and dry.  Psychiatric: She has a normal mood and affect. Her behavior is normal. Judgment normal.    Urgent Care Course     Procedures (including critical care time)  Labs Review Labs Reviewed - No data to display  Imaging Review No results found.      MDM   1. Suzette Battieste Quervain's disease (radial styloid tenosynovitis)    Discussed with patient history and exam most consistent with de Quervain's. Given patient is planning on breast-feeding, will defer NSAIDs and prednisone at this time. Thumb spica for both wrists, ice compress as needed.  Patient to rest wrists, and avoid heavy lifting. Follow-up with PCP for further evaluation and referrals needed.   Belinda FisherYu, Myrle Dues V, PA-C 10/03/16 1729

## 2016-10-03 NOTE — ED Triage Notes (Signed)
Pt  Reports   Both   Wrists     Are  painfull   X  sev  Months      Hands   A  Lot  At   Work       Delivered  5  Days  Ago       No    Problems or    Symptoms   From  The   Delivery

## 2016-10-03 NOTE — Discharge Instructions (Signed)
Given you will possibly feed your baby breast milk, will defer medication treatment right now. Do not take medications such as ibuprofen, naproxen. Ice compress can help with inflammation. Wear thumb spica during activity/lifting to prevent further injury/irritation. Follow up with PCP for further evaluation and referrals needed.

## 2016-10-28 ENCOUNTER — Ambulatory Visit (INDEPENDENT_AMBULATORY_CARE_PROVIDER_SITE_OTHER): Payer: Medicaid Other | Admitting: Certified Nurse Midwife

## 2016-10-28 ENCOUNTER — Other Ambulatory Visit (HOSPITAL_COMMUNITY)
Admission: RE | Admit: 2016-10-28 | Discharge: 2016-10-28 | Disposition: A | Discharge status: Home / Self Care | Payer: Medicaid Other | Source: Ambulatory Visit | Attending: Certified Nurse Midwife | Admitting: Certified Nurse Midwife

## 2016-10-28 VITALS — BP 111/68 | HR 63 | Ht 61.0 in | Wt 278.7 lb

## 2016-10-28 DIAGNOSIS — Z124 Encounter for screening for malignant neoplasm of cervix: Secondary | ICD-10-CM

## 2016-10-28 DIAGNOSIS — N898 Other specified noninflammatory disorders of vagina: Secondary | ICD-10-CM

## 2016-10-28 NOTE — Progress Notes (Signed)
..  Post Partum Exam  Alyssa Goodman is a 26 y.o. G39P1001 female who presents for a postpartum visit. She is 4 weeks postpartum following a spontaneous vaginal delivery. I have fully reviewed the prenatal and intrapartum course. The delivery was at 40.5 gestational weeks.  Anesthesia: epidural. Postpartum course has been good. Baby's course has been good. Baby is feeding by bottle - Similac Advance. Bleeding staining only. Bowel function is abnormal: constipation, not taking anything for it, has not tried colace since delivery. Bladder function is normal. Patient is not sexually active. Contraception method is none. Postpartum depression screening:neg  The following portions of the patient's history were reviewed and updated as appropriate: allergies, current medications, past family history, past medical history, past social history, past surgical history and problem list.  Review of Systems Pertinent items noted in HPI and remainder of comprehensive ROS otherwise negative.    Objective:  not currently breastfeeding.  General:  alert, cooperative and no distress   Breasts:  inspection negative, no nipple discharge or bleeding, no masses or nodularity palpable  Lungs: clear to auscultation bilaterally  Heart:  regular rate and rhythm, S1, S2 normal, no murmur, click, rub or gallop  Abdomen: soft, non-tender; bowel sounds normal; no masses,  no organomegaly   Vulva:  normal  Vagina: normal vagina, no discharge, exudate, lesion, or erythema  Cervix:  no bleeding following Pap  Corpus: normal size, contour, position, consistency, mobility, non-tender  Adnexa:  normal adnexa  Rectal Exam: Not performed.        Assessment:    Normal 4 week postpartum exam. Pap smear done at today's visit.   Constipation: OTC Colace  Plan:   1. Contraception: abstinence 2. Declines contraception today.  May want in the future.  3. Follow up in: 1 year or as needed.

## 2016-10-28 NOTE — Progress Notes (Signed)
Patient states that she is currently not sexually active and is not interested in [facility] at the moment. Pt states that she may need a stool softener.

## 2016-10-31 ENCOUNTER — Encounter: Payer: Self-pay | Admitting: Certified Nurse Midwife

## 2016-10-31 LAB — CERVICOVAGINAL ANCILLARY ONLY
Bacterial vaginitis: NEGATIVE
Candida vaginitis: NEGATIVE
Chlamydia: NEGATIVE
Neisseria Gonorrhea: NEGATIVE
Trichomonas: NEGATIVE

## 2016-10-31 LAB — CYTOLOGY - PAP
DIAGNOSIS: NEGATIVE
HPV: NOT DETECTED

## 2016-11-01 ENCOUNTER — Encounter: Payer: Self-pay | Admitting: Certified Nurse Midwife

## 2017-10-10 ENCOUNTER — Other Ambulatory Visit: Payer: Self-pay

## 2017-10-10 ENCOUNTER — Ambulatory Visit (HOSPITAL_COMMUNITY)
Admission: EM | Admit: 2017-10-10 | Discharge: 2017-10-10 | Disposition: A | Discharge status: Home / Self Care | Payer: Medicaid Other | Attending: Family Medicine | Admitting: Family Medicine

## 2017-10-10 ENCOUNTER — Encounter (HOSPITAL_COMMUNITY): Payer: Self-pay

## 2017-10-10 DIAGNOSIS — R590 Localized enlarged lymph nodes: Secondary | ICD-10-CM

## 2017-10-10 MED ORDER — MELOXICAM 7.5 MG PO TABS: 7.5000 mg | ORAL_TABLET | Freq: Every day | ORAL | 0 refills | Status: DC

## 2017-10-10 NOTE — ED Triage Notes (Signed)
Pt has a knot on the outside of her right ear. 3 days

## 2017-10-10 NOTE — ED Provider Notes (Signed)
[facility]    CSN: [id number] Arrival date & time: 10/10/17  1556     History   Chief Complaint Chief Complaint  Patient presents with  . Otalgia    HPI Alyssa Goodman is a 27 y.o. female.   [age] year old female comes in for 3-day history of lesion to the right ear.  States now with tenderness to palpation, and can cause some pain to the jaw and the forehead.  Has had some cough without obvious rhinorrhea, nasal congestion.  Denies fever, chills, night sweats.  Denies sore throat.  Denies spreading erythema, increased warmth, drainage.  Has not tried anything for the symptoms.     Past Medical History:  Diagnosis Date  . Asthma   . Gallstones   . Obesity   . Trichimoniasis     Patient Active Problem List   Diagnosis Date Noted  . Morbid obesity with BMI of 50.0-59.9, adult (HCC) 05/23/2016  . Morbid obesity 06/28/2012  . Asthma, chronic 06/27/2012    Past Surgical History:  Procedure Laterality Date  . NO PAST SURGERIES      OB History    Gravida  1   Para  1   Term  1   Preterm      AB      Living  1     SAB      TAB      Ectopic      Multiple  0   Live Births  1            Home Medications    Prior to Admission medications   Medication Sig Start Date End Date Taking? Authorizing Provider  meloxicam (MOBIC) 7.5 MG tablet Take 1 tablet (7.5 mg total) by mouth daily. 10/10/17   Belinda FisherYu, Amy V, PA-C    Family History Family History  Problem Relation Age of Onset  . Hypertension Mother   . Diabetes Maternal Uncle   . Hypertension Maternal Uncle   . Diabetes Maternal Grandmother   . Hypertension Maternal Grandmother   . Heart disease Maternal Grandmother   . Cancer Maternal Grandfather        colon    Social History Social History   Tobacco Use  . Smoking status: Former Smoker    Types: Cigarettes  . Smokeless tobacco: Never Used  . Tobacco comment: stopped with +UPT  Substance Use Topics  . Alcohol use: No  . Drug  use: No     Allergies   Metronidazole; Kiwi extract; and Shellfish allergy   Review of Systems Review of Systems  Reason unable to perform ROS: See HPI as above.     Physical Exam Triage Vital Signs ED Triage Vitals  Enc Vitals Group     BP 10/10/17 1633 118/68     Pulse Rate 10/10/17 1633 70     Resp 10/10/17 1633 20     Temp 10/10/17 1633 97.9 F (36.6 C)     Temp Source 10/10/17 1633 Oral     SpO2 10/10/17 1633 100 %     Weight 10/10/17 1634 (!) 305 lb (138.3 kg)     Height --      Head Circumference --      Peak Flow --      Pain Score 10/10/17 1633 7     Pain Loc --      Pain Edu? --      Excl. in GC? --    No data found.  Updated  Vital Signs BP 118/68   Pulse 70   Temp 97.9 F (36.6 C) (Oral)   Resp 20   Wt (!) 305 lb (138.3 kg)   LMP 10/10/2017   SpO2 100%   BMI 57.63 kg/m   Physical Exam  Constitutional: She is oriented to person, place, and time. She appears well-developed and well-nourished. No distress.  HENT:  Head: Normocephalic and atraumatic.  Right Ear: Tympanic membrane, external ear and ear canal normal. Tympanic membrane is not erythematous and not bulging.  Left Ear: Tympanic membrane, external ear and ear canal normal. Tympanic membrane is not erythematous and not bulging.  Nose: Nose normal. Right sinus exhibits no maxillary sinus tenderness and no frontal sinus tenderness. Left sinus exhibits no maxillary sinus tenderness and no frontal sinus tenderness.  Mouth/Throat: Uvula is midline, oropharynx is clear and moist and mucous membranes are normal.  Eyes: Pupils are equal, round, and reactive to light. Conjunctivae are normal.  Neck: Normal range of motion. Neck supple.  Cardiovascular: Normal rate, regular rhythm and normal heart sounds. Exam reveals no gallop and no friction rub.  No murmur heard. Pulmonary/Chest: Effort normal and breath sounds normal. She has no decreased breath sounds. She has no wheezes. She has no rhonchi. She  has no rales.  Lymphadenopathy:       Head (right side): Preauricular adenopathy present.  Neurological: She is alert and oriented to person, place, and time.  Skin: Skin is warm and dry.  Psychiatric: She has a normal mood and affect. Her behavior is normal. Judgment normal.    UC Treatments / Results  Labs (all labs ordered are listed, but only abnormal results are displayed) Labs Reviewed - No data to display  EKG None  Radiology No results found.  Procedures Procedures (including critical care time)  Medications Ordered in UC Medications - No data to display  Initial Impression / Assessment and Plan / UC Course  I have reviewed the triage vital signs and the nursing notes.  Pertinent labs & imaging results that were available during my care of the patient were reviewed by me and considered in my medical decision making (see chart for details).    Mobic as directed.  Warm compress.  Return precautions given.  Patient expresses understanding and agrees to plan.  Final Clinical Impressions(s) / UC Diagnoses   Final diagnoses:  Preauricular lymphadenopathy    ED Prescriptions    Medication Sig Dispense Auth. Provider   meloxicam (MOBIC) 7.5 MG tablet Take 1 tablet (7.5 mg total) by mouth daily. 15 tablet Threasa Alpha, [place] 10/10/17 1728

## 2017-10-10 NOTE — Discharge Instructions (Signed)
Start mobic as directed. This could be caused by viral illness. Warm compress to the location. If experiencing spreading redness, increased warmth, fever, follow up for reevaluation needed.

## 2017-11-08 ENCOUNTER — Other Ambulatory Visit: Payer: Self-pay

## 2017-11-08 ENCOUNTER — Ambulatory Visit: Payer: Medicaid Other | Admitting: Obstetrics and Gynecology

## 2017-11-08 ENCOUNTER — Other Ambulatory Visit (HOSPITAL_COMMUNITY)
Admission: RE | Admit: 2017-11-08 | Discharge: 2017-11-08 | Disposition: A | Discharge status: Home / Self Care | Payer: Medicaid Other | Source: Ambulatory Visit | Attending: Obstetrics and Gynecology | Admitting: Obstetrics and Gynecology

## 2017-11-08 ENCOUNTER — Encounter: Payer: Self-pay | Admitting: Obstetrics and Gynecology

## 2017-11-08 VITALS — BP 112/67 | HR 79 | Ht 61.0 in | Wt 305.1 lb

## 2017-11-08 DIAGNOSIS — Z124 Encounter for screening for malignant neoplasm of cervix: Secondary | ICD-10-CM

## 2017-11-08 DIAGNOSIS — N760 Acute vaginitis: Secondary | ICD-10-CM | POA: Diagnosis not present

## 2017-11-08 DIAGNOSIS — N938 Other specified abnormal uterine and vaginal bleeding: Secondary | ICD-10-CM | POA: Diagnosis not present

## 2017-11-08 NOTE — Progress Notes (Signed)
GYN presents for abnormal cycles, missed period for 2 months then it came on for 3 weeks.  LMP 7/26-8/17/2019.   Also c/o pain 5/10 and malodorous discharge x 2 weeks. Last PAP 10/28/16

## 2017-11-08 NOTE — Progress Notes (Signed)
27 yo G1P1 with BMI 57 who is here for the evaluation of DUB. Patient reports a monthly period lasting 5 days but skipped 2 months ago and experienced a 3-week period. She reports the presence of a vaginal discharge 2 weeks ago with an odor. She is not sexually active and is not using any contraception. She denies any pelvic pain. Patient is changing her lifestyles in efforts to lose weight  Past Medical History:  Diagnosis Date  . Asthma   . Gallstones   . Obesity   . Trichimoniasis    Past Surgical History:  Procedure Laterality Date  . NO PAST SURGERIES     Family History  Problem Relation Age of Onset  . Hypertension Mother   . Diabetes Maternal Uncle   . Hypertension Maternal Uncle   . Diabetes Maternal Grandmother   . Hypertension Maternal Grandmother   . Heart disease Maternal Grandmother   . Cancer Maternal Grandfather        colon   Social History   Tobacco Use  . Smoking status: Former Smoker    Types: Cigarettes  . Smokeless tobacco: Never Used  . Tobacco comment: stopped with +UPT  Substance Use Topics  . Alcohol use: No  . Drug use: No   ROS See pertinent in HPI  Blood pressure 112/67, pulse 79, height 5\' 1"  (1.549 m), weight (!) 305 lb 1.6 oz (138.4 kg), last menstrual period 09/29/2017, not currently breastfeeding. GENERAL: Well-developed, well-nourished female in no acute distress.  LUNGS: Clear to auscultation bilaterally.  HEART: Regular rate and rhythm. ABDOMEN: Soft, nontender, nondistended. No organomegaly. PELVIC: Normal external female genitalia. Vagina is pink and rugated.  Normal discharge. Normal appearing cervix. Bimanual exam limited by body habitus. EXTREMITIES: No cyanosis, clubbing, or edema, 2+ distal pulses.  A/P 27 yo with DUB and vaginitis - pap smear collected - wet prep collected - pelvic ultrasound ordered - CBC and TSH ordered - Patient advised to keep a menstrual calendar - Discussed medical management of DUB with  contraception pending everything else normal. Patient is considering patch - Patient will be contacted with the results - RTC prn

## 2017-11-08 NOTE — Patient Instructions (Signed)
Contraception Choices Contraception, also called birth control, refers to methods or devices that prevent pregnancy. Hormonal methods Contraceptive implant A contraceptive implant is a thin, plastic tube that contains a hormone. It is inserted into the upper part of the arm. It can remain in place for up to 3 years. Progestin-only injections Progestin-only injections are injections of progestin, a synthetic form of the hormone progesterone. They are given every 3 months by a health care provider. Birth control pills Birth control pills are pills that contain hormones that prevent pregnancy. They must be taken once a day, preferably at the same time each day. Birth control patch The birth control patch contains hormones that prevent pregnancy. It is placed on the skin and must be changed once a week for three weeks and removed on the fourth week. A prescription is needed to use this method of contraception. Vaginal ring A vaginal ring contains hormones that prevent pregnancy. It is placed in the vagina for three weeks and removed on the fourth week. After that, the process is repeated with a new ring. A prescription is needed to use this method of contraception. Emergency contraceptive Emergency contraceptives prevent pregnancy after unprotected sex. They come in pill form and can be taken up to 5 days after sex. They work best the sooner they are taken after having sex. Most emergency contraceptives are available without a prescription. This method should not be used as your only form of birth control. Barrier methods Female condom A female condom is a thin sheath that is worn over the penis during sex. Condoms keep sperm from going inside a woman's body. They can be used with a spermicide to increase their effectiveness. They should be disposed after a single use. Female condom A female condom is a soft, loose-fitting sheath that is put into the vagina before sex. The condom keeps sperm from going  inside a woman's body. They should be disposed after a single use. Diaphragm A diaphragm is a soft, dome-shaped barrier. It is inserted into the vagina before sex, along with a spermicide. The diaphragm blocks sperm from entering the uterus, and the spermicide kills sperm. A diaphragm should be left in the vagina for 6-8 hours after sex and removed within 24 hours. A diaphragm is prescribed and fitted by a health care provider. A diaphragm should be replaced every 1-2 years, after giving birth, after gaining more than 15 lb (6.8 kg), and after pelvic surgery. Cervical cap A cervical cap is a round, soft latex or plastic cup that fits over the cervix. It is inserted into the vagina before sex, along with spermicide. It blocks sperm from entering the uterus. The cap should be left in place for 6-8 hours after sex and removed within 48 hours. A cervical cap must be prescribed and fitted by a health care provider. It should be replaced every 2 years. Sponge A sponge is a soft, circular piece of polyurethane foam with spermicide on it. The sponge helps block sperm from entering the uterus, and the spermicide kills sperm. To use it, you make it wet and then insert it into the vagina. It should be inserted before sex, left in for at least 6 hours after sex, and removed and thrown away within 30 hours. Spermicides Spermicides are chemicals that kill or block sperm from entering the cervix and uterus. They can come as a cream, jelly, suppository, foam, or tablet. A spermicide should be inserted into the vagina with an applicator at least 10-15 minutes before   sex to allow time for it to work. The process must be repeated every time you have sex. Spermicides do not require a prescription. Intrauterine contraception Intrauterine device (IUD) An IUD is a T-shaped device that is put in a woman's uterus. There are two types:  Hormone IUD.This type contains progestin, a synthetic form of the hormone progesterone. This  type can stay in place for 3-5 years.  Copper IUD.This type is wrapped in copper wire. It can stay in place for 10 years.  Permanent methods of contraception Female tubal ligation In this method, a woman's fallopian tubes are sealed, tied, or blocked during surgery to prevent eggs from traveling to the uterus. Hysteroscopic sterilization In this method, a small, flexible insert is placed into each fallopian tube. The inserts cause scar tissue to form in the fallopian tubes and block them, so sperm cannot reach an egg. The procedure takes about 3 months to be effective. Another form of birth control must be used during those 3 months. Female sterilization This is a procedure to tie off the tubes that carry sperm (vasectomy). After the procedure, the man can still ejaculate fluid (semen). Natural planning methods Natural family planning In this method, a couple does not have sex on days when the woman could become pregnant. Calendar method This means keeping track of the length of each menstrual cycle, identifying the days when pregnancy can happen, and not having sex on those days. Ovulation method In this method, a couple avoids sex during ovulation. Symptothermal method This method involves not having sex during ovulation. The woman typically checks for ovulation by watching changes in her temperature and in the consistency of cervical mucus. Post-ovulation method In this method, a couple waits to have sex until after ovulation. Summary  Contraception, also called birth control, means methods or devices that prevent pregnancy.  Hormonal methods of contraception include implants, injections, pills, patches, vaginal rings, and emergency contraceptives.  Barrier methods of contraception can include female condoms, female condoms, diaphragms, cervical caps, sponges, and spermicides.  There are two types of IUDs (intrauterine devices). An IUD can be put in a woman's uterus to prevent pregnancy  for 3-5 years.  Permanent sterilization can be done through a procedure for males, females, or both.  Natural family planning methods involve not having sex on days when the woman could become pregnant. This information is not intended to replace advice given to you by your health care provider. Make sure you discuss any questions you have with your health care provider. Document Released: 02/21/2005 Document Revised: 03/26/2016 Document Reviewed: 03/26/2016 Elsevier Interactive Patient Education  2018 [employer]vier Inc.  

## 2017-11-09 LAB — CERVICOVAGINAL ANCILLARY ONLY
Bacterial vaginitis: POSITIVE — AB
Candida vaginitis: NEGATIVE

## 2017-11-09 LAB — CBC
HEMATOCRIT: 36.8 % (ref 34.0–46.6)
Hemoglobin: 12.4 g/dL (ref 11.1–15.9)
MCH: 28.1 pg (ref 26.6–33.0)
MCHC: 33.7 g/dL (ref 31.5–35.7)
MCV: 83 fL (ref 79–97)
Platelets: 216 10*3/uL (ref 150–450)
RBC: 4.41 x10E6/uL (ref 3.77–5.28)
RDW: 14.6 % (ref 12.3–15.4)
WBC: 7.1 10*3/uL (ref 3.4–10.8)

## 2017-11-09 LAB — TSH: TSH: 2.12 u[IU]/mL (ref 0.450–4.500)

## 2017-11-10 LAB — CYTOLOGY - PAP: DIAGNOSIS: NEGATIVE

## 2017-11-10 MED ORDER — CLINDAMYCIN PHOSPHATE (1 DOSE) 2 % VA CREA: TOPICAL_CREAM | VAGINAL | 0 refills | Status: DC

## 2017-11-10 NOTE — Addendum Note (Signed)
Addended by: Catalina Antigua on: 11/10/2017 07:42 PM   Modules accepted: Orders

## 2017-11-14 ENCOUNTER — Ambulatory Visit (HOSPITAL_COMMUNITY)
Admission: RE | Admit: 2017-11-14 | Discharge: 2017-11-14 | Disposition: A | Discharge status: Home / Self Care | Payer: Medicaid Other | Source: Ambulatory Visit | Attending: Obstetrics and Gynecology | Admitting: Obstetrics and Gynecology

## 2017-11-14 DIAGNOSIS — N938 Other specified abnormal uterine and vaginal bleeding: Secondary | ICD-10-CM | POA: Diagnosis not present

## 2017-11-15 ENCOUNTER — Other Ambulatory Visit: Payer: Self-pay | Admitting: Obstetrics and Gynecology

## 2017-11-15 ENCOUNTER — Telehealth: Payer: Self-pay

## 2017-11-15 MED ORDER — NORELGESTROMIN-ETH ESTRADIOL 150-35 MCG/24HR TD PTWK: 1.0000 | MEDICATED_PATCH | TRANSDERMAL | 12 refills | Status: DC

## 2017-11-15 NOTE — Telephone Encounter (Signed)
Pt aware.

## 2017-11-15 NOTE — Telephone Encounter (Signed)
Pt called requesting a rx for the patch. Pt states that this was discussed at her last visit to help her cycles. Pt states that she is now ready to start this. Please review for rx

## 2018-08-14 IMAGING — US US OB COMP LESS 14 WK
1 series · 15 of 28 positions shown · non-contrast
Comparison: None.

CLINICAL DATA: Lower abdominal pain for 1 week. Estimated
gestational age by LMP is 4 weeks 6 days. Quantitative beta HCG is
pending.

EXAM:
OBSTETRIC <14 WK US AND TRANSVAGINAL OB US
TECHNIQUE: Both transabdominal and transvaginal ultrasound examinations were
performed for complete evaluation of the gestation as well as the
maternal uterus, adnexal regions, and pelvic cul-de-sac.
Transvaginal technique was performed to assess early pregnancy.

[Series 1: us ob comp less 14 wk · 15 of 43 slices shown]
[im 1/43]
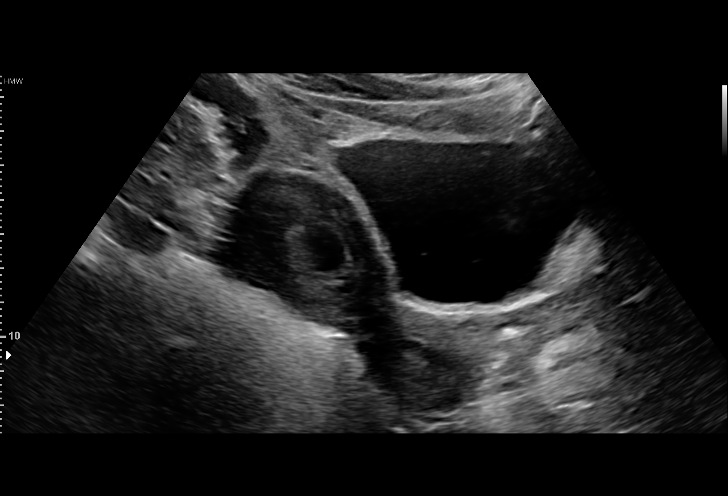
[im 4/43]
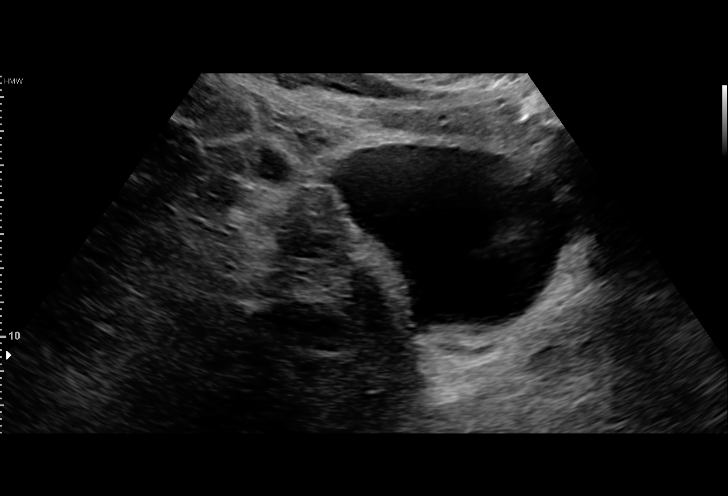
[im 7/43]
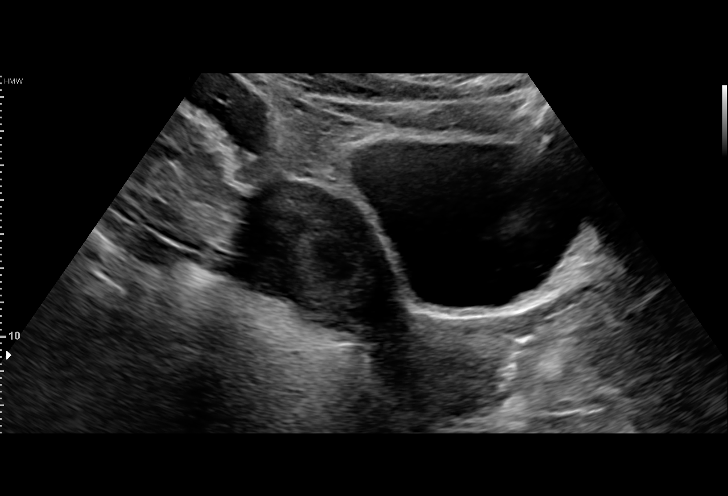
[im 10/43]
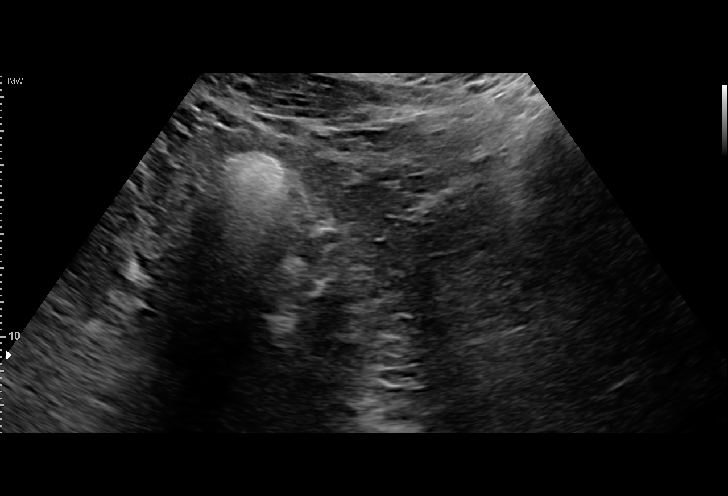
[im 13/43]
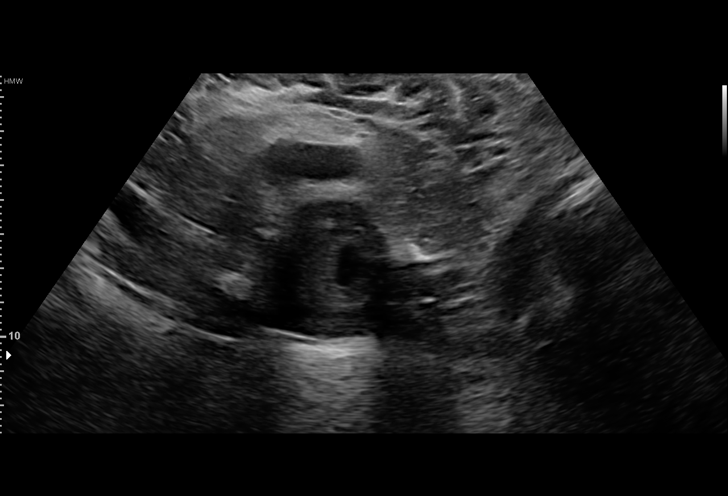
[im 16/43]
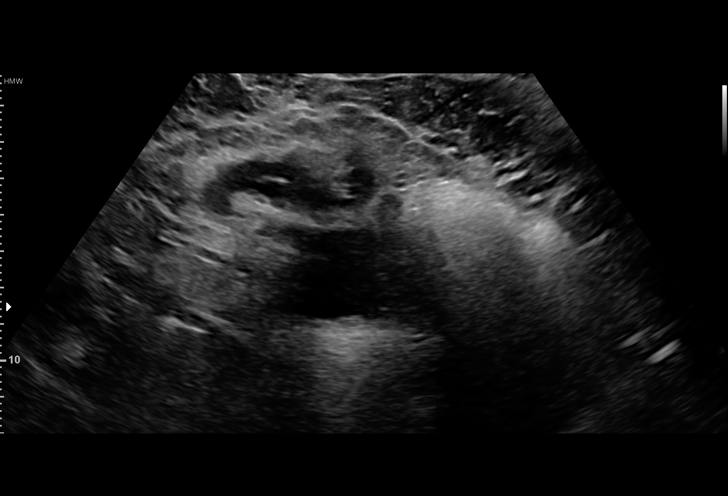
[im 19/43]
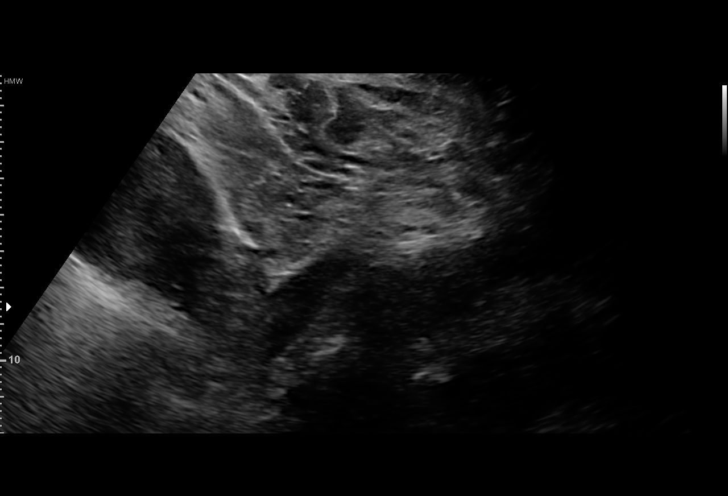
[im 22/43]
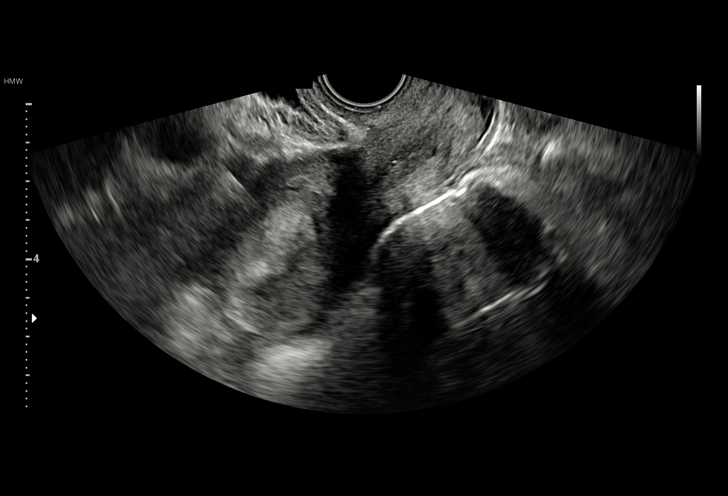
[im 24/43]
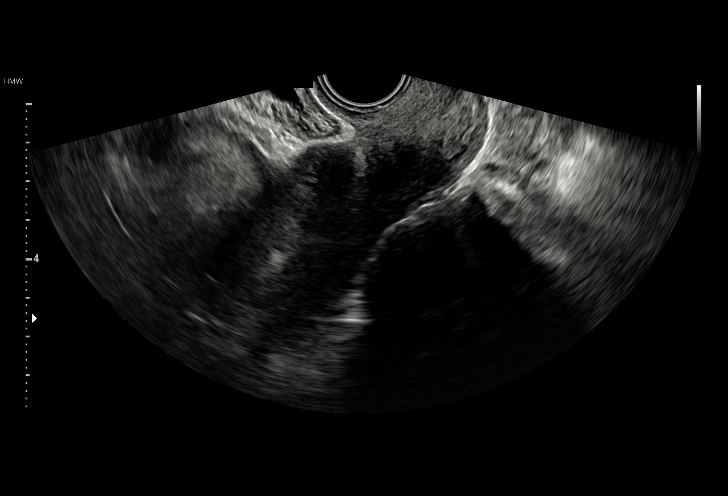
[im 27/43]
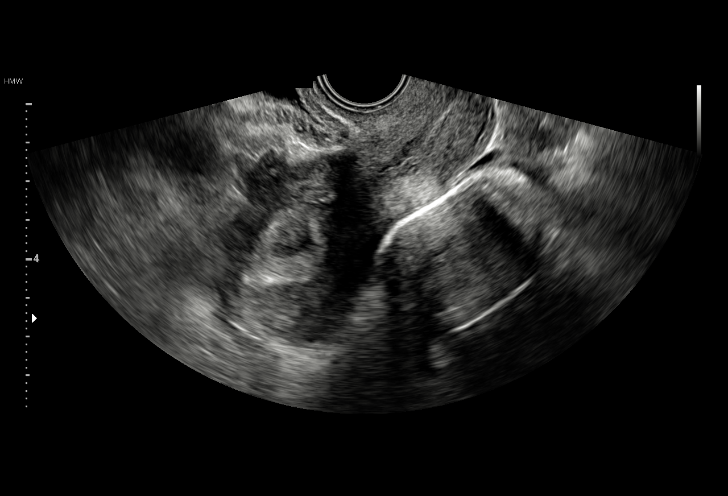
[im 30/43]
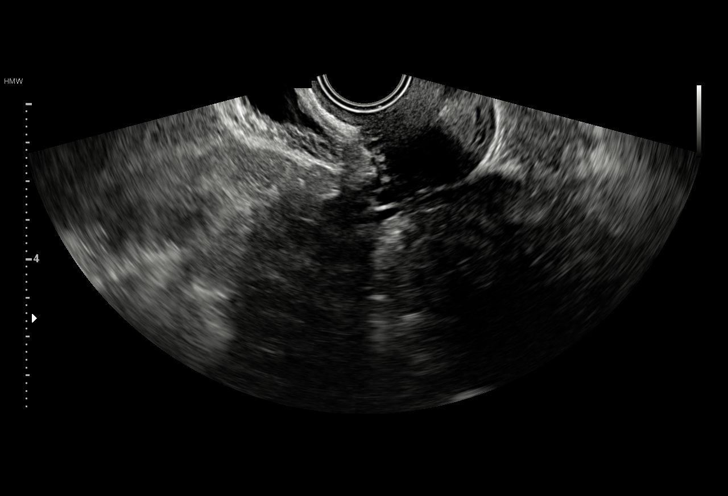
[im 33/43]
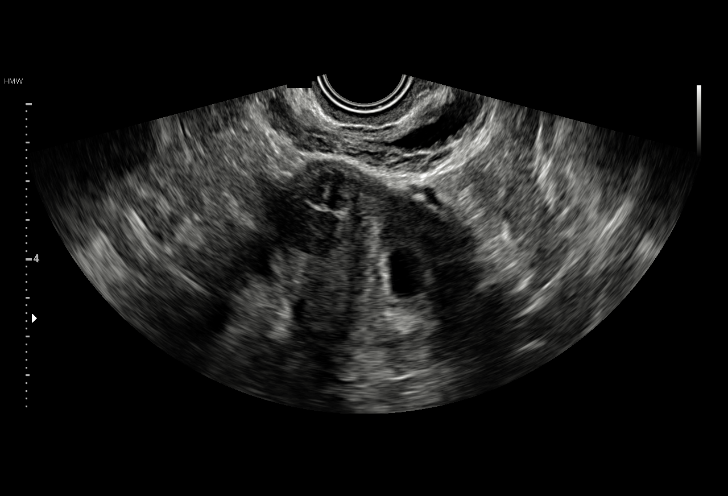
[im 36/43]
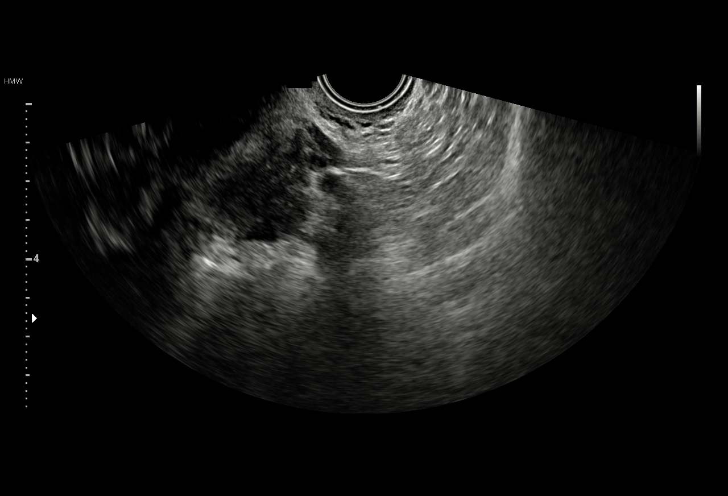
[im 39/43]
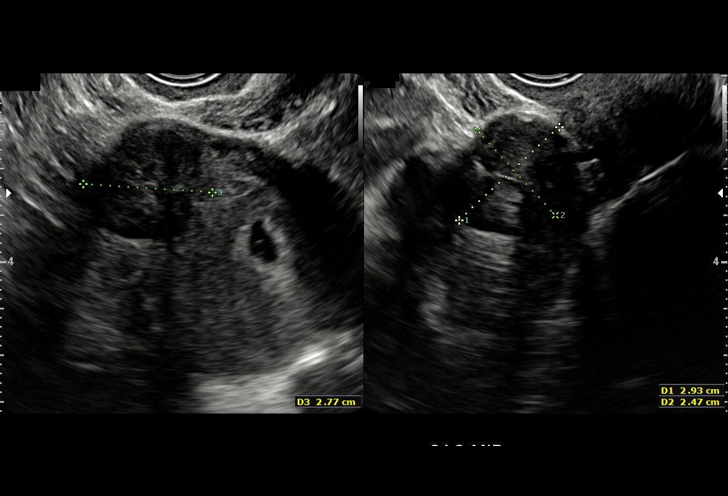
[im 43/43]
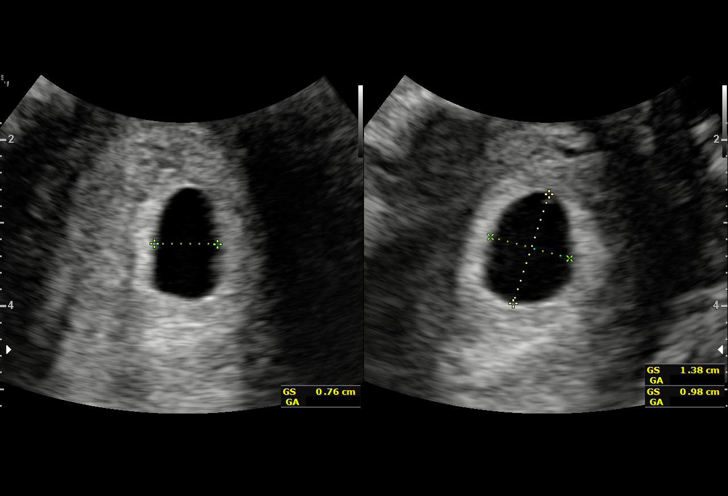

[15 of 28 positions shown; findings below may reference images not displayed]

FINDINGS: Intrauterine gestational sac: A single intrauterine gestational sac
is present.

Yolk sac:  Yolk sac is present.

Embryo:  Fetal pole is not definitely identified.

Cardiac Activity: Not identified.

MSD: 10.4  mm   5 w   5  d

Subchorionic hemorrhage:  None visualized.

Maternal uterus/adnexae: Uterus is anteverted. Hypodense myometrial
nodule anteriorly to the right measuring 3 x 2.5 x 2.8 cm consistent
with the uterine fibroid. Small nabothian cysts in the cervix.
Ovaries are not visualized but no abnormal adnexal masses are seen.
No free fluid in the pelvis.
IMPRESSION: Probable early intrauterine gestational sac with a yolk sac, but no
fetal pole or cardiac activity yet visualized. Recommend follow-up
quantitative B-HCG levels and follow-up US in 14 days to assess
viability. This recommendation follows SRU consensus guidelines:
Diagnostic Criteria for Nonviable Pregnancy Early in the First
Trimester. N Engl J Med 7395; [DATE].

## 2018-09-23 ENCOUNTER — Other Ambulatory Visit: Payer: Self-pay | Admitting: Obstetrics and Gynecology

## 2018-09-27 ENCOUNTER — Telehealth: Payer: Self-pay

## 2018-09-27 MED ORDER — XULANE 150-35 MCG/24HR TD PTWK: 1.0000 | MEDICATED_PATCH | TRANSDERMAL | 1 refills | Status: DC

## 2018-09-27 NOTE — Telephone Encounter (Signed)
Returned call about BC, no answer, left vm

## 2018-09-27 NOTE — Telephone Encounter (Signed)
S/w patient and pharmacy and verified that pt is out of [facility] refills, advised could send, pt due for annual in September.

## 2018-11-08 ENCOUNTER — Other Ambulatory Visit: Payer: Self-pay

## 2018-11-08 DIAGNOSIS — Z20822 Contact with and (suspected) exposure to covid-19: Secondary | ICD-10-CM

## 2018-11-09 LAB — NOVEL CORONAVIRUS, NAA: SARS-CoV-2, NAA: NOT DETECTED

## 2018-11-13 ENCOUNTER — Encounter: Payer: Self-pay | Admitting: Advanced Practice Midwife

## 2018-11-13 ENCOUNTER — Other Ambulatory Visit: Payer: Self-pay

## 2018-11-13 ENCOUNTER — Ambulatory Visit (INDEPENDENT_AMBULATORY_CARE_PROVIDER_SITE_OTHER): Payer: Medicaid Other | Admitting: Advanced Practice Midwife

## 2018-11-13 VITALS — BP 125/84 | HR 67 | Ht 61.0 in | Wt 298.8 lb

## 2018-11-13 DIAGNOSIS — Z0000 Encounter for general adult medical examination without abnormal findings: Secondary | ICD-10-CM | POA: Diagnosis not present

## 2018-11-13 DIAGNOSIS — M545 Low back pain, unspecified: Secondary | ICD-10-CM

## 2018-11-13 NOTE — Patient Instructions (Signed)
Preventive Care 21-27 Years Old, Female Preventive care refers to visits with your health care provider and lifestyle choices that can promote health and wellness. This includes:  A yearly physical exam. This may also be called an annual well check.  Regular dental visits and eye exams.  Immunizations.  Screening for certain conditions.  Healthy lifestyle choices, such as eating a healthy diet, getting regular exercise, not using drugs or products that contain nicotine and tobacco, and limiting alcohol use. What can I expect for my preventive care visit? Physical exam Your health care provider will check your:  Height and weight. This may be used to calculate body mass index (BMI), which tells if you are at a healthy weight.  Heart rate and blood pressure.  Skin for abnormal spots. Counseling Your health care provider may ask you questions about your:  Alcohol, tobacco, and drug use.  Emotional well-being.  Home and relationship well-being.  Sexual activity.  Eating habits.  Work and work environment.  Method of birth control.  Menstrual cycle.  Pregnancy history. What immunizations do I need?  Influenza (flu) vaccine  This is recommended every year. Tetanus, diphtheria, and pertussis (Tdap) vaccine  You may need a Td booster every 10 years. Varicella (chickenpox) vaccine  You may need this if you have not been vaccinated. Human papillomavirus (HPV) vaccine  If recommended by your health care provider, you may need three doses over 6 months. Measles, mumps, and rubella (MMR) vaccine  You may need at least one dose of MMR. You may also need a second dose. Meningococcal conjugate (MenACWY) vaccine  One dose is recommended if you are age 19-21 years and a first-year college student living in a residence hall, or if you have one of several medical conditions. You may also need additional booster doses. Pneumococcal conjugate (PCV13) vaccine  You may need  this if you have certain conditions and were not previously vaccinated. Pneumococcal polysaccharide (PPSV23) vaccine  You may need one or two doses if you smoke cigarettes or if you have certain conditions. Hepatitis A vaccine  You may need this if you have certain conditions or if you travel or work in places where you may be exposed to hepatitis A. Hepatitis B vaccine  You may need this if you have certain conditions or if you travel or work in places where you may be exposed to hepatitis B. Haemophilus influenzae type b (Hib) vaccine  You may need this if you have certain conditions. You may receive vaccines as individual doses or as more than one vaccine together in one shot (combination vaccines). Talk with your health care provider about the risks and benefits of combination vaccines. What tests do I need?  Blood tests  Lipid and cholesterol levels. These may be checked every 5 years starting at age 20.  Hepatitis C test.  Hepatitis B test. Screening  Diabetes screening. This is done by checking your blood sugar (glucose) after you have not eaten for a while (fasting).  Sexually transmitted disease (STD) testing.  BRCA-related cancer screening. This may be done if you have a family history of breast, ovarian, tubal, or peritoneal cancers.  Pelvic exam and Pap test. This may be done every 3 years starting at age 21. Starting at age 30, this may be done every 5 years if you have a Pap test in combination with an HPV test. Talk with your health care provider about your test results, treatment options, and if necessary, the need for more tests.   Follow these instructions at home: Eating and drinking   Eat a diet that includes fresh fruits and vegetables, whole grains, lean protein, and low-fat dairy.  Take vitamin and mineral supplements as recommended by your health care provider.  Do not drink alcohol if: ? Your health care provider tells you not to drink. ? You are  pregnant, may be pregnant, or are planning to become pregnant.  If you drink alcohol: ? Limit how much you have to 0-1 drink a day. ? Be aware of how much alcohol is in your drink. In the [place] one drink equals one 12 oz bottle of beer (355 mL), one 5 oz glass of wine (148 mL), or one 1 oz glass of hard liquor (44 mL). Lifestyle  Take daily care of your teeth and gums.  Stay active. Exercise for at least 30 minutes on 5 or more days each week.  Do not use any products that contain nicotine or tobacco, such as cigarettes, e-cigarettes, and chewing tobacco. If you need help quitting, ask your health care provider.  If you are sexually active, practice safe sex. Use a condom or other form of birth control (contraception) in order to prevent pregnancy and STIs (sexually transmitted infections). If you plan to become pregnant, see your health care provider for a preconception visit. What's next?  Visit your health care provider once a year for a well check visit.  Ask your health care provider how often you should have your eyes and teeth checked.  Stay up to date on all vaccines. This information is not intended to replace advice given to you by your health care provider. Make sure you discuss any questions you have with your health care provider. Document Released: 04/19/2001 Document Revised: 11/02/2017 Document Reviewed: 11/02/2017 Elsevier Patient Education  2020 Elsevier Inc.  

## 2018-11-13 NOTE — Progress Notes (Signed)
Pt is in the office for annual, last pap 11-08-17. Pt is currently on Xulane BC Patch, LMP 11-02-18. Pt states that she is currently not sexually active. GAD7= 2

## 2018-11-13 NOTE — Progress Notes (Signed)
GYNECOLOGY ANNUAL PREVENTATIVE CARE ENCOUNTER NOTE  History:     Alyssa Goodman is a 28 y.o. [id number] female here for a routine annual gynecologic exam.  Current complaints: no gyn complaints.   Denies abnormal vaginal bleeding, discharge, pelvic pain, problems with intercourse or other gynecologic concerns.    Patient reports one-year history of low back pain. She does not have a PCP and she is concerned that her pain is related to her epidural (SVD 09/2016). She denies severe or sharp pain but states it is recurrent, uncomfortable and distracting. She denies aggravating or alleviating factors and states her range of motion, lifting and carrying ability are not affected. She has not taken medication or tried other treatments for this complaint. She reports difficulty finding time to exercise, especially with her work schedule, family life and gyms being closed for quarantine.  Patient is s/p SVD 09/28/2016. Single and abstinent, lives with her parents, who are supportive. Works in [employer] full-time. Denies SI, HI, IPV. Happy with Burr MedicoXulane for contraception and has been on it since her SVD.    Gynecologic History Patient's last menstrual period was 11/02/2018. Contraception: Burr MedicoXulane Last Pap: 11/08/2017. Results were: normal with negative HPV Last mammogram: N/A age 28  Obstetric History OB History  Gravida Para Term Preterm AB Living  1 1 1     1   SAB TAB Ectopic Multiple Live Births        0 1    # Outcome Date GA Lbr Len/2nd Weight Sex Delivery Anes PTL Lv  1 Term 09/28/16 [id number] 03:37 / 06:02 7 lb 10.1 oz (3.46 kg) F Vag-Spont EPI  LIV    Past Medical History:  Diagnosis Date  . Asthma   . Gallstones   . Obesity   . Trichimoniasis     Past Surgical History:  Procedure Laterality Date  . NO PAST SURGERIES      Current Outpatient Medications on File Prior to Visit  Medication Sig Dispense Refill  . norelgestromin-ethinyl estradiol Burr Medico(XULANE) 150-35 MCG/24HR transdermal patch  Place 1 patch onto the skin once a week. 3 patch 1  . Clindamycin Phosphate, 1 Dose, vaginal cream 1 applicatorful per vagina for 1 dose at bedtime (Patient not taking: Reported on 11/13/2018) 5.8 g 0  . meloxicam (MOBIC) 7.5 MG tablet Take 1 tablet (7.5 mg total) by mouth daily. (Patient not taking: Reported on 11/08/2017) 15 tablet 0   No current facility-administered medications on file prior to visit.     Allergies  Allergen Reactions  . Metronidazole Shortness Of Breath and Rash  . Kiwi Extract Itching  . Shellfish Allergy     Mostly oysters and limited to number of shrimp.    Social History:  reports that she has quit smoking. Her smoking use included cigarettes. She has never used smokeless tobacco. She reports that she does not drink alcohol or use drugs.  Family History  Problem Relation Age of Onset  . Hypertension Mother   . Diabetes Maternal Uncle   . Hypertension Maternal Uncle   . Diabetes Maternal Grandmother   . Hypertension Maternal Grandmother   . Heart disease Maternal Grandmother   . Cancer Maternal Grandfather        colon    The following portions of the patient's history were reviewed and updated as appropriate: allergies, current medications, past family history, past medical history, past social history, past surgical history and problem list.  Review of Systems Pertinent items noted in HPI and remainder of  comprehensive ROS otherwise negative.  Physical Exam:  BP 125/84   Pulse 67   Ht 5\' 1"  (1.549 m)   Wt 298 lb 12.8 oz (135.5 kg)   LMP 11/02/2018   BMI 56.46 kg/m  CONSTITUTIONAL: Well-developed, well-nourished female in no acute distress.  HENT:  Normocephalic, atraumatic, External right and left ear normal. Oropharynx is clear and moist EYES: Conjunctivae and EOM are normal. Pupils are equal, round, and reactive to light. No scleral icterus.  NECK: Normal range of motion, supple, no masses.  Normal thyroid.  SKIN: Skin is warm and dry. No rash  noted. Not diaphoretic. No erythema. No pallor. MUSCULOSKELETAL: Normal range of motion. No tenderness.  No cyanosis, clubbing, or edema.  2+ distal pulses. NEUROLOGIC: Alert and oriented to person, place, and time. Normal reflexes, muscle tone coordination. No cranial nerve deficit noted. PSYCHIATRIC: Normal mood and affect. Normal behavior. Normal judgment and thought content. CARDIOVASCULAR: Normal heart rate noted, regular rhythm RESPIRATORY: Clear to auscultation bilaterally. Effort and breath sounds normal, no problems with respiration noted. BREASTS: Symmetric in size. No masses, skin changes, nipple drainage, or lymphadenopathy. ABDOMEN: Soft, normal bowel sounds, no distention noted.  No tenderness, rebound or guarding.    Assessment and Plan:    1. Well woman exam without gynecological exam - No concerning findings - Declines STI screening, pap - Discussed CDC Hallock criteria for contraceptive methods including COCs, Nexplanon, IUD. Pt declines further counseling - CBC - Hemoglobin A1c - Comprehensive metabolic panel - TSH - Lipid panel   2. Low back pain of over 3 months duration - Reviewed recommendations for diet and exercise, weight management - Encouraged patient to find PCP for ongoing management - Ambulatory referral to Physical Therapy - Ambulatory referral to Nutrition and Diabetic Education  Pap due 11/2020 Routine preventative health maintenance measures emphasized. Please refer to After Visit Summary for other counseling recommendations.     Total visit time 30 minutes. Greater than 50% of visit spent in counseling and coordination or care Mallie Snooks, MSN, CNM Certified Nurse Midwife, [facility] for [employer], [place] 11/13/18 11:41 AM

## 2018-11-14 LAB — COMPREHENSIVE METABOLIC PANEL
ALT: 13 IU/L (ref 0–32)
AST: 16 IU/L (ref 0–40)
Albumin/Globulin Ratio: 1.1 — ABNORMAL LOW (ref 1.2–2.2)
Albumin: 3.6 g/dL — ABNORMAL LOW (ref 3.9–5.0)
Alkaline Phosphatase: 46 IU/L (ref 39–117)
BUN/Creatinine Ratio: 13 (ref 9–23)
BUN: 12 mg/dL (ref 6–20)
Bilirubin Total: 0.3 mg/dL (ref 0.0–1.2)
CO2: 24 mmol/L (ref 20–29)
Calcium: 9 mg/dL (ref 8.7–10.2)
Chloride: 103 mmol/L (ref 96–106)
Creatinine, Ser: 0.91 mg/dL (ref 0.57–1.00)
GFR calc Af Amer: 100 mL/min/{1.73_m2} (ref >59)
GFR calc non Af Amer: 87 mL/min/{1.73_m2} (ref >59)
Globulin, Total: 3.4 g/dL (ref 1.5–4.5)
Glucose: 92 mg/dL (ref 65–99)
Potassium: 4.6 mmol/L (ref 3.5–5.2)
Sodium: 141 mmol/L (ref 134–144)
Total Protein: 7 g/dL (ref 6.0–8.5)

## 2018-11-14 LAB — CBC
Hematocrit: 39.4 % (ref 34.0–46.6)
Hemoglobin: 12.1 g/dL (ref 11.1–15.9)
MCH: 24.9 pg — ABNORMAL LOW (ref 26.6–33.0)
MCHC: 30.7 g/dL — ABNORMAL LOW (ref 31.5–35.7)
MCV: 81 fL (ref 79–97)
Platelets: 283 10*3/uL (ref 150–450)
RBC: 4.86 x10E6/uL (ref 3.77–5.28)
RDW: 13.9 % (ref 11.7–15.4)
WBC: 8.7 10*3/uL (ref 3.4–10.8)

## 2018-11-14 LAB — LIPID PANEL
Chol/HDL Ratio: 2.9 ratio (ref 0.0–4.4)
Cholesterol, Total: 152 mg/dL (ref 100–199)
HDL: 53 mg/dL (ref >39)
LDL Chol Calc (NIH): 74 mg/dL (ref 0–99)
Triglycerides: 144 mg/dL (ref 0–149)
VLDL Cholesterol Cal: 25 mg/dL (ref 5–40)

## 2018-11-14 LAB — TSH: TSH: 2.33 u[IU]/mL (ref 0.450–4.500)

## 2018-11-14 LAB — HEMOGLOBIN A1C
Est. average glucose Bld gHb Est-mCnc: 114 mg/dL
Hgb A1c MFr Bld: 5.6 % (ref 4.8–5.6)

## 2018-12-01 ENCOUNTER — Other Ambulatory Visit: Payer: Self-pay

## 2018-12-01 ENCOUNTER — Emergency Department (HOSPITAL_COMMUNITY): Payer: Self-pay

## 2018-12-01 ENCOUNTER — Emergency Department (HOSPITAL_COMMUNITY)
Admission: EM | Admit: 2018-12-01 | Discharge: 2018-12-01 | Disposition: A | Discharge status: Home / Self Care | Payer: Self-pay | Attending: Emergency Medicine | Admitting: Emergency Medicine

## 2018-12-01 ENCOUNTER — Encounter (HOSPITAL_COMMUNITY): Payer: Self-pay | Admitting: *Deleted

## 2018-12-01 DIAGNOSIS — R1013 Epigastric pain: Secondary | ICD-10-CM

## 2018-12-01 DIAGNOSIS — K2960 Other gastritis without bleeding: Secondary | ICD-10-CM | POA: Insufficient documentation

## 2018-12-01 DIAGNOSIS — Z87891 Personal history of nicotine dependence: Secondary | ICD-10-CM | POA: Insufficient documentation

## 2018-12-01 DIAGNOSIS — R0789 Other chest pain: Secondary | ICD-10-CM | POA: Insufficient documentation

## 2018-12-01 LAB — CBC WITH DIFFERENTIAL/PLATELET
Abs Immature Granulocytes: 0.03 10*3/uL (ref 0.00–0.07)
Basophils Absolute: 0 10*3/uL (ref 0.0–0.1)
Basophils Relative: 0 %
Eosinophils Absolute: 0.2 10*3/uL (ref 0.0–0.5)
Eosinophils Relative: 3 %
HCT: 40.6 % (ref 36.0–46.0)
Hemoglobin: 12.4 g/dL (ref 12.0–15.0)
Immature Granulocytes: 0 %
Lymphocytes Relative: 37 %
Lymphs Abs: 2.8 10*3/uL (ref 0.7–4.0)
MCH: 26.1 pg (ref 26.0–34.0)
MCHC: 30.5 g/dL (ref 30.0–36.0)
MCV: 85.3 fL (ref 80.0–100.0)
Monocytes Absolute: 0.4 10*3/uL (ref 0.1–1.0)
Monocytes Relative: 6 %
Neutro Abs: 4.1 10*3/uL (ref 1.7–7.7)
Neutrophils Relative %: 54 %
Platelets: 255 10*3/uL (ref 150–400)
RBC: 4.76 MIL/uL (ref 3.87–5.11)
RDW: 14.5 % (ref 11.5–15.5)
WBC: 7.6 10*3/uL (ref 4.0–10.5)
nRBC: 0 % (ref 0.0–0.2)

## 2018-12-01 LAB — URINALYSIS, ROUTINE W REFLEX MICROSCOPIC
Bilirubin Urine: NEGATIVE
Glucose, UA: NEGATIVE mg/dL
Ketones, ur: NEGATIVE mg/dL
Nitrite: NEGATIVE
Protein, ur: NEGATIVE mg/dL
Specific Gravity, Urine: 1.02 (ref 1.005–1.030)
WBC, UA: >50 WBC/hpf — ABNORMAL HIGH (ref 0–5)
pH: 6 (ref 5.0–8.0)

## 2018-12-01 LAB — I-STAT BETA HCG BLOOD, ED (MC, WL, AP ONLY): I-stat hCG, quantitative: <5 m[IU]/mL (ref <5)

## 2018-12-01 LAB — COMPREHENSIVE METABOLIC PANEL
ALT: 20 U/L (ref 0–44)
AST: 32 U/L (ref 15–41)
Albumin: 2.9 g/dL — ABNORMAL LOW (ref 3.5–5.0)
Alkaline Phosphatase: 38 U/L (ref 38–126)
Anion gap: 8 (ref 5–15)
BUN: 14 mg/dL (ref 6–20)
CO2: 25 mmol/L (ref 22–32)
Calcium: 8.1 mg/dL — ABNORMAL LOW (ref 8.9–10.3)
Chloride: 101 mmol/L (ref 98–111)
Creatinine, Ser: 0.88 mg/dL (ref 0.44–1.00)
GFR calc Af Amer: >60 mL/min (ref >60)
GFR calc non Af Amer: >60 mL/min (ref >60)
Glucose, Bld: 102 mg/dL — ABNORMAL HIGH (ref 70–99)
Potassium: 4.5 mmol/L (ref 3.5–5.1)
Sodium: 134 mmol/L — ABNORMAL LOW (ref 135–145)
Total Bilirubin: 0.5 mg/dL (ref 0.3–1.2)
Total Protein: 6.3 g/dL — ABNORMAL LOW (ref 6.5–8.1)

## 2018-12-01 LAB — LIPASE, BLOOD: Lipase: 28 U/L (ref 11–51)

## 2018-12-01 MED ORDER — ONDANSETRON HCL 4 MG/2ML IJ SOLN
4.0000 mg | Freq: Once | INTRAMUSCULAR | Status: AC
  Administered 2018-12-01: 4 mg via INTRAVENOUS
  Filled 2018-12-01: qty 2

## 2018-12-01 MED ORDER — SODIUM CHLORIDE 0.9 % IV BOLUS
1000.0000 mL | Freq: Once | INTRAVENOUS | Status: AC
  Administered 2018-12-01: 10:00:00 1000 mL via INTRAVENOUS

## 2018-12-01 MED ORDER — ALUM & MAG HYDROXIDE-SIMETH 200-200-20 MG/5ML PO SUSP
30.0000 mL | Freq: Once | ORAL | Status: AC
  Administered 2018-12-01: 09:00:00 30 mL via ORAL
  Filled 2018-12-01: qty 30

## 2018-12-01 MED ORDER — KETOROLAC TROMETHAMINE 30 MG/ML IJ SOLN
30.0000 mg | Freq: Once | INTRAMUSCULAR | Status: AC
  Administered 2018-12-01: 30 mg via INTRAVENOUS
  Filled 2018-12-01: qty 1

## 2018-12-01 MED ORDER — MORPHINE SULFATE (PF) 4 MG/ML IV SOLN
4.0000 mg | Freq: Once | INTRAVENOUS | Status: AC
  Administered 2018-12-01: 10:00:00 4 mg via INTRAVENOUS
  Filled 2018-12-01: qty 1

## 2018-12-01 MED ORDER — FAMOTIDINE 20 MG PO TABS: 20.0000 mg | ORAL_TABLET | Freq: Two times a day (BID) | ORAL | 0 refills | Status: DC

## 2018-12-01 MED ORDER — LIDOCAINE VISCOUS HCL 2 % MT SOLN
15.0000 mL | Freq: Once | OROMUCOSAL | Status: AC
  Administered 2018-12-01: 15 mL via ORAL
  Filled 2018-12-01: qty 15

## 2018-12-01 NOTE — Discharge Instructions (Signed)
Take the Pepcid as prescribed.  Follow-up with [facility] care provider for reevaluation of your symptoms.  If you develop exertional chest pain, shortness of breath, throwing up blood, sweating please seek reevaluation.

## 2018-12-01 NOTE — ED Notes (Signed)
Patient transported to Ultrasound 

## 2018-12-01 NOTE — ED Provider Notes (Signed)
[facility] EMERGENCY DEPARTMENT Provider Note   CSN: [id number] Arrival date & time: 12/01/18  0636   History   Chief Complaint Epigastric pain  HPI Alyssa Goodman is a 28 y.o. female with past medical history significant for asthma, obesity, trichomonas who presents for evaluation of epigastric pain.  Patient states she has had intermittent epigastric pain x 6 months however worse over the last 24 hours.  She is unsure if this is associated with food intake.  Patient states she will also occasionally get a burning sensation from epigastric region that radiates into her chest.  She has not taken anything for her symptoms.  States occasional burping of acid like material. She states her symptoms today feels like nagging going around her upper abdomen. She is unsure if she has history of gallstones however this is listed in her past medical records.  Denies fever, chills, nausea, vomiting, shortness of breath, diarrhea, dysuria, possible pregnancy.  No additional aggravating or alleviating factors.  Rates her current pain a 7/10. Pain non exertional and non pleuritic.  No prior history of hypertension, hyperlipidemia, family history of early MI, connective tissue disorders.  No melena, hematochezia, hematemesis.  History obtained from patient and past medical records.  No interpreter was used.     HPI  Past Medical History:  Diagnosis Date  . Asthma   . Gallstones   . Obesity   . Trichimoniasis     Patient Active Problem List   Diagnosis Date Noted  . Morbid obesity with BMI of 50.0-59.9, adult (HCC) 05/23/2016  . Morbid obesity 06/28/2012  . Asthma, chronic 06/27/2012    Past Surgical History:  Procedure Laterality Date  . NO PAST SURGERIES       OB History    Gravida  1   Para  1   Term  1   Preterm      AB      Living  1     SAB      TAB      Ectopic      Multiple  0   Live Births  1            Home Medications    Prior to  Admission medications   Medication Sig Start Date End Date Taking? Authorizing Provider  Clindamycin Phosphate, 1 Dose, vaginal cream 1 applicatorful per vagina for 1 dose at bedtime Patient not taking: Reported on 11/13/2018 11/10/17   Constant, Peggy, MD  famotidine (PEPCID) 20 MG tablet Take 1 tablet (20 mg total) by mouth 2 (two) times daily. 12/01/18   Lenor Provencher A, PA-C  meloxicam (MOBIC) 7.5 MG tablet Take 1 tablet (7.5 mg total) by mouth daily. Patient not taking: Reported on 11/08/2017 10/10/17   Belinda Fisher, PA-C  norelgestromin-ethinyl estradiol Burr Medico) 150-35 MCG/24HR transdermal patch Place 1 patch onto the skin once a week. 09/27/18   Constant, Peggy, MD    Family History Family History  Problem Relation Age of Onset  . Hypertension Mother   . Diabetes Maternal Uncle   . Hypertension Maternal Uncle   . Diabetes Maternal Grandmother   . Hypertension Maternal Grandmother   . Heart disease Maternal Grandmother   . Cancer Maternal Grandfather        colon    Social History Social History   Tobacco Use  . Smoking status: Former Smoker    Types: Cigarettes  . Smokeless tobacco: Never Used  . Tobacco comment: stopped with +UPT  Substance Use Topics  . Alcohol use: No  . Drug use: No     Allergies   Metronidazole, Kiwi extract, and Shellfish allergy   Review of Systems Review of Systems  Constitutional: Negative.   HENT: Negative.   Eyes: Negative.   Respiratory: Negative.   Cardiovascular: Positive for chest pain (Burning intermittent, non currently). Negative for palpitations and leg swelling.  Gastrointestinal: Positive for abdominal pain. Negative for abdominal distention, anal bleeding, blood in stool, constipation, diarrhea, nausea, rectal pain and vomiting.       Reflux, epigastric pain  Genitourinary: Negative.   Musculoskeletal: Negative.   Skin: Negative.   Neurological: Negative.   All other systems reviewed and are negative.   Physical Exam  Updated Vital Signs BP (!) 121/59   Pulse 66   Temp 98.7 F (37.1 C) (Oral)   Resp 16   Ht 5\' 1"  (1.549 m)   Wt 135.2 kg   LMP 11/02/2018   SpO2 100%   BMI 56.31 kg/m   Physical Exam Vitals signs and nursing note reviewed.  Constitutional:      General: She is not in acute distress.    Appearance: She is well-developed. She is not ill-appearing, toxic-appearing or diaphoretic.  HENT:     Head: Normocephalic and atraumatic.     Jaw: There is normal jaw occlusion.     Nose: Nose normal.     Mouth/Throat:     Mouth: Mucous membranes are moist.     Pharynx: Oropharynx is clear.  Eyes:     Pupils: Pupils are equal, round, and reactive to light.  Neck:     Musculoskeletal: Full passive range of motion without pain, normal range of motion and neck supple.     Vascular: No carotid bruit or JVD.     Trachea: Trachea and phonation normal.  Cardiovascular:     Rate and Rhythm: Normal rate.     Pulses: Normal pulses.          Radial pulses are 2+ on the right side and 2+ on the left side.       Posterior tibial pulses are 2+ on the right side and 2+ on the left side.     Heart sounds: Normal heart sounds.  Pulmonary:     Effort: Pulmonary effort is normal. No respiratory distress.     Breath sounds: Normal breath sounds and air entry.  Abdominal:     General: Bowel sounds are normal. There is no distension.     Palpations: Abdomen is soft.     Tenderness: There is abdominal tenderness in the epigastric area. There is no right CVA tenderness, left CVA tenderness, guarding or rebound. Negative signs include Murphy's sign.       Comments: Tenderness to epigastric region.  Negative Murphy sign.  No rebound or guarding.  No abdominal wall herniations.  Normoactive bowel sounds  Musculoskeletal: Normal range of motion.     Comments: Moves  all 4 extremities without difficulty.  Homans sign negative.  Bilateral calves without tenderness, redness or warmth.  Compartments soft.  Skin:     General: Skin is warm and dry.     Comments: No edema, erythema, ecchymosis or warmth.  Neurological:     Mental Status: She is alert.     Comments: Cranial nerves II through XII grossly intact.  No facial droop.  Ambulatory that difficulty.    ED Treatments / Results  Labs (all labs ordered are listed, but only abnormal results are  displayed) Labs Reviewed  COMPREHENSIVE METABOLIC PANEL - Abnormal; Notable for the following components:      Result Value   Sodium 134 (*)    Glucose, Bld 102 (*)    Calcium 8.1 (*)    Total Protein 6.3 (*)    Albumin 2.9 (*)    All other components within normal limits  URINALYSIS, ROUTINE W REFLEX MICROSCOPIC - Abnormal; Notable for the following components:   APPearance CLOUDY (*)    Hgb urine dipstick SMALL (*)    Leukocytes,Ua LARGE (*)    WBC, UA >50 (*)    Bacteria, UA RARE (*)    All other components within normal limits  LIPASE, BLOOD  CBC WITH DIFFERENTIAL/PLATELET  CBC WITH DIFFERENTIAL/PLATELET  CBC WITH DIFFERENTIAL/PLATELET  I-STAT BETA HCG BLOOD, ED ([facility], [facility], [facility] ONLY)    EKG EKG Interpretation  Date/Time:  Saturday December 01 2018 07:00:18 EDT Ventricular Rate:  74 PR Interval:    QRS Duration: 90 QT Interval:  417 QTC Calculation: 463 R Axis:   51 Text Interpretation:  Sinus rhythm Low voltage, precordial leads Confirmed by Raeford Razor [phone]) on 12/01/2018 11:30:04 AM   Radiology US Abdomen Limited Ruq  Result Date: 12/01/2018 CLINICAL DATA:  Epigastric abdominal pain. EXAM: ULTRASOUND ABDOMEN LIMITED RIGHT UPPER QUADRANT COMPARISON:  10/13/2012 FINDINGS: Gallbladder: No gallstones or gallbladder wall thickening. No pericholecystic fluid. The sonographer reports no sonographic Murphy's sign. Common bile duct: Diameter: 3 mm Liver: No focal lesion identified. Increased echogenicity of the parenchyma suggests fatty deposition. Portal vein is patent on color Doppler imaging with normal direction of blood flow towards  the liver. Other: None. IMPRESSION: Probable hepatic steatosis.  Otherwise unremarkable. Electronically Signed   By: Kennith Center M.D.   On: 12/01/2018 10:23    Procedures Procedures (including critical care time)  Medications Ordered in ED Medications  sodium chloride 0.9 % bolus 1,000 mL (0 mLs Intravenous Stopped 12/01/18 1115)  morphine 4 MG/ML injection 4 mg (4 mg Intravenous Given 12/01/18 0937)  ondansetron (ZOFRAN) injection 4 mg (4 mg Intravenous Given 12/01/18 0937)  alum & mag hydroxide-simeth (MAALOX/MYLANTA) 200-200-20 MG/5ML suspension 30 mL (30 mLs Oral Given 12/01/18 0925)    And  lidocaine (XYLOCAINE) 2 % viscous mouth solution 15 mL (15 mLs Oral Given 12/01/18 0925)  ketorolac (TORADOL) 30 MG/ML injection 30 mg (30 mg Intravenous Given 12/01/18 1209)   Initial Impression / Assessment and Plan / ED Course  I have reviewed the triage vital signs and the nursing notes.  Pertinent labs & imaging results that were available during my care of the patient were reviewed by me and considered in my medical decision making (see chart for details).  28 year old female appears otherwise well presents for evaluation of the gastric pain.  She is afebrile, nonseptic, non-ill-appearing.  Triage note notes presentation for chest pain however patient states she does have intermittent burning sensation which goes from her epigastric region up into her chest however no actual chest pain. Last episode of chest burning greater than 2 weeks ago.  Pain nonexertional, nonpleuritic in nature. No evidence of DVT on exam.  Low suspicion for ACS, PE, dissection, Borhaave, pericarditis, myocarditis. EKG without ST/T changes. She is tender to epigastric region.  She has a negative Murphy sign.  She does have known history of gallstones.  Pain not related to food intake. Heart and lungs clear.  Will obtain basic labs, provide pain management, ultrasound and reevaluate.  1100: Pain improved with meds. [name] with  hepatic steatosis. No cholelithiasis or cholecystitis.  Previous CBC clotted, nursing to redraw. LFT WNL. Heart and lungs clear. No evidence of pneumonia, pneumothorax.  Discussed possible chest x-ray however patient states she does not want this at this time.  Discussed cannot rule out possible pneumonia, pneumothorax, cardiomegally however patient declined.  Discussed risks versus benefit which she voiced understanding. Given pain more so to epigastric region I feel this is reasonable at this time. Also given length of symptoms occurring over the last 6 months.  Patient disposition delayed secondary to clotting CBC.  Urinalysis does show positive leukocytes and rare bacteria however patient without any urinary symptoms.  Shared decision making patient does not want antibiotics for possible UTI at this time given no symptomatology.  I feel this is reasonable.  Tolerating p.o. intake.  No melena, hematochezia, hematemesis.  CBC without abnormality.  Patient without any current pain with GI cocktail and Toradol.  High suspicion for reflux type symptoms.  PERC negative. Low suspicion for ACS, PE, dissection.  Patient is nontoxic, nonseptic appearing, in no apparent distress.  Patient's pain and other symptoms adequately managed in emergency department.  Fluid bolus given.  Labs, imaging and vitals reviewed.  Patient does not meet the SIRS or Sepsis criteria.  On repeat exam patient does not have a surgical abdomin and there are no peritoneal signs.  No indication of appendicitis, bowel obstruction, bowel perforation, cholecystitis, diverticulitis, PID or ectopic pregnancy.  Patient discharged home with symptomatic treatment and given strict instructions for follow-up with their primary care physician.  I have also discussed reasons to return immediately to the ER.  Patient expresses understanding and agrees with plan.  The patient has been appropriately medically screened and/or stabilized in the ED. I have  low suspicion for any other emergent medical condition which would require further screening, evaluation or treatment in the ED or require inpatient management.      Final Clinical Impressions(s) / ED Diagnoses   Final diagnoses:  Epigastric abdominal pain  Reflux gastritis    ED Discharge Orders         Ordered    famotidine (PEPCID) 20 MG tablet  2 times daily     12/01/18 1434           Jeanne Diefendorf A, PA-C 12/01/18 1446    Virgel Manifold, MD 12/02/18 [phone]

## 2018-12-01 NOTE — ED Triage Notes (Signed)
C/o pain under both breast radiating into both arms off and on for several months.

## 2018-12-04 ENCOUNTER — Other Ambulatory Visit: Payer: Self-pay | Admitting: Obstetrics and Gynecology

## 2018-12-05 ENCOUNTER — Emergency Department (HOSPITAL_COMMUNITY): Payer: Self-pay

## 2018-12-05 ENCOUNTER — Emergency Department (HOSPITAL_COMMUNITY)
Admission: EM | Admit: 2018-12-05 | Discharge: 2018-12-06 | Disposition: A | Discharge status: Home / Self Care | Payer: Self-pay | Attending: Emergency Medicine | Admitting: Emergency Medicine

## 2018-12-05 ENCOUNTER — Encounter (HOSPITAL_COMMUNITY): Payer: Self-pay | Admitting: Emergency Medicine

## 2018-12-05 ENCOUNTER — Other Ambulatory Visit: Payer: Self-pay

## 2018-12-05 DIAGNOSIS — R0602 Shortness of breath: Secondary | ICD-10-CM | POA: Insufficient documentation

## 2018-12-05 DIAGNOSIS — R1013 Epigastric pain: Secondary | ICD-10-CM | POA: Insufficient documentation

## 2018-12-05 DIAGNOSIS — R0789 Other chest pain: Secondary | ICD-10-CM | POA: Insufficient documentation

## 2018-12-05 DIAGNOSIS — Z79899 Other long term (current) drug therapy: Secondary | ICD-10-CM | POA: Insufficient documentation

## 2018-12-05 DIAGNOSIS — J45909 Unspecified asthma, uncomplicated: Secondary | ICD-10-CM | POA: Insufficient documentation

## 2018-12-05 DIAGNOSIS — R519 Headache, unspecified: Secondary | ICD-10-CM | POA: Insufficient documentation

## 2018-12-05 DIAGNOSIS — Z87891 Personal history of nicotine dependence: Secondary | ICD-10-CM | POA: Insufficient documentation

## 2018-12-05 DIAGNOSIS — Z20828 Contact with and (suspected) exposure to other viral communicable diseases: Secondary | ICD-10-CM | POA: Insufficient documentation

## 2018-12-05 LAB — CBC
HCT: 38.2 % (ref 36.0–46.0)
Hemoglobin: 12.2 g/dL (ref 12.0–15.0)
MCH: 27.2 pg (ref 26.0–34.0)
MCHC: 31.9 g/dL (ref 30.0–36.0)
MCV: 85.1 fL (ref 80.0–100.0)
Platelets: 270 10*3/uL (ref 150–400)
RBC: 4.49 MIL/uL (ref 3.87–5.11)
RDW: 14.3 % (ref 11.5–15.5)
WBC: 11.7 10*3/uL — ABNORMAL HIGH (ref 4.0–10.5)
nRBC: 0 % (ref 0.0–0.2)

## 2018-12-05 LAB — PROTIME-INR
INR: 1.1 (ref 0.8–1.2)
Prothrombin Time: 14.3 seconds (ref 11.4–15.2)

## 2018-12-05 LAB — I-STAT BETA HCG BLOOD, ED (MC, WL, AP ONLY): I-stat hCG, quantitative: <5 m[IU]/mL (ref <5)

## 2018-12-05 MED ORDER — SODIUM CHLORIDE 0.9% FLUSH: 3.0000 mL | Freq: Once | INTRAVENOUS | Status: DC

## 2018-12-05 NOTE — ED Triage Notes (Signed)
Patient reports persistent pain across her chest onset this week with mild SOB , denies emesis or diaphoresis , no cough or fever .

## 2018-12-06 ENCOUNTER — Emergency Department (HOSPITAL_COMMUNITY): Payer: Self-pay

## 2018-12-06 LAB — TROPONIN I (HIGH SENSITIVITY)
Troponin I (High Sensitivity): 6 ng/L (ref <18)
Troponin I (High Sensitivity): 4 ng/L (ref <18)

## 2018-12-06 LAB — BASIC METABOLIC PANEL
Anion gap: 7 (ref 5–15)
BUN: 10 mg/dL (ref 6–20)
CO2: 25 mmol/L (ref 22–32)
Calcium: 8.6 mg/dL — ABNORMAL LOW (ref 8.9–10.3)
Chloride: 103 mmol/L (ref 98–111)
Creatinine, Ser: 1 mg/dL (ref 0.44–1.00)
GFR calc Af Amer: >60 mL/min (ref >60)
GFR calc non Af Amer: >60 mL/min (ref >60)
Glucose, Bld: 105 mg/dL — ABNORMAL HIGH (ref 70–99)
Potassium: 3.8 mmol/L (ref 3.5–5.1)
Sodium: 135 mmol/L (ref 135–145)

## 2018-12-06 LAB — D-DIMER, QUANTITATIVE: D-Dimer, Quant: 0.57 ug/mL-FEU — ABNORMAL HIGH (ref 0.00–0.50)

## 2018-12-06 MED ORDER — IOHEXOL 350 MG/ML SOLN
100.0000 mL | Freq: Once | INTRAVENOUS | Status: AC | PRN
  Administered 2018-12-06: 100 mL via INTRAVENOUS

## 2018-12-06 MED ORDER — PROCHLORPERAZINE EDISYLATE 10 MG/2ML IJ SOLN
10.0000 mg | Freq: Once | INTRAMUSCULAR | Status: AC
  Administered 2018-12-06: 09:00:00 10 mg via INTRAVENOUS
  Filled 2018-12-06: qty 2

## 2018-12-06 MED ORDER — DIPHENHYDRAMINE HCL 50 MG/ML IJ SOLN
25.0000 mg | Freq: Once | INTRAMUSCULAR | Status: AC
  Administered 2018-12-06: 09:00:00 25 mg via INTRAVENOUS
  Filled 2018-12-06: qty 1

## 2018-12-06 MED ORDER — KETOROLAC TROMETHAMINE 15 MG/ML IJ SOLN
15.0000 mg | Freq: Once | INTRAMUSCULAR | Status: AC
  Administered 2018-12-06: 09:00:00 15 mg via INTRAVENOUS
  Filled 2018-12-06: qty 1

## 2018-12-06 MED ORDER — ONDANSETRON 4 MG PO TBDP
4.0000 mg | ORAL_TABLET | Freq: Once | ORAL | Status: AC
  Administered 2018-12-06: 4 mg via ORAL
  Filled 2018-12-06: qty 1

## 2018-12-06 NOTE — Discharge Instructions (Addendum)
Your laboratory results were within normal limits.  We have discussed the results of your CT.  If you would like to set up an appointment with cardiology on outpatient basis, their contact information is attached to your chart.  You may alternate ibuprofen or Tylenol to help with your pain.

## 2018-12-06 NOTE — ED Notes (Signed)
Patient verbalizes understanding of discharge instructions. Opportunity for questioning and answers were provided. Armband removed by staff, pt discharged from ED.  

## 2018-12-06 NOTE — ED Provider Notes (Signed)
[facility] EMERGENCY DEPARTMENT Provider Note   CSN: [id number] Arrival date & time: 12/05/18  2255     History   Chief Complaint Chief Complaint  Patient presents with   Chest Pain    HPI Alyssa Goodman is a 28 y.o. female.     28 y.o female with a PMH of Asthma, Gallstones, Obesity presents to the ED with a chief complaint of chest pain and headache x 5 days. Patient reports an intermittent dull pressure in the center of her chest wrapping around her back. She reports the pain has been intermittent. She was seen about 1 week ago, for epigastric pain reports she has been taking pepcid without improvement in symptoms.  Patient also endorses a headache, reports she feels this is like a migraine, she is had these in the past, has taken Excedrin along with ibuprofen for relief without improvement.  She also does not have a prior history of CAD, family hx of CAD, or prior history of blood clots. She denies any fever, cough, or shortness of breath.   The history is provided by the patient and medical records.  Chest Pain Associated symptoms: headache   Associated symptoms: no abdominal pain, no back pain, no dizziness, no fever, no nausea, no shortness of breath, no vomiting and no weakness     Past Medical History:  Diagnosis Date   Asthma    Gallstones    Obesity    Trichimoniasis     Patient Active Problem List   Diagnosis Date Noted   Morbid obesity with BMI of 50.0-59.9, adult (HCC) 05/23/2016   Morbid obesity 06/28/2012   Asthma, chronic 06/27/2012    Past Surgical History:  Procedure Laterality Date   NO PAST SURGERIES       OB History    Gravida  1   Para  1   Term  1   Preterm      AB      Living  1     SAB      TAB      Ectopic      Multiple  0   Live Births  1            Home Medications    Prior to Admission medications   Medication Sig Start Date End Date Taking? Authorizing Provider  Clindamycin  Phosphate, 1 Dose, vaginal cream 1 applicatorful per vagina for 1 dose at bedtime Patient not taking: Reported on 11/13/2018 11/10/17   Constant, Peggy, MD  famotidine (PEPCID) 20 MG tablet Take 1 tablet (20 mg total) by mouth 2 (two) times daily. 12/01/18   Henderly, Britni A, PA-C  meloxicam (MOBIC) 7.5 MG tablet Take 1 tablet (7.5 mg total) by mouth daily. Patient not taking: Reported on 11/08/2017 10/10/17   Belinda Fisher, PA-C  norelgestromin-ethinyl estradiol [facility]) 150-35 MCG/24HR transdermal patch Place 1 patch onto the skin once a week. 09/27/18   Constant, Peggy, MD    Family History Family History  Problem Relation Age of Onset   Hypertension Mother    Diabetes Maternal Uncle    Hypertension Maternal Uncle    Diabetes Maternal Grandmother    Hypertension Maternal Grandmother    Heart disease Maternal Grandmother    Cancer Maternal Grandfather        colon    Social History Social History   Tobacco Use   Smoking status: Former Smoker    Types: Cigarettes   Smokeless tobacco: Never Used  Tobacco comment: stopped with +UPT  Substance Use Topics   Alcohol use: No   Drug use: No     Allergies   Metronidazole, Kiwi extract, and Shellfish allergy   Review of Systems Review of Systems  Constitutional: Negative for fever.  HENT: Negative for sore throat.   Eyes: Negative for photophobia and pain.  Respiratory: Negative for shortness of breath.   Cardiovascular: Positive for chest pain.  Gastrointestinal: Negative for abdominal pain, diarrhea, nausea and vomiting.  Genitourinary: Negative for flank pain.  Musculoskeletal: Negative for back pain.  Neurological: Positive for headaches. Negative for dizziness, seizures, syncope and weakness.     Physical Exam Updated Vital Signs BP 118/61 (BP Location: Right Arm)    Pulse 70    Temp 97.9 F (36.6 C) (Oral)    Resp 16    LMP 11/24/2018    SpO2 100%   Physical Exam Vitals signs and nursing note reviewed.    Constitutional:      Appearance: She is well-developed. She is obese. She is not ill-appearing.     Comments: Sleeping upon entering exam.   Eyes:     Pupils: Pupils are equal, round, and reactive to light.  Neck:     Musculoskeletal: Normal range of motion and neck supple.  Cardiovascular:     Rate and Rhythm: Normal rate.     Heart sounds: No murmur.  Pulmonary:     Effort: Pulmonary effort is normal.     Breath sounds: No decreased breath sounds or wheezing.  Abdominal:     General: Bowel sounds are normal.     Palpations: Abdomen is soft.  Musculoskeletal:     Right lower leg: No edema.     Left lower leg: No edema.     Comments: No BL pitting edema.   Skin:    General: Skin is warm and dry.  Neurological:     Mental Status: She is alert and oriented to person, place, and time.     Comments: Alert, oriented, thought content appropriate. Speech fluent without evidence of aphasia. Able to follow 2 step commands without difficulty.  Cranial Nerves:  II:  Peripheral visual fields grossly normal, pupils, round, reactive to light III,IV, VI: ptosis not present, extra-ocular motions intact bilaterally  V,VII: smile symmetric, facial light touch sensation equal VIII: hearing grossly normal bilaterally  IX,X: midline uvula rise  XI: bilateral shoulder shrug equal and strong XII: midline tongue extension  Motor:  5/5 in upper and lower extremities bilaterally including strong and equal grip strength and dorsiflexion/plantar flexion Sensory: light touch normal in all extremities.  Cerebellar: normal finger-to-nose with bilateral upper extremities, pronator drift negative Gait: normal gait and balance       ED Treatments / Results  Labs (all labs ordered are listed, but only abnormal results are displayed) Labs Reviewed  BASIC METABOLIC PANEL - Abnormal; Notable for the following components:      Result Value   Glucose, Bld 105 (*)    Calcium 8.6 (*)    All other  components within normal limits  CBC - Abnormal; Notable for the following components:   WBC 11.7 (*)    All other components within normal limits  D-DIMER, QUANTITATIVE (NOT AT [facility]) - Abnormal; Notable for the following components:   D-Dimer, Quant 0.57 (*)    All other components within normal limits  NOVEL CORONAVIRUS, NAA (HOSP ORDER, SEND-OUT TO REF LAB; TAT 18-24 HRS)  PROTIME-INR  I-STAT BETA HCG BLOOD, ED ([facility],  [facility], [facility] ONLY)  TROPONIN I (HIGH SENSITIVITY)  TROPONIN I (HIGH SENSITIVITY)    EKG None  Radiology Dg Chest 2 View  Result Date: 12/05/2018 CLINICAL DATA:  28 year old female with chest pain EXAM: CHEST - 2 VIEW COMPARISON:  Chest radiograph dated 10/13/2012 FINDINGS: The heart size and mediastinal contours are within normal limits. Both lungs are clear. The visualized skeletal structures are unremarkable. IMPRESSION: No active cardiopulmonary disease. Electronically Signed   By: Anner Crete M.D.   On: 12/05/2018 23:30   Ct Angio Chest Pe W And/or Wo Contrast  Result Date: 12/06/2018 CLINICAL DATA:  Chest pain. EXAM: CT ANGIOGRAPHY CHEST WITH CONTRAST TECHNIQUE: Multidetector CT imaging of the chest was performed using the standard protocol during bolus administration of intravenous contrast. Multiplanar CT image reconstructions and MIPs were obtained to evaluate the vascular anatomy. CONTRAST:  141mL OMNIPAQUE IOHEXOL 350 MG/ML SOLN COMPARISON:  None. FINDINGS: Cardiovascular: Satisfactory opacification of the pulmonary arteries to the segmental level. No evidence of pulmonary embolism. Normal heart size. No pericardial effusion. Mediastinum/Nodes: No enlarged mediastinal, hilar, or axillary lymph nodes. Thyroid gland, trachea, and esophagus demonstrate no significant findings. Lungs/Pleura: Lungs are clear. No pleural effusion or pneumothorax. Upper Abdomen: No acute abnormality. Musculoskeletal: No chest wall abnormality. No acute or significant osseous findings. Review  of the MIP images confirms the above findings. IMPRESSION: No definite evidence of pulmonary embolus. No definite abnormality seen in the chest. Electronically Signed   By: Marijo Conception M.D.   On: 12/06/2018 10:11    Procedures Procedures (including critical care time)  Medications Ordered in ED Medications  sodium chloride flush (NS) 0.9 % injection 3 mL (has no administration in time range)  ondansetron (ZOFRAN-ODT) disintegrating tablet 4 mg (4 mg Oral Given 12/06/18 0324)  prochlorperazine (COMPAZINE) injection 10 mg (10 mg Intravenous Given 12/06/18 0844)  diphenhydrAMINE (BENADRYL) injection 25 mg (25 mg Intravenous Given 12/06/18 0844)  ketorolac (TORADOL) 15 MG/ML injection 15 mg (15 mg Intravenous Given 12/06/18 0845)  iohexol (OMNIPAQUE) 350 MG/ML injection 100 mL (100 mLs Intravenous Contrast Given 12/06/18 0941)     Initial Impression / Assessment and Plan / ED Course  I have reviewed the triage vital signs and the nursing notes.  Pertinent labs & imaging results that were available during my care of the patient were reviewed by me and considered in my medical decision making (see chart for details).       Patient with no past medical history presents to the ED with complaints of chest pain, headache x5 days.  Patient was previously seen in the ED for epigastric pain, has been taking Pepcid for her chest pain however now she reports this feels like somewhat a pressure on her chest.  She also endorses a headache, does have prior history of migraine however states headache has not been able to be controlled at home.  There is no photophobia, vomiting, dizziness, weakness.  Her neuro exam is within normal limits.  BMP without any acute electrolyte derangement, creatinine level is within normal limits.  CBC with a slight leukocytosis at 11.7.  Beta hCG is negative, troponin x2 are unremarkable. Low suspicion for ACS, She does not have a prior history of hypertension, CAD, family  history of CAD or blood clots.  She currently is currently on birth control, this consist of a patch, no OCPs.  Patient also reports concern for COVID-19 infection, states she was recently swapped at the beginning of the month, according to her records which have  extensively review patient was swabbed at the beginning of September with a negative result.  Will provide patient with pain medication, reassess.  Patient with otherwise stable vital signs, no tachycardia, no hypoxia, hypertension.  A d-dimer was obtained as patient reported some swelling to her legs although there is no calf tenderness on my exam.  D-dimer was positive.  Will obtain CT angios to rule out any pulmonary embolisms or other acute process.Pain is not positional of change with lying or standing, no changes on EKG consistent with pericarditis.   CT Angio: No definite evidence of pulmonary embolus. No definite abnormality  seen in the chest.     These results were discussed with patient at length.  She reports improvement in headache after headache cocktail.  No dizziness, is able to ambulate inside the room with a steady gait.  Vitals are within normal limits.  She is currently working at [employer], reports she would like to take today off and go back to work tomorrow.  Patient has been educated that she will likely need to follow-up with cardiology if she has a persistent pain in her chest, will attach outpatient resources.  Patient understands and agrees with management, return precautions discussed at length.  Patient stable for discharge.   Portions of this note were generated with Scientist, clinical (histocompatibility and immunogenetics). Dictation errors may occur despite best attempts at proofreading.  Final Clinical Impressions(s) / ED Diagnoses   Final diagnoses:  Atypical chest pain    ED Discharge Orders    None       Claude Manges, PA-C 12/06/18 1028    Geoffery Lyons, MD 12/06/18 1504

## 2018-12-07 LAB — NOVEL CORONAVIRUS, NAA (HOSP ORDER, SEND-OUT TO REF LAB; TAT 18-24 HRS): SARS-CoV-2, NAA: NOT DETECTED

## 2019-05-14 ENCOUNTER — Telehealth: Payer: Self-pay

## 2019-05-14 MED ORDER — XULANE 150-35 MCG/24HR TD PTWK: 1.0000 | MEDICATED_PATCH | TRANSDERMAL | 1 refills | Status: DC

## 2019-05-14 NOTE — Telephone Encounter (Signed)
Returned call, no answer, left vm 

## 2019-07-18 ENCOUNTER — Other Ambulatory Visit: Payer: Self-pay

## 2019-07-18 MED ORDER — XULANE 150-35 MCG/24HR TD PTWK: 1.0000 | MEDICATED_PATCH | TRANSDERMAL | 1 refills | Status: DC

## 2019-07-18 NOTE — Progress Notes (Signed)
Pt called requesting rf on Xulane.

## 2019-10-07 ENCOUNTER — Other Ambulatory Visit: Payer: Self-pay

## 2019-10-07 MED ORDER — XULANE 150-35 MCG/24HR TD PTWK: 1.0000 | MEDICATED_PATCH | TRANSDERMAL | 1 refills | Status: DC

## 2019-10-07 NOTE — Progress Notes (Signed)
Rx sent per protocol for pt until AEX .

## 2019-11-18 ENCOUNTER — Other Ambulatory Visit (HOSPITAL_COMMUNITY)
Admission: RE | Admit: 2019-11-18 | Discharge: 2019-11-18 | Disposition: A | Discharge status: Home / Self Care | Payer: BC Managed Care – PPO | Source: Ambulatory Visit | Attending: Nurse Practitioner | Admitting: Nurse Practitioner

## 2019-11-18 ENCOUNTER — Other Ambulatory Visit: Payer: Self-pay

## 2019-11-18 ENCOUNTER — Encounter: Payer: Self-pay | Admitting: Nurse Practitioner

## 2019-11-18 ENCOUNTER — Ambulatory Visit (INDEPENDENT_AMBULATORY_CARE_PROVIDER_SITE_OTHER): Payer: BC Managed Care – PPO | Admitting: Nurse Practitioner

## 2019-11-18 VITALS — BP 115/74 | HR 72 | Ht 61.0 in | Wt 305.0 lb

## 2019-11-18 DIAGNOSIS — Z01419 Encounter for gynecological examination (general) (routine) without abnormal findings: Secondary | ICD-10-CM | POA: Insufficient documentation

## 2019-11-18 DIAGNOSIS — Z3045 Encounter for surveillance of transdermal patch hormonal contraceptive device: Secondary | ICD-10-CM | POA: Diagnosis not present

## 2019-11-18 MED ORDER — XULANE 150-35 MCG/24HR TD PTWK: 1.0000 | MEDICATED_PATCH | TRANSDERMAL | 12 refills | Status: DC

## 2019-11-18 NOTE — Progress Notes (Signed)
Pt is in the office for annual. Last pap 11-08-17 Currently on Xulane patch for [facility] LMP 11-07-19, pt reports that she has not been sexually active in about 4 years GAD-7= 3

## 2019-11-18 NOTE — Progress Notes (Signed)
GYNECOLOGY ANNUAL PREVENTATIVE CARE ENCOUNTER NOTE  Subjective:   Alyssa Goodman is a 29 y.o. G42P1001 female here for a routine annual gynecologic exam.  Current complaints: wants patch that is brand name as the generic does not stay adhered to her body without additional tape.   Denies abnormal vaginal bleeding, discharge, pelvic pain, problems with intercourse or other gynecologic concerns.    Gynecologic History Patient's last menstrual period was 11/07/2019. Contraception: Ortho-Evra patches weekly Last Pap: 11-08-17. Results were: normal  Obstetric History OB History  Gravida Para Term Preterm AB Living  1 1 1     1   SAB TAB Ectopic Multiple Live Births        0 1    # Outcome Date GA Lbr Len/2nd Weight Sex Delivery Anes PTL Lv  1 Term 09/28/16 [redacted]w[redacted]d 03:37 / 06:02 7 lb 10.1 oz (3.46 kg) F Vag-Spont EPI  LIV    Past Medical History:  Diagnosis Date  . Asthma   . Gallstones   . Obesity   . Trichimoniasis     Past Surgical History:  Procedure Laterality Date  . NO PAST SURGERIES      Current Outpatient Medications on File Prior to Visit  Medication Sig Dispense Refill  . Clindamycin Phosphate, 1 Dose, vaginal cream 1 applicatorful per vagina for 1 dose at bedtime (Patient not taking: Reported on 11/13/2018) 5.8 g 0  . famotidine (PEPCID) 20 MG tablet Take 1 tablet (20 mg total) by mouth 2 (two) times daily. (Patient not taking: Reported on 11/18/2019) 30 tablet 0  . meloxicam (MOBIC) 7.5 MG tablet Take 1 tablet (7.5 mg total) by mouth daily. (Patient not taking: Reported on 11/08/2017) 15 tablet 0   No current facility-administered medications on file prior to visit.    Allergies  Allergen Reactions  . Metronidazole Shortness Of Breath and Rash  . Kiwi Extract Itching  . Shellfish Allergy     Mostly oysters and limited to number of shrimp.    Social History   Socioeconomic History  . Marital status: Single    Spouse name: Not on file  . Number of children: Not  on file  . Years of education: 75  . Highest education level: Not on file  Occupational History  . Occupation: UNEMPLOYED    Employer: UNENPLOYED  Tobacco Use  . Smoking status: Former Smoker    Types: Cigarettes  . Smokeless tobacco: Never Used  . Tobacco comment: stopped with +UPT  Substance and Sexual Activity  . Alcohol use: No  . Drug use: No  . Sexual activity: Not Currently    Birth control/protection: Patch  Other Topics Concern  . Not on file  Social History Narrative  . Not on file   Social Determinants of Health   Financial Resource Strain:   . Difficulty of Paying Living Expenses: Not on file  Food Insecurity:   . Worried About 18 in the Last Year: Not on file  . Ran Out of Food in the Last Year: Not on file  Transportation Needs:   . Lack of Transportation (Medical): Not on file  . Lack of Transportation (Non-Medical): Not on file  Physical Activity:   . Days of Exercise per Week: Not on file  . Minutes of Exercise per Session: Not on file  Stress:   . Feeling of Stress : Not on file  Social Connections:   . Frequency of Communication with Friends and Family: Not on file  .  Frequency of Social Gatherings with Friends and Family: Not on file  . Attends Religious Services: Not on file  . Active Member of Clubs or Organizations: Not on file  . Attends Banker Meetings: Not on file  . Marital Status: Not on file  Intimate Partner Violence:   . Fear of Current or Ex-Partner: Not on file  . Emotionally Abused: Not on file  . Physically Abused: Not on file  . Sexually Abused: Not on file    Family History  Problem Relation Age of Onset  . Hypertension Mother   . Diabetes Maternal Uncle   . Hypertension Maternal Uncle   . Diabetes Maternal Grandmother   . Hypertension Maternal Grandmother   . Heart disease Maternal Grandmother   . Cancer Maternal Grandfather        colon    The following portions of the patient's history  were reviewed and updated as appropriate: allergies, current medications, past family history, past medical history, past social history, past surgical history and problem list.  Review of Systems Pertinent items noted in HPI and remainder of comprehensive ROS otherwise negative.   Objective:  BP 115/74   Pulse 72   Ht 5\' 1"  (1.549 m)   Wt (!) 305 lb (138.3 kg)   LMP 11/07/2019   BMI 57.63 kg/m  CONSTITUTIONAL: Well-developed, well-nourished female in no acute distress.  HENT:  Normocephalic, atraumatic, External right and left ear normal.  EYES: Conjunctivae and EOM are normal. Pupils are equal, round.  No scleral icterus.  NECK: Normal range of motion, supple, no masses.  Normal thyroid.  SKIN: Skin is warm and dry. No rash noted. Not diaphoretic. No erythema. No pallor. NEUROLOGIC: Alert and oriented to person, place, and time. Normal reflexes, muscle tone coordination. No cranial nerve deficit noted. PSYCHIATRIC: Normal mood and affect. Normal behavior. Normal judgment and thought content. CARDIOVASCULAR: Normal heart rate noted, regular rhythm RESPIRATORY: Clear to auscultation bilaterally. Effort and breath sounds normal, no problems with respiration noted. BREASTS: Symmetric in size. No masses, skin changes, nipple drainage, or lymphadenopathy. ABDOMEN: Soft, no distention noted.  No tenderness, rebound or guarding.  PELVIC: Normal appearing external genitalia; normal appearing vaginal mucosa and cervix.  No abnormal discharge noted.  Pap smear obtained.  Evaluation of uterine size limited due to habitus, no other palpable masses, no uterine or adnexal tenderness. MUSCULOSKELETAL: Normal range of motion. No tenderness.  No cyanosis, clubbing, or edema.    Assessment and Plan:  1. Encounter for annual routine gynecological examination Nonsmoker  - Cytology - PAP( [place])  2. Morbid obesity Reviewed the need to reduce BMI for her best health over her lifetime.  Reviewed  that high BMI is associated with increased risk of hypertension and diabetes.  Advised weight loss and suggested client join 01/07/2020 if she is able as it is a good program for accountability and solid nutrition information.  Will follow up results of pap smear and manage accordingly. Mammogram scheduled Routine preventative health maintenance measures emphasized. Please refer to After Visit Summary for other counseling recommendations.    [employer], RN, MSN, NP-BC Nurse Practitioner, [facility] for [facility]

## 2019-11-19 LAB — CYTOLOGY - PAP
Comment: NEGATIVE
Diagnosis: NEGATIVE
High risk HPV: NEGATIVE

## 2019-12-16 ENCOUNTER — Other Ambulatory Visit: Payer: BC Managed Care – PPO

## 2020-06-26 DIAGNOSIS — M7989 Other specified soft tissue disorders: Secondary | ICD-10-CM | POA: Diagnosis not present

## 2020-06-26 DIAGNOSIS — Z131 Encounter for screening for diabetes mellitus: Secondary | ICD-10-CM | POA: Diagnosis not present

## 2020-06-26 DIAGNOSIS — D649 Anemia, unspecified: Secondary | ICD-10-CM | POA: Diagnosis not present

## 2020-06-26 DIAGNOSIS — E559 Vitamin D deficiency, unspecified: Secondary | ICD-10-CM | POA: Diagnosis not present

## 2020-06-26 DIAGNOSIS — M62838 Other muscle spasm: Secondary | ICD-10-CM | POA: Diagnosis not present

## 2020-07-01 ENCOUNTER — Encounter: Payer: Self-pay | Admitting: Neurology

## 2020-07-24 DIAGNOSIS — M62838 Other muscle spasm: Secondary | ICD-10-CM | POA: Diagnosis not present

## 2020-07-24 DIAGNOSIS — E559 Vitamin D deficiency, unspecified: Secondary | ICD-10-CM | POA: Diagnosis not present

## 2020-07-24 DIAGNOSIS — M7989 Other specified soft tissue disorders: Secondary | ICD-10-CM | POA: Diagnosis not present

## 2020-07-24 DIAGNOSIS — G43009 Migraine without aura, not intractable, without status migrainosus: Secondary | ICD-10-CM | POA: Diagnosis not present

## 2020-07-28 ENCOUNTER — Other Ambulatory Visit: Payer: Self-pay | Admitting: Physician Assistant

## 2020-07-28 DIAGNOSIS — R29898 Other symptoms and signs involving the musculoskeletal system: Secondary | ICD-10-CM

## 2020-08-23 ENCOUNTER — Ambulatory Visit
Admission: RE | Admit: 2020-08-23 | Discharge: 2020-08-23 | Disposition: A | Discharge status: Home / Self Care | Payer: BC Managed Care – PPO | Source: Ambulatory Visit | Attending: Physician Assistant | Admitting: Physician Assistant

## 2020-08-23 ENCOUNTER — Other Ambulatory Visit: Payer: Self-pay

## 2020-08-23 DIAGNOSIS — R29898 Other symptoms and signs involving the musculoskeletal system: Secondary | ICD-10-CM

## 2020-08-23 DIAGNOSIS — R531 Weakness: Secondary | ICD-10-CM | POA: Diagnosis not present

## 2020-08-23 DIAGNOSIS — G319 Degenerative disease of nervous system, unspecified: Secondary | ICD-10-CM | POA: Diagnosis not present

## 2020-08-23 MED ORDER — GADOBENATE DIMEGLUMINE 529 MG/ML IV SOLN
20.0000 mL | Freq: Once | INTRAVENOUS | Status: AC | PRN
  Administered 2020-08-23: 20 mL via INTRAVENOUS

## 2020-08-25 ENCOUNTER — Other Ambulatory Visit: Payer: Self-pay | Admitting: Physician Assistant

## 2020-08-25 DIAGNOSIS — R29898 Other symptoms and signs involving the musculoskeletal system: Secondary | ICD-10-CM

## 2020-08-31 ENCOUNTER — Other Ambulatory Visit: Payer: Self-pay

## 2020-08-31 ENCOUNTER — Ambulatory Visit
Admission: RE | Admit: 2020-08-31 | Discharge: 2020-08-31 | Disposition: A | Discharge status: Home / Self Care | Payer: BC Managed Care – PPO | Source: Ambulatory Visit | Attending: Physician Assistant | Admitting: Physician Assistant

## 2020-08-31 DIAGNOSIS — R29898 Other symptoms and signs involving the musculoskeletal system: Secondary | ICD-10-CM

## 2020-08-31 DIAGNOSIS — R259 Unspecified abnormal involuntary movements: Secondary | ICD-10-CM | POA: Diagnosis not present

## 2020-08-31 MED ORDER — GADOBENATE DIMEGLUMINE 529 MG/ML IV SOLN
20.0000 mL | Freq: Once | INTRAVENOUS | Status: AC | PRN
  Administered 2020-08-31: 20 mL via INTRAVENOUS

## 2020-09-09 NOTE — Progress Notes (Signed)
[facility] Clinic Note - Initial Visit   Date: 09/10/20  RAJANAE Goodman MRN: [id number] DOB: 10-26-1990   Dear Dr. Kenton Kingfisher:  Thank you for your kind referral of Alyssa Goodman for consultation of muscle spasms. Although her history is well known to you, please allow [name] to reiterate it for the purpose of our medical record. The patient was accompanied to the clinic by self.    History of Present Illness: Alyssa Goodman is a 30 y.o. right-handed female with morbid obesity presenting for evaluation of abnormal hand movements.  The referral was sent for muscle spasms, but upon further questioning, she describes abnormal hand movements. Starting around the age of 11-12, she began having involuntary movements of the hands, arms, and neck. She has a video recording where her fingers are moving as if she is typing or playing the piano.  The movements are graceful and smooth.  She occasionally also gets muscle contraction at the elbow and drawing up of the arm.  It generally lasts about < 10 minutes but can recur throughout the day.  It occurs daily and some days are worse.  If she tries to forcefully suppress the movement, she has achy pain in the body.  Stress can sometimes make it worse. No changes with caffeine or alcohol.  She tried muscle relaxer which did not help. She tends to hold her hands when sitting to control the movement. Over the past several years, it has been getting worse because it occurs more frequently. She does not recall any specific trigger.  She did have strep throat in middle school but does not recall if symptoms started following this.   No family history of movement disorder.  She has never been on antipsychotic medication. She denies limb tremors, numbness/tingling, or limb weakness.  No falls.  MRI brain and cervical spine was performed last month and unremarkable.   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 She lives with parents.   Out-side paper records,  electronic medical record, and images have been reviewed where available and summarized as:  Labs 06/28/2020:  HbA1c 5.6, vitamin B12 331, vitamin D 10.8*, CMP normal  MRI brain wwo contrast 08/24/2020: No evidence of acute intracranial abnormality. Mild generalized cerebral and cerebellar atrophy, advanced for age. Otherwise unremarkable MRI appearance of the brain.   MRI cervical spine wwo contrast 08/31/2020:   Normal MRI of the cervical spine.   Lab Results  Component Value Date   HGBA1C 5.6 11/13/2018   No results found for: VITAMINB12 Lab Results  Component Value Date   TSH 2.330 11/13/2018   No results found for: ESRSEDRATE, POCTSEDRATE  Past Medical History:  Diagnosis Date   Asthma    Gallstones    Obesity    Trichimoniasis     Past Surgical History:  Procedure Laterality Date   NO PAST SURGERIES       Medications:  Outpatient Encounter Medications as of 09/10/2020  Medication Sig   cyclobenzaprine (FLEXERIL) 10 MG tablet Take 10 mg by mouth at bedtime as needed.   norelgestromin-ethinyl estradiol [name]) 150-35 MCG/24HR transdermal patch Place 1 patch onto the skin once a week.   Clindamycin Phosphate, 1 Dose, vaginal cream 1 applicatorful per vagina for 1 dose at bedtime (Patient not taking: No sig reported)   famotidine (PEPCID) 20 MG tablet Take 1 tablet (20 mg total) by mouth 2 (two) times daily. (Patient not taking: No sig reported)   meloxicam (MOBIC) 7.5 MG tablet Take  1 tablet (7.5 mg total) by mouth daily. (Patient not taking: No sig reported)   No facility-administered encounter medications on file as of 09/10/2020.    Allergies:  Allergies  Allergen Reactions   Metronidazole Shortness Of Breath and Rash   Kiwi Extract Itching   Shellfish Allergy     Mostly oysters and limited to number of shrimp.    Family History: Family History  Problem Relation Age of Onset   Hypertension Mother    Diabetes Maternal Uncle    Hypertension Maternal Uncle     Diabetes Maternal Grandmother    Hypertension Maternal Grandmother    Heart disease Maternal Grandmother    Cancer Maternal Grandfather        colon    Social History: Social History   Tobacco Use   Smoking status: Former    Pack years: 0.00    Types: Cigarettes   Smokeless tobacco: Never   Tobacco comments:    stopped with +UPT  Substance Use Topics   Alcohol use: No   Drug use: No   Social History   Social History Narrative   Right Handed   Lives in a two story home    Drinks Caffeine     Vital Signs:  BP (!) 129/93   Pulse 74   Ht 5\' 1"  (1.549 m)   Wt (!) 318 lb (144.2 kg)   SpO2 98%   BMI 60.09 kg/m   Neurological Exam: MENTAL STATUS including orientation to time, place, person, recent and remote memory, attention span and concentration, language, and fund of knowledge is normal.  Speech is not dysarthric.  CRANIAL NERVES: II:  No visual field defects.  Unremarkable fundi.   III-IV-VI: Pupils equal round and reactive to light.  Normal conjugate, extra-ocular eye movements in all directions of gaze.  No nystagmus.  No ptosis.   V:  Normal facial sensation.    VII:  Normal facial symmetry and movements.   VIII:  Normal hearing and vestibular function.   IX-X:  Normal palatal movement.   XI:  Normal shoulder shrug and head rotation.   XII:  Normal tongue strength and range of motion, no deviation or fasciculation.  MOTOR:  No atrophy, fasciculations or abnormal movements.  No pronator drift. I do not observe any abnormal movements on her exam.  Upper Extremity:  Right  Left  Deltoid  5/5   5/5   Biceps  5/5   5/5   Triceps  5/5   5/5   Infraspinatus 5/5  5/5  Medial pectoralis 5/5  5/5  Wrist extensors  5/5   5/5   Wrist flexors  5/5   5/5   Finger extensors  5/5   5/5   Finger flexors  5/5   5/5   Dorsal interossei  5/5   5/5   Abductor pollicis  5/5   5/5   Tone (Ashworth scale)  0  0   Lower Extremity:  Right  Left  Hip flexors  5/5   5/5    Hip extensors  5/5   5/5   Adductor 5/5  5/5  Abductor 5/5  5/5  Knee flexors  5/5   5/5   Knee extensors  5/5   5/5   Dorsiflexors  5/5   5/5   Plantarflexors  5/5   5/5   Toe extensors  5/5   5/5   Toe flexors  5/5   5/5   Tone (Ashworth scale)  0  0  MSRs:  Right        Left                  brachioradialis 2+  2+  biceps 2+  2+  triceps 2+  2+  patellar 2+  2+  ankle jerk 2+  2+  Hoffman no  no  plantar response down  down   SENSORY:  Normal and symmetric perception of light touch, pinprick, vibration, and proprioception.    COORDINATION/GAIT: Normal finger-to- nose-finger and heel-to-shin.  Intact rapid alternating movements bilaterally.    Gait narrow based and stable. Tandem and stressed gait intact.    IMPRESSION: Abnormal movements of the fingers with occasional involvement of the upper arm and neck, longstanding since childhood.  Movements are intermittent throughout the day, occurring 1-2 times and lasting < 10 minutes. She has a video recording of these movements involving the hands which appears more like choreiform movements.  She has no family history of similar movements.   I will check a few labs and request the opinion of Dr. Carles Collet, our Movement Disorder specialist.  Check ESR, ANA, copper, ceruloplasmin, PTH, ferritin, gliadin antibodies.  I have asked her to continue to record these movements to share with Dr. Carles Collet, since she did not have any during the visit today.    Thank you for allowing me to participate in patient's care.  If I can answer any additional questions, I would be pleased to do so.    Sincerely,    Neliah Cuyler K. Posey Pronto, DO

## 2020-09-10 ENCOUNTER — Other Ambulatory Visit (INDEPENDENT_AMBULATORY_CARE_PROVIDER_SITE_OTHER): Payer: BC Managed Care – PPO

## 2020-09-10 ENCOUNTER — Other Ambulatory Visit: Payer: Self-pay

## 2020-09-10 ENCOUNTER — Ambulatory Visit: Payer: BC Managed Care – PPO | Admitting: Neurology

## 2020-09-10 ENCOUNTER — Encounter: Payer: Self-pay | Admitting: Neurology

## 2020-09-10 VITALS — BP 129/93 | HR 74 | Ht 61.0 in | Wt 318.0 lb

## 2020-09-10 DIAGNOSIS — G255 Other chorea: Secondary | ICD-10-CM

## 2020-09-10 DIAGNOSIS — R259 Unspecified abnormal involuntary movements: Secondary | ICD-10-CM

## 2020-09-10 DIAGNOSIS — R258 Other abnormal involuntary movements: Secondary | ICD-10-CM | POA: Diagnosis not present

## 2020-09-10 LAB — SEDIMENTATION RATE: Sed Rate: 31 mm/h — ABNORMAL HIGH (ref 0–20)

## 2020-09-10 LAB — FERRITIN: Ferritin: 25 ng/mL (ref 10.0–291.0)

## 2020-09-10 NOTE — Patient Instructions (Signed)
Check labs  I will refer you to our Movement Disorder specialist, Dr. Arbutus Leas.

## 2020-09-13 LAB — CERULOPLASMIN: Ceruloplasmin: 45 mg/dL (ref 18–53)

## 2020-09-13 LAB — GLIADIN ANTIBODIES, SERUM
Gliadin IgA: 2.4 U/mL
Gliadin IgG: <1 U/mL

## 2020-09-13 LAB — COPPER, SERUM: Copper: 223 ug/dL — ABNORMAL HIGH (ref 70–175)

## 2020-09-13 LAB — ANA: Anti Nuclear Antibody (ANA): NEGATIVE

## 2020-09-13 LAB — PARATHYROID HORMONE, INTACT (NO CA): PTH: 49 pg/mL (ref 16–77)

## 2020-09-14 ENCOUNTER — Ambulatory Visit: Payer: BC Managed Care – PPO | Admitting: Neurology

## 2020-09-25 NOTE — Progress Notes (Signed)
Patient advised of labs.

## 2020-10-09 NOTE — Progress Notes (Signed)
Assessment/Plan:   1.  Possible paroxysmal Kinesiogenic dyskinesia (PKD)  -Patient's history is suspicious for paroxysmal Kinesiogenic dyskinesia.  This generally responds very well to anticonvulsants, particularly carbamazepine.  However, I told the patient that she can also have paroxysmal non-Kinesiogenic dyskinesia as well, which does not respond well to medications.  -EEG should be obtained.  Would like to do ambulatory first but insurance will require routine first.  -discussed that can be inherited (not all and she has no fam hx).  Discussed genetic testing for PRRT2 gene (aut dom and therefore implications for future generations) - test code [facility].  Patient is interested in that given that she does have a child.  -We discussed carbamazepine in detail.  Discussed that this would decrease effectiveness of the birth control.  She reports that she is not worried about that (currently not sexually active).  Nonetheless, I told her that this is something that we are concerned about in her age group and she should use a backup form of birth control, such as condoms.  I am not going to prescribe this until we are through with her EEG.  She agrees. Subjective:   Alyssa Goodman was seen today in neurologic consultation at the request of Dr. Allena Katz for abnormal movements, choreiform in nature.  Outside records that were made available to me were reviewed.  I have also discussed the case in person with Dr. Allena Katz.  Patient reports that symptoms started around the age of 30 or 30 years old.  Symptoms seem to be involuntary movements of the hands, arms, and even the neck.  When it first started, movements were not as frequent, perhaps 1-2 times per week.   now they are daily, multiple times per day.  It can last seconds and the longest it may last would be 2-3 min.  Patient can try to suppress the movement and is able to do that for a short time, but it will be painful to suppress the movement (it feels  stiff).  She reports no known family history of similar and no family history of movement disorders.  She reports that stress can sometimes trigger an event or make it worse.  She doesn't think that being startled/scared would make it come .  She doesn't think that hyperventilation might trigger an attack. She does have a prodrome prior to the event,  tightness.  Caffeine and/or ETOH don't change frequency of events.  Patient has had MRI of the brain August 23, 2020 and MRI of the cervical spine on August 31, 2020.  These were essentially unremarkable.  I personally reviewed her MRI of the brain.    ALLERGIES:   Allergies  Allergen Reactions   Metronidazole Shortness Of Breath and Rash   Kiwi Extract Itching   Shellfish Allergy     Mostly oysters and limited to number of shrimp.    CURRENT MEDICATIONS:  Outpatient Encounter Medications as of 10/13/2020  Medication Sig   aspirin-acetaminophen-caffeine (EXCEDRIN MIGRAINE) 250-250-65 MG tablet Take by mouth every 6 (six) hours as needed for headache.   norelgestromin-ethinyl estradiol Burr Medico) 150-35 MCG/24HR transdermal patch Place 1 patch onto the skin once a week.   [DISCONTINUED] Clindamycin Phosphate, 1 Dose, vaginal cream 1 applicatorful per vagina for 1 dose at bedtime (Patient not taking: No sig reported)   [DISCONTINUED] cyclobenzaprine (FLEXERIL) 10 MG tablet Take 10 mg by mouth at bedtime as needed. (Patient not taking: Reported on 10/13/2020)   [DISCONTINUED] famotidine (PEPCID) 20 MG tablet Take  1 tablet (20 mg total) by mouth 2 (two) times daily. (Patient not taking: No sig reported)   [DISCONTINUED] meloxicam (MOBIC) 7.5 MG tablet Take 1 tablet (7.5 mg total) by mouth daily. (Patient not taking: No sig reported)   No facility-administered encounter medications on file as of 10/13/2020.    Objective:   PHYSICAL EXAMINATION:    VITALS:   Vitals:   10/13/20 0840  BP: (!) 150/82  Pulse: 75  SpO2: 98%  Weight: (!) 319 lb (144.7 kg)   Height: 5\' 1"  (1.549 m)    GEN:  Normal appears female in no acute distress.  Appears stated age. HEENT:  Normocephalic, atraumatic. The mucous membranes are moist. The superficial temporal arteries are without ropiness or tenderness. Cardiovascular: Regular rate and rhythm. Lungs: Clear to auscultation bilaterally. Neck/Heme: There are no carotid bruits noted bilaterally.  NEUROLOGICAL: Orientation:  The patient is alert and oriented x 3.   Cranial nerves: There is good facial symmetry.  Extraocular muscles are intact and visual fields are full to confrontational testing. Speech is fluent and clear. Soft palate rises symmetrically and there is no tongue deviation. Hearing is intact to conversational tone. Tone: Tone is good throughout. Sensation: Sensation is intact to light touch and pinprick throughout (facial, trunk, extremities). Vibration is intact at the bilateral big toe. There is no extinction with double simultaneous stimulation. There is no sensory dermatomal level identified. Coordination:  The patient has no difficulty with RAM's or FNF bilaterally. Motor: Strength is 5/5 in the bilateral upper and lower extremities.  Shoulder shrug is equal and symmetric. There is no pronator drift.  There are no fasciculations noted. DTR's: Deep tendon reflexes are 1/4 at the bilateral biceps, triceps, brachioradialis, patella and achilles.  Plantar responses are downgoing bilaterally. Gait and Station: The patient is able to ambulate without difficulty. The patient is able to heel toe walk without any difficulty. The patient is able to ambulate in a tandem fashion. The patient is able to stand in the Romberg position. Abnormal movements: None today.  She brings in a video in which the fingers of the right hand are moving in a piano playing motion.  She also brings a second video (which is less clear) that she describes to me is when the arm is feeling tight and pulling.    Total time spent on  today's visit was , including both face-to-face time and nonface-to-face time.  Time included that spent on review of records (prior notes available to me/labs/imaging if pertinent), discussing treatment and goals, answering patient's questions and coordinating care.   Cc:  , PA

## 2020-10-13 ENCOUNTER — Encounter: Payer: Self-pay | Admitting: Neurology

## 2020-10-13 ENCOUNTER — Ambulatory Visit (INDEPENDENT_AMBULATORY_CARE_PROVIDER_SITE_OTHER): Payer: BC Managed Care – PPO | Admitting: Neurology

## 2020-10-13 ENCOUNTER — Other Ambulatory Visit: Payer: Self-pay

## 2020-10-13 VITALS — BP 150/82 | HR 75 | Ht 61.0 in | Wt 319.0 lb

## 2020-10-13 DIAGNOSIS — Z0000 Encounter for general adult medical examination without abnormal findings: Secondary | ICD-10-CM | POA: Diagnosis not present

## 2020-10-13 DIAGNOSIS — R259 Unspecified abnormal involuntary movements: Secondary | ICD-10-CM | POA: Diagnosis not present

## 2020-10-13 DIAGNOSIS — Z13220 Encounter for screening for lipoid disorders: Secondary | ICD-10-CM | POA: Diagnosis not present

## 2020-10-13 DIAGNOSIS — G241 Genetic torsion dystonia: Secondary | ICD-10-CM

## 2020-10-13 DIAGNOSIS — E559 Vitamin D deficiency, unspecified: Secondary | ICD-10-CM | POA: Diagnosis not present

## 2020-10-13 NOTE — Patient Instructions (Addendum)
Your presumptive diagnosis is paroxysmal kinesiogenic dyskinesia, but it could also be paroxysmal nonkinesiogenic dyskinesia  We will do the genetic testing.  Not all patients have an abnormal gene.  Let me know when they contact you and if you have it drawn  We will order an EEG to make sure that this isn't seizure (doubtful)  As mentioned, low dose carbamazepine (tegretol) can resolve this but I want to hold on prescribing this until after the EEG is done.

## 2020-10-14 NOTE — Addendum Note (Signed)
Addended by: Karl Luke A on: 10/14/2020 03:32 PM   Modules accepted: Orders

## 2020-10-29 ENCOUNTER — Other Ambulatory Visit: Payer: Self-pay

## 2020-10-29 ENCOUNTER — Ambulatory Visit (INDEPENDENT_AMBULATORY_CARE_PROVIDER_SITE_OTHER): Payer: BC Managed Care – PPO | Admitting: Neurology

## 2020-10-29 DIAGNOSIS — R251 Tremor, unspecified: Secondary | ICD-10-CM

## 2020-10-29 DIAGNOSIS — G241 Genetic torsion dystonia: Secondary | ICD-10-CM

## 2020-10-29 DIAGNOSIS — R259 Unspecified abnormal involuntary movements: Secondary | ICD-10-CM

## 2020-10-30 NOTE — Procedures (Signed)
TECHNICAL SUMMARY:  A multichannel referential and bipolar montage EEG using the standard international 10-20 system was performed on the patient described as awake, drowsy and asleep.  The dominant background activity consists of 9 to 10 hertz activity seen most prominantly over the posterior head region.  The backgound activity is reactive to eye opening and closing procedures.  Low voltage fast (beta) activity is distributed symmetrically and maximally over the anterior head regions.  ACTIVATION:  Stepwise photic stimulation at 4-20 flashes per second was performed and did not elicit any abnormal waveforms.  Hyperventilation was not performed.  EPILEPTIFORM ACTIVITY:  There were no spikes, sharp waves or paroxysmal activity.  SLEEP: Stage I and stage II sleep architecture are identified.   IMPRESSION:  This is a normal EEG for the patients stated age.  There were no focal, hemispheric or lateralizing features.  No epileptiform activity was recorded.  A normal EEG does not exclude the diagnosis of a seizure disorder and if seizure remains high on the list of differential diagnosis, an ambulatory EEG may be of value.  Clinical correlation is required.  

## 2020-11-25 ENCOUNTER — Telehealth: Payer: Self-pay | Admitting: Neurology

## 2020-11-25 NOTE — Telephone Encounter (Signed)
Pt called, said she got the genetic testing that tat ordered for her,. The lady that was supposed to come draw her blood didn't work out. She needs to know what to do. She needs a call

## 2020-11-26 NOTE — Telephone Encounter (Signed)
Called patient back and instructed her to contact [facility] again and schedule a time to complete the testing as this is the only lab in the country doing this testing

## 2020-12-01 ENCOUNTER — Telehealth: Payer: Self-pay | Admitting: Neurology

## 2020-12-01 ENCOUNTER — Ambulatory Visit (INDEPENDENT_AMBULATORY_CARE_PROVIDER_SITE_OTHER): Payer: BC Managed Care – PPO | Admitting: Neurology

## 2020-12-01 ENCOUNTER — Other Ambulatory Visit: Payer: Self-pay

## 2020-12-01 DIAGNOSIS — R259 Unspecified abnormal involuntary movements: Secondary | ICD-10-CM | POA: Diagnosis not present

## 2020-12-01 NOTE — Telephone Encounter (Signed)
Patient came into office for EEG and also wanted to give Dr. Arbutus Leas an update on there referral for genetic testing: she is still in the process of getting the testing coordinated. FYI only, per patient.

## 2020-12-09 ENCOUNTER — Telehealth: Payer: Self-pay | Admitting: Neurology

## 2020-12-09 DIAGNOSIS — G241 Genetic torsion dystonia: Secondary | ICD-10-CM | POA: Diagnosis not present

## 2020-12-09 NOTE — Telephone Encounter (Signed)
Pt called, she would like to cancel the genetic testing kit, and just bring it to back to [name]. She said she feels like she isn't getting anywhere. She cant sch a time with the lady and then The lady said she wasn't on her schedule. She is asking if you can call her back ASAP to let Caffie Pinto know what she is going to do. [phone]

## 2020-12-09 NOTE — Telephone Encounter (Signed)
Note Tailor called back in, said the lady just called her and said she will be there today to do the testing. So disregard the last message sent

## 2020-12-14 ENCOUNTER — Telehealth: Payer: Self-pay | Admitting: Neurology

## 2020-12-14 NOTE — Telephone Encounter (Signed)
Reviewed EEG with Dr. Karel Jarvis.  Electrographically it was normal.  Looked at pt events on video.  Sometimes with choreiform movements, sometimes with lip smacking and tic like movements of head and even tongue thrusting.  While these could be primary tics, the arm movements really look more choreiform.  Still could be PKD or NPKD but waiting on her blood work to be done.  In the meantime, we could trial that medication she and I discussed so long as she strictly uses condoms/back up birth control.

## 2020-12-14 NOTE — Procedures (Signed)
ELECTROENCEPHALOGRAM REPORT  Dates of Recording: 12/01/2020 9:19AM to 12/03/2020 7:35AM  Patient's Name: Alyssa Goodman MRN: [id number] Date of Birth: Jun 17, 1990  Referring Provider: Dr. Lurena Joiner Tat  Procedure: 46-hour ambulatory video EEG  History: This is a 30 year old woman with abnormal choreiform movements.  Medications:  EXCEDRIN MIGRAINE 250-250-65 MG tablet XULANE 150-35 MCG/24HR transdermal patch  Technical Summary: This is a 46-hour multichannel digital video EEG recording measured by the international 10-20 system with electrodes applied with paste and impedances below 5000 ohms performed as portable with EKG monitoring.  The digital EEG was referentially recorded, reformatted, and digitally filtered in a variety of bipolar and referential montages for optimal display.    DESCRIPTION OF RECORDING: During maximal wakefulness, the background activity consisted of a symmetric 9 Hz posterior dominant rhythm which was reactive to eye opening.  There were no epileptiform discharges or focal slowing seen in wakefulness.  During the recording, the patient progresses through wakefulness, drowsiness, and Stage 2 sleep.  Again, there were no epileptiform discharges seen.  Events: On 9/27 at 0926 hours, she "felt sensation." She is lying on the stretcher having EEG electrodes applied, moving eyebrows up and down, no other movements seen. Electrographically, there were no EEG or EKG changes seen.  On 9/27 at 0950 hours, she "felt sensation." Patient not on video. Electrographically, there were no EEG or EKG changes seen.  On 9/27 at 1013 hours, she "felt sensation." Patient not on video. Electrographically, there were no EEG or EKG changes seen.  On 9/27 at 1120 hours, she notes "sensation." Patient not on video. Electrographically, there were no EEG or EKG changes seen.  On 9/27 at 1550 hours, she notes "sensation." Patient not on video. Electrographically, there were no EEG or EKG  changes seen.  On 9/27 at 1621 hours, she notes "sensation." She is sitting on a chair and starts making chewing movements with mouth, moving her head around. She then starts having extension movements in fingers of right hand, slightly on left and moves head. She is able to push button and note symptoms. Electrographically, there were no EEG or EKG changes seen.  On 9/27 at 1646 hours, she notes "sensation." She is sitting back on recliner and has mouth movements, pursing lips, moving head and eyebrows up and down. Electrographically, there were no EEG or EKG changes seen.  On 9/28 at 1232 hours, she notes "sensation." She is not on video. Electrographically, there were no EEG or EKG changes seen.  On 9/28 at 1655 hours, she notes "sensation." She has mouth movements, licking her lips, moves brows up and down. Electrographically, there were no EEG or EKG changes seen.  On 9/28 at 1839 hours, she notes "sensation." Patient not on video. Electrographically, there were no EEG or EKG changes seen.   There were no electrographic seizures seen.  EKG lead was unremarkable.  IMPRESSION: This 46-hour ambulatory video EEG study is normal.    CLINICAL CORRELATION: A normal EEG does not exclude a clinical diagnosis of epilepsy. There were 10 push button episodes for "sensation," four with associated video as described above with no EEG correlate seen, indicating these are non-epileptic. Clinical correlation is advised.   Patrcia Dolly, M.D.

## 2020-12-15 NOTE — Telephone Encounter (Signed)
Called patient talked to her about test results as well as trial medication. Patient had [facility] draw blood for testing on Last Wednesday. Patient would like to wait to get results. Biggest concern for her is she works 10 hour shifts and needs medication that will not cause her and drowsiness. Will call patient back with options once we receive test results

## 2020-12-30 ENCOUNTER — Telehealth: Payer: Self-pay | Admitting: Neurology

## 2020-12-30 NOTE — Telephone Encounter (Signed)
Called patient  and left VM she is currently at work.  Asked her to reach out to [facility] if she had a contact there that we have not heard anything from them yet

## 2020-12-30 NOTE — Telephone Encounter (Signed)
Pt called in wanting to get her EEG results and lab results

## 2020-12-31 ENCOUNTER — Telehealth: Payer: Self-pay | Admitting: Neurology

## 2020-12-31 NOTE — Telephone Encounter (Signed)
Let pt know that her [facility] testing was negative.  We can still try the medication if she would like and see how she does.  Let me know if she wants to try it.

## 2021-01-01 NOTE — Telephone Encounter (Signed)
Left message will reach out Monday again.

## 2021-01-01 NOTE — Telephone Encounter (Signed)
I clarified to patient of negative results, patient is still having symptoms, nerve twitches, doesn't really want to take any meds.Until she has a diagnosis, she works 10 hours shifts. Please advise,

## 2021-01-01 NOTE — Telephone Encounter (Signed)
Tried to call patient back voicemail, already spoke with patient earlier. So not clear on this.

## 2021-01-01 NOTE — Telephone Encounter (Signed)
Patient called to check in about her genetic testing results.   She said it has been almost a month since someone came and drew blood at her home for this.  Patient expressed that she is feeling anxious since it has been so long.  She was told by Jake Samples that all results will need to come from Dr. Arbutus Leas.

## 2021-01-01 NOTE — Telephone Encounter (Signed)
Patient wants to hold on medication at this time. Ask me to let you know thanks.

## 2021-01-04 NOTE — Telephone Encounter (Signed)
Patient would like to go to [facility] for another opinion.

## 2021-01-04 NOTE — Telephone Encounter (Signed)
Left message for patient to call office to speak with chelsea or Kyleigha Markert. 01/04/2021 at 9:06am

## 2021-01-04 NOTE — Telephone Encounter (Signed)
Paperwork has been faxed to [facility]

## 2021-02-05 ENCOUNTER — Telehealth: Payer: Self-pay | Admitting: Neurology

## 2021-02-05 NOTE — Telephone Encounter (Signed)
Patient called and said she was referred for additional testing on 01/03/21 but she has yet to hear anything about it.  She said her symptoms are worsening.

## 2021-02-10 NOTE — Telephone Encounter (Signed)
Called [facility] to follow up they have received her referral and reached out to me on 11-3 for more information that was sent in. Spoke to [place] and they told me they should be calling her in a couple of days to schedule. Called patient left voicemail with [facility] phone number and the information I had about the referral

## 2021-02-24 ENCOUNTER — Telehealth: Payer: Self-pay | Admitting: Neurology

## 2021-02-24 NOTE — Telephone Encounter (Signed)
[facility] and spoke to [place] in Scheduling and referral. Let her know of the progression of this referral going back to 10-31 the original date referral was sent to 11-3 when [facility] has asked for additional information which I faxed to 12-2 when I called and was told patient could be scheduled in a couple of days. Called and spoke to [place] who said she could schedule her at any time. Called [employer] and let her know all this information and who to speak to. Patient very appreciative of all the help and is calling now to  get scheduled

## 2021-02-24 NOTE — Telephone Encounter (Signed)
Patient stated she's been calling [facility] and they keep telling her they have not received the referral. Got off phone today 21st and they told her no referral. She has called the number chelsea left on VM with no luck.

## 2021-05-04 DIAGNOSIS — Z6841 Body Mass Index (BMI) 40.0 and over, adult: Secondary | ICD-10-CM | POA: Diagnosis not present

## 2021-05-04 DIAGNOSIS — Z713 Dietary counseling and surveillance: Secondary | ICD-10-CM | POA: Diagnosis not present

## 2021-05-04 DIAGNOSIS — R259 Unspecified abnormal involuntary movements: Secondary | ICD-10-CM | POA: Insufficient documentation

## 2022-05-13 DIAGNOSIS — Z0000 Encounter for general adult medical examination without abnormal findings: Secondary | ICD-10-CM | POA: Diagnosis not present

## 2022-05-13 DIAGNOSIS — Z23 Encounter for immunization: Secondary | ICD-10-CM | POA: Diagnosis not present

## 2022-05-13 DIAGNOSIS — J4520 Mild intermittent asthma, uncomplicated: Secondary | ICD-10-CM | POA: Diagnosis not present

## 2022-05-13 DIAGNOSIS — G43009 Migraine without aura, not intractable, without status migrainosus: Secondary | ICD-10-CM | POA: Diagnosis not present

## 2022-07-15 ENCOUNTER — Ambulatory Visit: Payer: BC Managed Care – PPO | Admitting: Podiatry

## 2022-07-15 ENCOUNTER — Ambulatory Visit (INDEPENDENT_AMBULATORY_CARE_PROVIDER_SITE_OTHER): Payer: BC Managed Care – PPO

## 2022-07-15 ENCOUNTER — Encounter: Payer: Self-pay | Admitting: Podiatry

## 2022-07-15 DIAGNOSIS — M79672 Pain in left foot: Secondary | ICD-10-CM

## 2022-07-15 DIAGNOSIS — M722 Plantar fascial fibromatosis: Secondary | ICD-10-CM | POA: Diagnosis not present

## 2022-07-15 MED ORDER — DICLOFENAC SODIUM 75 MG PO TBEC: 75.0000 mg | DELAYED_RELEASE_TABLET | Freq: Two times a day (BID) | ORAL | 2 refills | Status: AC

## 2022-07-15 MED ORDER — TRIAMCINOLONE ACETONIDE 10 MG/ML IJ SUSP
10.0000 mg | Freq: Once | INTRAMUSCULAR | Status: AC
  Administered 2022-07-15: 10 mg

## 2022-07-15 NOTE — Patient Instructions (Signed)

## 2022-07-17 NOTE — Progress Notes (Signed)
Subjective:   Patient ID: Alyssa Goodman, female   DOB: 32 y.o.   MRN: [id number]   HPI Patient presents with a lot of pain in the left foot and ankle.  States it is worse in the bottom of the heel and also somewhat into the ankle where she is walking differently.  Patient does not smoke tries to be active does have obesity is complicating factor   Review of Systems  All other systems reviewed and are negative.       Objective:  Physical Exam Vitals and nursing note reviewed.  Constitutional:      Appearance: She is well-developed.  Pulmonary:     Effort: Pulmonary effort is normal.  Musculoskeletal:        General: Normal range of motion.  Skin:    General: Skin is warm.  Neurological:     Mental Status: She is alert.     Neurovascular status intact muscle strength adequate range of motion within normal limits with exquisite discomfort in the plantar left heel at the insertional point of the tendon into the calcaneus with fluid buildup around the medial band moderate flattening of the arch noted     Assessment:  Acute Planter fasciitis left with structural arch reduction as part of pathology obesity is part of pathology     Plan:  H&P x-ray reviewed went ahead today sterile prep injected the plantar fascia left 3 mg Kenalog 5 mg Xylocaine at insertion and applied fascial brace fitted into the arch to lift the arch and try to take pressure off the heel.  Discussed possible orthotic therapy in future and reappoint in 2 weeks and also placed on oral anti-inflammatory agent  X-rays indicate spur formation depression of the arch no indication stress fracture arthritis

## 2022-07-18 ENCOUNTER — Other Ambulatory Visit: Payer: Self-pay | Admitting: Podiatry

## 2022-07-18 DIAGNOSIS — M79672 Pain in left foot: Secondary | ICD-10-CM

## 2022-07-18 DIAGNOSIS — M722 Plantar fascial fibromatosis: Secondary | ICD-10-CM

## 2022-07-29 ENCOUNTER — Ambulatory Visit: Payer: BC Managed Care – PPO | Admitting: Podiatry

## 2022-07-29 ENCOUNTER — Encounter: Payer: Self-pay | Admitting: Podiatry

## 2022-07-29 DIAGNOSIS — M722 Plantar fascial fibromatosis: Secondary | ICD-10-CM

## 2022-07-29 MED ORDER — TRIAMCINOLONE ACETONIDE 10 MG/ML IJ SUSP
10.0000 mg | Freq: Once | INTRAMUSCULAR | Status: AC
  Administered 2022-07-29: 10 mg

## 2022-07-29 NOTE — Progress Notes (Signed)
Subjective:   Patient ID: Alyssa Goodman, female   DOB: 32 y.o.   MRN: [id number]   HPI Patient states still having a lot of severe pain plantar aspect left heel states that it is now more in the center and outside band that is very sore with obesity probably playing a role in this and the fact she has to stand all day   ROS      Objective:  Physical Exam  Neurovascular status intact acute inflammation plantar central and lateral fascia at the insertion calcaneus     Assessment:  Acute plantar fasciitis left inflammation pain noted     Plan:  Reviewed condition sterile prep injected from a lateral direction 3 mg Kenalog 5 mg Xylocaine applied air fracture walker to completely immobilize the lower leg properly fitted to her lower leg with all instructions given

## 2022-08-18 ENCOUNTER — Other Ambulatory Visit: Payer: Self-pay

## 2022-08-18 ENCOUNTER — Encounter (HOSPITAL_COMMUNITY): Payer: Self-pay | Admitting: Emergency Medicine

## 2022-08-18 ENCOUNTER — Ambulatory Visit (HOSPITAL_COMMUNITY)
Admission: EM | Admit: 2022-08-18 | Discharge: 2022-08-18 | Disposition: A | Discharge status: Home / Self Care | Payer: BC Managed Care – PPO | Attending: Internal Medicine | Admitting: Internal Medicine

## 2022-08-18 DIAGNOSIS — R519 Headache, unspecified: Secondary | ICD-10-CM | POA: Insufficient documentation

## 2022-08-18 DIAGNOSIS — R300 Dysuria: Secondary | ICD-10-CM

## 2022-08-18 DIAGNOSIS — Z3202 Encounter for pregnancy test, result negative: Secondary | ICD-10-CM

## 2022-08-18 DIAGNOSIS — Z1152 Encounter for screening for COVID-19: Secondary | ICD-10-CM | POA: Insufficient documentation

## 2022-08-18 DIAGNOSIS — R350 Frequency of micturition: Secondary | ICD-10-CM | POA: Insufficient documentation

## 2022-08-18 LAB — POCT URINALYSIS DIP (MANUAL ENTRY)
Bilirubin, UA: NEGATIVE
Blood, UA: NEGATIVE
Glucose, UA: NEGATIVE mg/dL
Ketones, POC UA: NEGATIVE mg/dL
Leukocytes, UA: NEGATIVE
Nitrite, UA: NEGATIVE
Protein Ur, POC: NEGATIVE mg/dL
Spec Grav, UA: 1.025 (ref 1.010–1.025)
Urobilinogen, UA: 0.2 E.U./dL
pH, UA: 5.5 (ref 5.0–8.0)

## 2022-08-18 LAB — POCT URINE PREGNANCY: Preg Test, Ur: NEGATIVE

## 2022-08-18 MED ORDER — ONDANSETRON 4 MG PO TBDP
4.0000 mg | ORAL_TABLET | Freq: Once | ORAL | Status: AC
  Administered 2022-08-18: 4 mg via ORAL

## 2022-08-18 MED ORDER — KETOROLAC TROMETHAMINE 30 MG/ML IJ SOLN
INTRAMUSCULAR | Status: AC
  Filled 2022-08-18: qty 1

## 2022-08-18 MED ORDER — KETOROLAC TROMETHAMINE 30 MG/ML IJ SOLN
30.0000 mg | Freq: Once | INTRAMUSCULAR | Status: AC
  Administered 2022-08-18: 30 mg via INTRAMUSCULAR

## 2022-08-18 MED ORDER — ONDANSETRON 4 MG PO TBDP
ORAL_TABLET | ORAL | Status: AC
  Filled 2022-08-18: qty 1

## 2022-08-18 MED ORDER — ACETAMINOPHEN 325 MG PO TABS
975.0000 mg | ORAL_TABLET | Freq: Once | ORAL | Status: AC
  Administered 2022-08-18: 975 mg via ORAL

## 2022-08-18 MED ORDER — ACETAMINOPHEN 325 MG PO TABS
ORAL_TABLET | ORAL | Status: AC
  Filled 2022-08-18: qty 3

## 2022-08-18 NOTE — ED Provider Notes (Signed)
[facility]    CSN: [id number] Arrival date & time: 08/18/22  1240      History   Chief Complaint Chief Complaint  Patient presents with   Headache   Dysuria    HPI NONA GRACEY is a 32 y.o. female.   Patient presents to urgent care for evaluation of headache that started last night. Headache is localized to the left frontal aspect of the head and does not cross midline. Denies vision changes, dizziness, or lightheadedness. No photophobia or phonophobia. She does report nausea without vomiting that is constant. Headache is an 8/10 and described as stabbing pain. Took excedrin this morning 7 hours ago without much relief. History of migraines, states she hasn't had a migraine in approximately 1 year. Usually excedrin helps with headaches but did not help this time causing concern. No recent head trauma/injuries. No neck pain, paresthesias to the upper/lower extremities, or weakness to the upper/lower extremities. No rash. No recent fever/chills. No cough, congestion, or sore throat/other viral URI symptoms. No recent sick contacts. Reports urinary frequency without urgency or dysuria. No vaginal symptoms.    Headache Dysuria   Past Medical History:  Diagnosis Date   Asthma    Gallstones    Obesity    Trichimoniasis     Patient Active Problem List   Diagnosis Date Noted   Abnormal involuntary movements 05/04/2021   Morbid obesity with BMI of 50.0-59.9, adult (HCC) 05/23/2016   Morbid obesity 06/28/2012   Asthma, chronic 06/27/2012    Past Surgical History:  Procedure Laterality Date   NO PAST SURGERIES      OB History     Gravida  1   Para  1   Term  1   Preterm      AB      Living  1      SAB      IAB      Ectopic      Multiple  0   Live Births  1            Home Medications    Prior to Admission medications   Medication Sig Start Date End Date Taking? Authorizing Provider  aspirin-acetaminophen-caffeine (EXCEDRIN  MIGRAINE) [phone] MG tablet Take by mouth every 6 (six) hours as needed for headache.    [provider]  Cholecalciferol (VITAMIN D3) 1.25 MG (50000 UT) CAPS Take 1 capsule by mouth once a week. 05/18/22   [provider]  diclofenac (VOLTAREN) 75 MG EC tablet Take 1 tablet (75 mg total) by mouth 2 (two) times daily. 07/15/22   Lenn Sink, DPM    Family History Family History  Problem Relation Age of Onset   Hypertension Mother    Diabetes Maternal Uncle    Hypertension Maternal Uncle    Diabetes Maternal Grandmother    Hypertension Maternal Grandmother    Heart disease Maternal Grandmother    Cancer Maternal Grandfather        colon    Social History Social History   Tobacco Use   Smoking status: Former    Types: Cigarettes   Smokeless tobacco: Never   Tobacco comments:    stopped with +UPT  Vaping Use   Vaping Use: Never used  Substance Use Topics   Alcohol use: No   Drug use: No     Allergies   Metronidazole, Kiwi extract, and Shellfish allergy   Review of Systems Review of Systems  Genitourinary:  Positive for dysuria.  Neurological:  Positive for headaches.  Per HPI   Physical Exam Triage Vital Signs ED Triage Vitals  Enc Vitals Group     BP 08/18/22 1409 122/72     Pulse Rate 08/18/22 1409 89     Resp 08/18/22 1409 18     Temp 08/18/22 1433 98.3 F (36.8 C)     Temp Source 08/18/22 1433 Oral     SpO2 08/18/22 1409 98 %     Weight --      Height --      Head Circumference --      Peak Flow --      Pain Score 08/18/22 1408 8     Pain Loc --      Pain Edu? --      Excl. in GC? --    No data found.  Updated Vital Signs BP 122/72 (BP Location: Left Wrist)   Pulse 89   Temp 98.3 F (36.8 C) (Oral)   Resp 18   LMP 08/09/2022   SpO2 98%   Visual Acuity Right Eye Distance:   Left Eye Distance:   Bilateral Distance:    Right Eye Near:   Left Eye Near:    Bilateral Near:     Physical Exam Vitals and nursing  note reviewed.  Constitutional:      Appearance: She is not ill-appearing or toxic-appearing.  HENT:     Head: Normocephalic and atraumatic.     Right Ear: Hearing and external ear normal.     Left Ear: Hearing and external ear normal.     Nose: Nose normal.     Mouth/Throat:     Lips: Pink.     Mouth: Mucous membranes are moist. No injury.     Tongue: No lesions. Tongue does not deviate from midline.     Palate: No mass and lesions.     Pharynx: Oropharynx is clear. Uvula midline. No pharyngeal swelling, oropharyngeal exudate, posterior oropharyngeal erythema or uvula swelling.     Tonsils: No tonsillar exudate or tonsillar abscesses.  Eyes:     General: Lids are normal. Vision grossly intact. Gaze aligned appropriately.     Extraocular Movements: Extraocular movements intact.     Conjunctiva/sclera: Conjunctivae normal.  Cardiovascular:     Rate and Rhythm: Normal rate and regular rhythm.     Heart sounds: Normal heart sounds, S1 normal and S2 normal.  Pulmonary:     Effort: Pulmonary effort is normal. No respiratory distress.     Breath sounds: Normal breath sounds and air entry.  Musculoskeletal:     Cervical back: Neck supple.  Skin:    General: Skin is warm and dry.     Capillary Refill: Capillary refill takes less than 2 seconds.     Findings: No rash.  Neurological:     General: No focal deficit present.     Mental Status: She is alert and oriented to person, place, and time. Mental status is at baseline.     Cranial Nerves: Cranial nerves 2-12 are intact. No dysarthria or facial asymmetry.     Sensory: Sensation is intact.     Motor: Motor function is intact.     Coordination: Coordination is intact.     Gait: Gait is intact.     Comments: Strength and sensation intact to bilateral upper and lower extremities (5/5). Moves all 4 extremities with normal coordination voluntarily. Non-focal neuro exam.   Psychiatric:        Mood and Affect:  Mood normal.        Speech:  Speech normal.        Behavior: Behavior normal.        Thought Content: Thought content normal.        Judgment: Judgment normal.      UC Treatments / Results  Labs (all labs ordered are listed, but only abnormal results are displayed) Labs Reviewed  POCT URINE PREGNANCY - Normal  SARS CORONAVIRUS 2 (TAT 6-24 HRS)  POCT URINALYSIS DIP (MANUAL ENTRY)    EKG   Radiology No results found.  Procedures Procedures (including critical care time)  Medications Ordered in UC Medications  ondansetron (ZOFRAN-ODT) disintegrating tablet 4 mg (4 mg Oral Given 08/18/22 1516)  ketorolac (TORADOL) 30 MG/ML injection 30 mg (30 mg Intramuscular Given 08/18/22 1516)  acetaminophen (TYLENOL) tablet 975 mg (975 mg Oral Given 08/18/22 1515)    Initial Impression / Assessment and Plan / UC Course  I have reviewed the triage vital signs and the nursing notes.  Pertinent labs & imaging results that were available during my care of the patient were reviewed by me and considered in my medical decision making (see chart for details).   1. Bad headache, encounter for screening for COVID-19 Presentation consistent with tension type headache. Patient concerned for possible COVID-19 and requesting COVID test. COVID testing pending as patient does have a history of asthma. Will call if positive. Otherwise, headache with intermittent nausea treated with ketorolac 30mg  IM, tylenol, and zofran in clinic. No NSAIDs for 24 hours, may resume NSAIDs tomorrow. May use tylenol/ibuprofen as needed for further head pain relief. Encouraged to drink plenty of fluids to stay well hydrated and follow-up with PCP as needed. Strict ED return precautions discussed.   2. Urine pregnancy test negative, urinary frequency Urine pregnancy is negative.  Urinalysis is unremarkable for signs of urinary tract infection.  Patient encouraged to increase water intake to stay well hydrated and avoid frequent intake of urinary irritants.     Discussed physical exam and available lab work findings in clinic with patient.  Counseled patient regarding appropriate use of medications and potential side effects for all medications recommended or prescribed today. Discussed red flag signs and symptoms of worsening condition,when to call the PCP office, return to urgent care, and when to seek higher level of care in the emergency department. Patient verbalizes understanding and agreement with plan. All questions answered. Patient discharged in stable condition.   Final Clinical Impressions(s) / UC Diagnoses   Final diagnoses:  Bad headache  Urinary frequency  Urine pregnancy test negative  Encounter for screening for COVID-19     Discharge Instructions      You have been evaluated today for headache.  You were given medicines for your headache in the clinic today which included a strong NSAID, so do not  take ibuprofen or other NSAIDS (Aleve, aspirin, naproxen, ibuprofen, goody powder, etc.) for the next 12 hours.  Starting tomorrow, take 600mg  ibuprofen every 6 hours or tylenol 1,000 every 6 hours as needed for pain.  Avoid areas of loud noise/harsh light and remember to drink plenty of water to stay well hydrated.  Please follow up with your primary care provider for further management of your headaches.  COVID test is pending. Will call you if positive.   Please seek emergency medical care if you experience worsening or uncontrolled pain, vision changes, recurrent vomiting, difficulty with normal activities, abnormal behavior, difficulty walking, numbness, weakness, or any other concerning symptoms.  I hope you feel better!      ED Prescriptions   None    PDMP not reviewed this encounter.   Carlisle Beers, [place] 08/20/22 2245

## 2022-08-18 NOTE — ED Triage Notes (Signed)
Patient presents to [facility] for evaluation of increased urinary frequency since last night with chills.  Today has headache and back pain.  Would like UTI testing and COVID testing

## 2022-08-18 NOTE — Discharge Instructions (Signed)
You have been evaluated today for headache.  You were given medicines for your headache in the clinic today which included a strong NSAID, so do not  take ibuprofen or other NSAIDS (Aleve, aspirin, naproxen, ibuprofen, goody powder, etc.) for the next 12 hours.  Starting tomorrow, take 600mg  ibuprofen every 6 hours or tylenol 1,000 every 6 hours as needed for pain.  Avoid areas of loud noise/harsh light and remember to drink plenty of water to stay well hydrated.  Please follow up with your primary care provider for further management of your headaches.  COVID test is pending. Will call you if positive.   Please seek emergency medical care if you experience worsening or uncontrolled pain, vision changes, recurrent vomiting, difficulty with normal activities, abnormal behavior, difficulty walking, numbness, weakness, or any other concerning symptoms.   I hope you feel better!

## 2022-08-19 LAB — SARS CORONAVIRUS 2 (TAT 6-24 HRS): SARS Coronavirus 2: NEGATIVE

## 2022-08-24 DIAGNOSIS — Z113 Encounter for screening for infections with a predominantly sexual mode of transmission: Secondary | ICD-10-CM | POA: Diagnosis not present

## 2022-08-24 DIAGNOSIS — Z124 Encounter for screening for malignant neoplasm of cervix: Secondary | ICD-10-CM | POA: Diagnosis not present

## 2022-08-24 DIAGNOSIS — Z01419 Encounter for gynecological examination (general) (routine) without abnormal findings: Secondary | ICD-10-CM | POA: Diagnosis not present

## 2022-08-26 ENCOUNTER — Ambulatory Visit (INDEPENDENT_AMBULATORY_CARE_PROVIDER_SITE_OTHER): Payer: BC Managed Care – PPO | Admitting: Podiatry

## 2022-08-26 ENCOUNTER — Encounter: Payer: Self-pay | Admitting: Podiatry

## 2022-08-26 DIAGNOSIS — M722 Plantar fascial fibromatosis: Secondary | ICD-10-CM

## 2022-08-27 NOTE — Progress Notes (Signed)
Subjective:   Patient ID: Alyssa Goodman, female   DOB: 32 y.o.   MRN: [id number]   HPI Patient states that the bottom of her foot is feeling better but still somewhat sore and that her right ankle hurts with certainly obesity is complicating factor along with flatfeet   ROS      Objective:  Physical Exam  Neurovascular status intact discomfort still noted plantar heel left but improved with the patient's right ankle mildly tender.  Significant flatfoot deformity noted bilateral      Assessment:  Chronic structural issues with obesity and heel pain chronic in nature left with mild arch and ankle pain right     Plan:  Reviewed condition and recommended long-term orthotics to support the arch and take pressure off her feet along with weight loss which I encouraged for her.  She is casted for functional orthotics today and will be seen back when ready

## 2022-09-02 DIAGNOSIS — G43909 Migraine, unspecified, not intractable, without status migrainosus: Secondary | ICD-10-CM | POA: Diagnosis not present

## 2022-10-14 ENCOUNTER — Telehealth: Payer: Self-pay | Admitting: Podiatry

## 2022-10-14 NOTE — Telephone Encounter (Signed)
Lmom for patient to call back to schedule picking up orthotics     Balance is  [phone]

## 2022-10-18 ENCOUNTER — Ambulatory Visit: Payer: BC Managed Care – PPO

## 2022-10-18 NOTE — Progress Notes (Signed)
Patient presents today to pick up custom molded foot orthotics, diagnosed with Plantar Fasciitis by Dr. Charlsie Merles.   Orthotics were dispensed and fit was satisfactory. Reviewed instructions for break-in and wear. Written instructions given to patient.  Patient will follow up as needed.   Alyssa Goodman CPed, CFo, CFm

## 2022-10-25 ENCOUNTER — Telehealth: Payer: Self-pay | Admitting: Podiatry

## 2022-10-25 NOTE — Telephone Encounter (Signed)
Pt called and is needing a note for her job stating with her foot condition(Plantar Fasciitis) she would need to sit down periodically during her 10hr shift.

## 2022-10-26 ENCOUNTER — Encounter: Payer: Self-pay | Admitting: Podiatry

## 2022-10-26 NOTE — Telephone Encounter (Signed)
Notified pt that the letter was written and I have emailed it to the pt. Her email is [email or web address]

## 2022-10-26 NOTE — Telephone Encounter (Signed)
That's fine to give her

## 2023-03-09 DIAGNOSIS — N761 Subacute and chronic vaginitis: Secondary | ICD-10-CM | POA: Diagnosis not present

## 2023-05-19 DIAGNOSIS — G43009 Migraine without aura, not intractable, without status migrainosus: Secondary | ICD-10-CM | POA: Diagnosis not present

## 2023-05-19 DIAGNOSIS — Z0000 Encounter for general adult medical examination without abnormal findings: Secondary | ICD-10-CM | POA: Diagnosis not present

## 2023-05-19 DIAGNOSIS — E559 Vitamin D deficiency, unspecified: Secondary | ICD-10-CM | POA: Diagnosis not present

## 2023-05-19 DIAGNOSIS — G248 Other dystonia: Secondary | ICD-10-CM | POA: Diagnosis not present

## 2023-08-17 ENCOUNTER — Encounter: Payer: Self-pay | Admitting: Podiatry

## 2023-08-17 ENCOUNTER — Ambulatory Visit: Admitting: Podiatry

## 2023-08-17 ENCOUNTER — Ambulatory Visit (INDEPENDENT_AMBULATORY_CARE_PROVIDER_SITE_OTHER)

## 2023-08-17 DIAGNOSIS — M722 Plantar fascial fibromatosis: Secondary | ICD-10-CM | POA: Diagnosis not present

## 2023-08-17 MED ORDER — TRIAMCINOLONE ACETONIDE 10 MG/ML IJ SUSP
10.0000 mg | Freq: Once | INTRAMUSCULAR | Status: AC
Start: 2023-08-17 — End: 2023-08-17
  Administered 2023-08-17: 10 mg via INTRA_ARTICULAR

## 2023-08-17 MED ORDER — DICLOFENAC SODIUM 75 MG PO TBEC: 75.0000 mg | DELAYED_RELEASE_TABLET | Freq: Two times a day (BID) | ORAL | 2 refills | Status: AC

## 2023-08-18 NOTE — Progress Notes (Signed)
 Subjective:   Patient ID: Alyssa Goodman, female   DOB: 33 y.o.   MRN: [id number]   HPI Patient states my heels have really started to bother me more right over left and it is worse if I get up or wear and I said.  I did have about 10 months of relief   ROS      Objective:  Physical Exam  Patient is obese which is complicating factor with exquisite discomfort medial fascial band right over left reoccurrence over the last month     Assessment:  Acute plantar fasciitis bilateral with patient who did improve for period of time     Plan:  H&P reviewed and at this point I did do sterile prep I injected the plantar fascia bilateral 3 mg Kenalog  5 mg Xylocaine  applied sterile dressing and for the right I dispensed night splint with ice packs ace wrap with all instructions on sleeping in this and using it 2-3 times a day

## 2023-08-31 ENCOUNTER — Ambulatory Visit: Admitting: Podiatry

## 2023-09-25 ENCOUNTER — Ambulatory Visit: Admitting: Podiatry

## 2023-09-26 DIAGNOSIS — N898 Other specified noninflammatory disorders of vagina: Secondary | ICD-10-CM | POA: Diagnosis not present

## 2023-09-26 DIAGNOSIS — Z01419 Encounter for gynecological examination (general) (routine) without abnormal findings: Secondary | ICD-10-CM | POA: Diagnosis not present

## 2023-09-26 DIAGNOSIS — B009 Herpesviral infection, unspecified: Secondary | ICD-10-CM | POA: Diagnosis not present

## 2023-09-26 DIAGNOSIS — Z113 Encounter for screening for infections with a predominantly sexual mode of transmission: Secondary | ICD-10-CM | POA: Diagnosis not present

## 2023-12-25 ENCOUNTER — Ambulatory Visit: Admitting: Podiatry

## 2023-12-25 ENCOUNTER — Encounter: Payer: Self-pay | Admitting: Podiatry

## 2023-12-25 DIAGNOSIS — M722 Plantar fascial fibromatosis: Secondary | ICD-10-CM

## 2023-12-25 MED ORDER — TRIAMCINOLONE ACETONIDE 10 MG/ML IJ SUSP
10.0000 mg | Freq: Once | INTRAMUSCULAR | Status: AC
Start: 2023-12-25 — End: 2023-12-25
  Administered 2023-12-25: 10 mg via INTRA_ARTICULAR

## 2023-12-25 NOTE — Progress Notes (Signed)
 Subjective:   Patient ID: Alyssa Goodman, female   DOB: 33 y.o.   MRN: [id number]   HPI Patient states she started develop pain again with her right heel and states that its become more aggravating   ROS      Objective:  Physical Exam  Neuro vascular status intact reoccurrence of inflammation in the plantar fascia right     Assessment:  Acute plantar fasciitis right     Plan:  Sterile prep injected the fascia at insertion 3 mg Kenalog  5 mg Xylocaine  applied sterile dressing reappoint to recheck

## 2024-01-18 ENCOUNTER — Telehealth: Payer: Self-pay | Admitting: Podiatry

## 2024-01-18 DIAGNOSIS — Z0279 Encounter for issue of other medical certificate: Secondary | ICD-10-CM

## 2024-01-18 NOTE — Telephone Encounter (Signed)
 Pt will pay 25 at 01/22/24 visit. She wants accomm of sitting while at work. I adv her would email her what I fax to [employer]. This accomm will remain in effect until 03/06/24.

## 2024-01-22 ENCOUNTER — Encounter: Payer: Self-pay | Admitting: Podiatry

## 2024-01-22 ENCOUNTER — Ambulatory Visit (INDEPENDENT_AMBULATORY_CARE_PROVIDER_SITE_OTHER): Admitting: Podiatry

## 2024-01-22 DIAGNOSIS — F411 Generalized anxiety disorder: Secondary | ICD-10-CM | POA: Diagnosis not present

## 2024-01-22 DIAGNOSIS — M722 Plantar fascial fibromatosis: Secondary | ICD-10-CM | POA: Diagnosis not present

## 2024-01-22 MED ORDER — DICLOFENAC SODIUM 75 MG PO TBEC: 75.0000 mg | DELAYED_RELEASE_TABLET | Freq: Two times a day (BID) | ORAL | 2 refills | Status: AC

## 2024-01-22 NOTE — Progress Notes (Signed)
 Subjective:   Patient ID: Alyssa Goodman, female   DOB: 33 y.o.   MRN: [id number]   HPI Patient states that she still has an area in her plantar fascia right that is been very tender and does not seem to be improving   ROS      Objective:  Physical Exam  Neurovascular status intact with inflammation of the plantar fascia right more distal than it was previously     Assessment:  Plantar fasciitis chronic in nature right     Plan:  H&P reviewed sterile prep injected the plantar fascia right at insertion 3 mg Kenalog  5 mg Xylocaine  applied sterile dressing advised does report night splint and we [place] to get her a chair for work so that she does not have to be on her foot quite as much

## 2024-01-23 ENCOUNTER — Telehealth: Payer: Self-pay | Admitting: Podiatry

## 2024-01-23 NOTE — Telephone Encounter (Signed)
 Faxed [employer] [phone] and emailed pt a copy of what was faxed. Accommodation of sitting(chair) till 03/06/24
